# Patient Record
Sex: Male | Born: 1950
Health system: Southern US, Community
[De-identification: ages and names within clinical notes are randomized; demographics above are authoritative.]

## PROBLEM LIST (undated history)

## (undated) DIAGNOSIS — E785 Hyperlipidemia, unspecified: Secondary | ICD-10-CM

## (undated) DIAGNOSIS — N529 Male erectile dysfunction, unspecified: Secondary | ICD-10-CM

## (undated) DIAGNOSIS — M1990 Unspecified osteoarthritis, unspecified site: Secondary | ICD-10-CM

## (undated) DIAGNOSIS — K219 Gastro-esophageal reflux disease without esophagitis: Secondary | ICD-10-CM

## (undated) DIAGNOSIS — S022XXA Fracture of nasal bones, initial encounter for closed fracture: Secondary | ICD-10-CM

## (undated) DIAGNOSIS — I10 Essential (primary) hypertension: Secondary | ICD-10-CM

## (undated) DIAGNOSIS — B1920 Unspecified viral hepatitis C without hepatic coma: Secondary | ICD-10-CM

## (undated) HISTORY — DX: Fracture of nasal bones, initial encounter for closed fracture: S02.2XXA

## (undated) HISTORY — DX: Unspecified osteoarthritis, unspecified site: M19.90

## (undated) HISTORY — PX: INGUINAL HERNIA REPAIR: SHX194

## (undated) HISTORY — DX: Unspecified viral hepatitis C without hepatic coma: B19.20

## (undated) HISTORY — PX: FOOT SURGERY: SHX648

---

## 2011-08-10 DIAGNOSIS — R768 Other specified abnormal immunological findings in serum: Secondary | ICD-10-CM | POA: Insufficient documentation

## 2011-08-10 DIAGNOSIS — N411 Chronic prostatitis: Secondary | ICD-10-CM | POA: Insufficient documentation

## 2011-08-10 DIAGNOSIS — G5700 Lesion of sciatic nerve, unspecified lower limb: Secondary | ICD-10-CM | POA: Insufficient documentation

## 2011-08-10 DIAGNOSIS — M545 Low back pain: Secondary | ICD-10-CM | POA: Insufficient documentation

## 2011-11-27 DIAGNOSIS — B009 Herpesviral infection, unspecified: Secondary | ICD-10-CM | POA: Insufficient documentation

## 2012-02-28 LAB — LIPID PANEL
CHOLESTEROL: 144 mg/dL (ref 0–200)
HDL: 37 mg/dL (ref 35–70)
LDL Cholesterol: 83 mg/dL
TRIGLYCERIDES: 124 mg/dL (ref 40–160)

## 2012-02-28 LAB — HEPATIC FUNCTION PANEL
ALT: 36 U/L (ref 10–40)
AST: 37 U/L (ref 14–40)

## 2012-02-28 LAB — CBC AND DIFFERENTIAL
Hemoglobin: 18.1 g/dL — AB (ref 13.5–17.5)
Platelets: 129 10*3/uL — AB (ref 150–399)
WBC: 6.6 10^3/mL

## 2012-02-28 LAB — TSH: TSH: 1.45 u[IU]/mL (ref 0.41–5.90)

## 2012-02-28 LAB — BASIC METABOLIC PANEL
Creatinine: 1.1 mg/dL (ref 0.6–1.3)
Potassium: 4.4 mmol/L (ref 3.4–5.3)

## 2012-02-28 LAB — URIC ACID
GFR, Est African American: >60
GFR, Est Non African American: >60
Uric Acid: 7.9

## 2013-10-29 ENCOUNTER — Ambulatory Visit (INDEPENDENT_AMBULATORY_CARE_PROVIDER_SITE_OTHER): Payer: No Typology Code available for payment source | Admitting: Family Medicine

## 2013-10-29 ENCOUNTER — Encounter: Payer: Self-pay | Admitting: Family Medicine

## 2013-10-29 VITALS — BP 119/76 | HR 73 | Temp 97.9°F | Ht 67.6 in | Wt 188.0 lb

## 2013-10-29 DIAGNOSIS — E785 Hyperlipidemia, unspecified: Secondary | ICD-10-CM

## 2013-10-29 DIAGNOSIS — I10 Essential (primary) hypertension: Secondary | ICD-10-CM

## 2013-10-29 DIAGNOSIS — K649 Unspecified hemorrhoids: Secondary | ICD-10-CM

## 2013-10-29 DIAGNOSIS — K219 Gastro-esophageal reflux disease without esophagitis: Secondary | ICD-10-CM

## 2013-10-29 DIAGNOSIS — R252 Cramp and spasm: Secondary | ICD-10-CM

## 2013-10-29 DIAGNOSIS — N400 Enlarged prostate without lower urinary tract symptoms: Secondary | ICD-10-CM

## 2013-10-29 DIAGNOSIS — Z8619 Personal history of other infectious and parasitic diseases: Secondary | ICD-10-CM | POA: Insufficient documentation

## 2013-10-29 DIAGNOSIS — N529 Male erectile dysfunction, unspecified: Secondary | ICD-10-CM

## 2013-10-29 LAB — COMPLETE METABOLIC PANEL WITH GFR
ALBUMIN: 4.5 g/dL (ref 3.5–5.2)
ALT: 19 U/L (ref 0–53)
AST: 18 U/L (ref 0–37)
Alkaline Phosphatase: 106 U/L (ref 39–117)
BUN: 16 mg/dL (ref 6–23)
CALCIUM: 9.9 mg/dL (ref 8.4–10.5)
CHLORIDE: 97 meq/L (ref 96–112)
CO2: 28 mEq/L (ref 19–32)
Creat: 1.14 mg/dL (ref 0.50–1.35)
GFR, Est African American: 79 mL/min
GFR, Est Non African American: 68 mL/min
Glucose, Bld: 88 mg/dL (ref 70–99)
POTASSIUM: 3.9 meq/L (ref 3.5–5.3)
SODIUM: 134 meq/L — AB (ref 135–145)
Total Bilirubin: 0.6 mg/dL (ref 0.3–1.2)
Total Protein: 7.8 g/dL (ref 6.0–8.3)

## 2013-10-29 LAB — CBC
HEMATOCRIT: 43.4 % (ref 39.0–52.0)
HEMOGLOBIN: 14.8 g/dL (ref 13.0–17.0)
MCH: 24.7 pg — AB (ref 26.0–34.0)
MCHC: 34.1 g/dL (ref 30.0–36.0)
MCV: 72.5 fL — ABNORMAL LOW (ref 78.0–100.0)
Platelets: 163 10*3/uL (ref 150–400)
RBC: 5.99 MIL/uL — ABNORMAL HIGH (ref 4.22–5.81)
RDW: 16.4 % — ABNORMAL HIGH (ref 11.5–15.5)
WBC: 7.9 10*3/uL (ref 4.0–10.5)

## 2013-10-29 NOTE — Progress Notes (Signed)
Subjective:    Patient ID: Brandon [name], male    DOB: [birth year], 63 y.o.   MRN: [id number]  HPI Here to estab care.    Hypertension- Pt denies chest pain, SOB, dizziness, or heart palpitations.  Taking meds as directed w/o problems.  Denies medication side effects.    GERD - occ gets CP with it.  Using protonix for years.   Right sided flank pain x 1.5 years. . Says can be triggered by movement.  Can radiate in the axilla.  It can come on suddenly and can be very painful.  Rubbing it seems to help.  Does get a lot of cramping in his fee too.  History of GI ulcer.  Injured his back a few times while working for [employer]. Worked for them for almost 12 years.    Erectile dysfunction-saw his urologist last month. He then see him on Cialis 5 mg daily in addition to his Flomax for prostate and for return of function. He says the Cialis is costing him a most $200 out of pocket because his insurance doesn't cover it. So he's been taking it sporadically. He's been wondering why the medication doesn't seem to be working as well lately. He is having difficulty maintaining erection.  Review of Systems  HENT: Positive for hearing loss.   Cardiovascular: Positive for chest pain.  Gastrointestinal: Positive for blood in stool.  Genitourinary: Positive for flank pain.       Bleeding from hemorrhoid Urinates at night.  Leaking urine  Neurological: Positive for headaches.   BP 119/76  Pulse 73  Temp(Src) 97.9 F (36.6 C)  Ht 5' 7.6" (1.717 m)  Wt 188 lb (85.276 kg)  BMI 28.93 kg/m2    No Known Allergies  Past Medical History  Diagnosis Date  . Hepatitis C     [phone], treated at [facility]  . Nose fracture     Past Surgical History  Procedure Laterality Date  . Foot surgery Right     1980  . Foot surgery Left     1980  . Inguinal hernia repair Right     2009    History   Social History  . Marital Status: N/A    Spouse Name: Marzetta Merino    Number of Children: 2  . Years of  Education: N/A   Occupational History  . [employer]     Dept of [employer]   Social History Main Topics  . Smoking status: Former Smoker    Types: Cigarettes    Quit date: 10/22/1996  . Smokeless tobacco: Not on file  . Alcohol Use: No  . Drug Use: No  . Sexual Activity: Yes    Partners: Female   Other Topics Concern  . Not on file   Social History Narrative   No caffeine.  Occ exercise. Has a 2 years associate degree. Previously worked at [employer] for 12 years. Also work to [employer].    Family History  Problem Relation Age of Onset  . Breast cancer Sister   . Depression Sister   . Hyperlipidemia    . Ovarian cancer Sister   . Hypertension      Outpatient Encounter Prescriptions as of 10/29/2013  Medication Sig  . amLODipine (NORVASC) 5 MG tablet Take 5 mg by mouth daily.  [name] aspirin 81 MG tablet Take 81 mg by mouth daily.  . clindamycin (CLEOCIN T) 1 % lotion Apply topically 2 (two) times daily.  . clotrimazole-betamethasone (LOTRISONE) cream   .  cyanocobalamin 500 MCG tablet Take 500 mcg by mouth daily.  . flunisolide (NASALIDE) 25 MCG/ACT (0.025%) SOLN Place 2 sprays into the nose 2 (two) times daily.  . hydrocortisone (ANUSOL-HC) 25 MG suppository Place 25 mg rectally 2 (two) times daily.  . hydrocortisone 2.5 % cream Apply topically 2 (two) times daily.  [name] losartan-hydrochlorothiazide (HYZAAR) 100-25 MG per tablet   . Naftifine HCl 1 % GEL Apply topically.  . Omega-3 Fatty Acids (FISH OIL) 1200 MG CAPS Take by mouth.  . pantoprazole (PROTONIX) 40 MG tablet   . tadalafil (CIALIS) 5 MG tablet Take 5 mg by mouth daily as needed for erectile dysfunction.  . tamsulosin (FLOMAX) 0.4 MG CAPS capsule Take 0.4 mg by mouth.           Objective:   Physical Exam  Constitutional: He is oriented to person, place, and time. He appears well-developed and well-nourished.  HENT:  Head: Normocephalic and atraumatic.  Right Ear: External ear normal.  Left Ear:  External ear normal.  Eyes: Conjunctivae are normal.  Neck: Neck supple. No thyromegaly present.  Cardiovascular: Normal rate, regular rhythm and normal heart sounds.   No carotid bruits   Pulmonary/Chest: Effort normal and breath sounds normal.  Musculoskeletal: He exhibits no edema.  Nontender over the thoracic spine. Shoulders with normal range of motion. Normal rotation at the waist. Nontender over the right side under the axilla and over the ribs.  Lymphadenopathy:    He has no cervical adenopathy.  Neurological: He is alert and oriented to person, place, and time.  Skin: Skin is warm and dry.  Psychiatric: He has a normal mood and affect. His behavior is normal. Judgment and thought content normal.          Assessment & Plan:  Hypertension- well controlled. Continue current regimen. Followup in 6 months.  BPH-he does see urology. Last appointment was last month. He is currently on Cialis 5 mg daily as well as Flomax.  GERD - currently well controlled on pantoprazole. He admits occasionally dietary indiscretions to cause his symptoms to flare.  Right side pain - I. really think this could be coming from his thoracic spine or just part of the muscle cramping that he gets also in his hands and his feet. Encouraged him to try B6 daily to see if this makes a difference. Can also try magnesium daily. I will go ahead and check electrolytes as well as a CK as well.  msucle cramps - Will check Thyroid and CK. Recommend a trial of B6 and magnesium to see if this helps.  ED-discussed that Cialis is taken on a daily regimen works best when actually taken daily. It often takes 3-5 days of daily use to even see the actual effects of the medication. I suspect since she's been taking it sporadically to save money it is just not being as effective. We'll need to take daily. Since he does pay out of pocket for it I encouraged him to call around to different pharmacies that they do offer the  medication and different prices to see who offers the best price.

## 2013-10-29 NOTE — Patient Instructions (Signed)
You can try vitamin B6 and magnesium for the cramping and see if it makes a difference.

## 2013-10-30 LAB — FERRITIN: Ferritin: 15 ng/mL — ABNORMAL LOW (ref 22–322)

## 2013-10-30 LAB — CK: Total CK: 180 U/L (ref 7–232)

## 2013-10-30 LAB — TSH: TSH: 0.79 u[IU]/mL (ref 0.350–4.500)

## 2013-11-21 ENCOUNTER — Encounter: Payer: Self-pay | Admitting: Emergency Medicine

## 2013-11-21 ENCOUNTER — Emergency Department (INDEPENDENT_AMBULATORY_CARE_PROVIDER_SITE_OTHER)
Admission: EM | Admit: 2013-11-21 | Discharge: 2013-11-21 | Disposition: A | Discharge status: Home / Self Care | Payer: No Typology Code available for payment source | Source: Home / Self Care | Attending: Family Medicine | Admitting: Family Medicine

## 2013-11-21 DIAGNOSIS — R42 Dizziness and giddiness: Secondary | ICD-10-CM

## 2013-11-21 DIAGNOSIS — R5383 Other fatigue: Principal | ICD-10-CM

## 2013-11-21 DIAGNOSIS — R5381 Other malaise: Secondary | ICD-10-CM

## 2013-11-21 HISTORY — DX: Gastro-esophageal reflux disease without esophagitis: K21.9

## 2013-11-21 HISTORY — DX: Male erectile dysfunction, unspecified: N52.9

## 2013-11-21 HISTORY — DX: Essential (primary) hypertension: I10

## 2013-11-21 NOTE — ED Provider Notes (Signed)
CSN: [id number]     Arrival date & time 11/21/13  1307 History   First MD Initiated Contact with Patient 11/21/13 1336     Chief Complaint  Patient presents with  . Dizziness  . Weakness  . Nausea     HPI Comments: Patient complains of a 3 week history of occasional intermittent brief episodes of weakness, dizziness, nausea (without vomiting), ataxia, and and feeling incoordinated.  The episodes typically last about 5 to 10 minutes before subsiding.  He has been having 2 to 3 per week.  He sometimes has a vague sensation in his chest but he notes that he has a history of GERD.  Today he developed an episode about 3 hours ago that has been slower to resolve.  He is essentially assymptomatic at present. Family history includes hypertension in his mother and father, CHF in father; several cousins with heart problems.  The history is provided by the patient.    Past Medical History  Diagnosis Date  . Hepatitis C     [phone], treated at [facility]  . Nose fracture   . Hypertension   . GERD (gastroesophageal reflux disease)   . ED (erectile dysfunction)    Past Surgical History  Procedure Laterality Date  . Foot surgery Right     1980  . Foot surgery Left     1980  . Inguinal hernia repair Right     2009   Family History  Problem Relation Age of Onset  . Breast cancer Sister   . Depression Sister   . Hyperlipidemia    . Ovarian cancer Sister   . Hypertension     History  Substance Use Topics  . Smoking status: Former Smoker    Types: Cigarettes    Quit date: 10/22/1996  . Smokeless tobacco: Not on file  . Alcohol Use: No    Review of Systems  Constitutional: Negative.   HENT: Negative.   Eyes: Negative.   Respiratory: Negative.   Cardiovascular: Negative.   Gastrointestinal: Positive for nausea. Negative for vomiting.  Endocrine: Negative.   Genitourinary: Negative.   Musculoskeletal: Negative.   Skin: Negative.   Neurological: Positive for dizziness. Negative for  tremors, seizures, syncope, facial asymmetry, speech difficulty, weakness, light-headedness, numbness and headaches.    Allergies  Review of patient's allergies indicates no known allergies.  Home Medications   Current Outpatient Rx  Name  Route  Sig  Dispense  Refill  . amLODipine (NORVASC) 5 MG tablet   Oral   Take 5 mg by mouth daily.         [name] aspirin 81 MG tablet   Oral   Take 81 mg by mouth daily.         . clindamycin (CLEOCIN T) 1 % lotion   Topical   Apply topically 2 (two) times daily.         . clotrimazole-betamethasone (LOTRISONE) cream               . cyanocobalamin 500 MCG tablet   Oral   Take 500 mcg by mouth daily.         . flunisolide (NASALIDE) 25 MCG/ACT (0.025%) SOLN   Nasal   Place 2 sprays into the nose 2 (two) times daily.         . hydrocortisone (ANUSOL-HC) 25 MG suppository   Rectal   Place 25 mg rectally 2 (two) times daily.         . hydrocortisone 2.5 % cream  Topical   Apply topically 2 (two) times daily.         [name] losartan-hydrochlorothiazide (HYZAAR) 100-25 MG per tablet               . Naftifine HCl 1 % GEL   Apply externally   Apply topically.         . Omega-3 Fatty Acids (FISH OIL) 1200 MG CAPS   Oral   Take by mouth.         . pantoprazole (PROTONIX) 40 MG tablet               . tadalafil (CIALIS) 5 MG tablet   Oral   Take 5 mg by mouth daily as needed for erectile dysfunction.         . tamsulosin (FLOMAX) 0.4 MG CAPS capsule   Oral   Take 0.4 mg by mouth.          BP 125/74  Pulse 67  Temp(Src) 98 F (36.7 C) (Oral)  Resp 16  Ht 5' 7.5" (1.715 m)  Wt 187 lb 8 oz (85.049 kg)  BMI 28.92 kg/m2  SpO2 98% Physical Exam Nursing notes and Vital Signs reviewed. Appearance:  Patient appears healthy, stated age, and in no acute distress Eyes:  Pupils are equal, round, and reactive to light and accomodation.  Extraocular movement is intact.  Conjunctivae are not inflamed.  Fundi  are benign Ears:  Canals normal.  Tympanic membranes normal.  Nose:  Normal turbinates.  No sinus tenderness.    Mouth:  Normal Pharynx:  Normal Neck:  Supple.  No adenopathy.  No thyromegaly.  Carotids have normal upstrokes without bruits. Lungs:  Clear to auscultation.  Breath sounds are equal.  Heart:  Regular rate and rhythm without murmurs, rubs, or gallops.  Abdomen:  Nontender without masses or hepatosplenomegaly.  Bowel sounds are present.  No CVA or flank tenderness.  Extremities:  No edema.  No calf tenderness Skin:  No rash present.   Neurologic:  Cranial nerves 2 through 12 are normal.  Patellar, achilles, and elbow reflexes are normal.  Cerebellar function is intact (finger-to-nose and rapid alternating hand movement).  Gait and station are normal.  Negative cerebellar sign   ED Course  Procedures  None  EKG:   Rate 56 PR:  0.158 QT:  0.404 QR:  0.92 QRS axis:7 degrees Interpretation:  Sinus bradycardia; otherwise within normal limits          MDM   1. Other malaise and fatigue   2. Dizziness and giddiness; concerned about possible transient episodes of cerebrobasilar ischemia.    Continue low dose daily aspirin.  Continue Protonix. Recommend follow-up with PCP for further evaluation as soon as possible. If symptoms become significantly worse during the night or over the weekend, proceed to the local emergency room.   Kandra Nicolas, MD 11/23/13 [phone]

## 2013-11-21 NOTE — ED Notes (Signed)
Gives 3 week history of intermittent dizziness followed by weakness, slight tremors, and nausea. Denies chest pain or history of. Has had ECG within  past year.

## 2013-11-23 ENCOUNTER — Encounter: Payer: Self-pay | Admitting: Family Medicine

## 2013-11-23 DIAGNOSIS — K649 Unspecified hemorrhoids: Secondary | ICD-10-CM | POA: Insufficient documentation

## 2013-11-23 NOTE — Discharge Instructions (Signed)
Continue low dose daily aspirin.  Continue Protonix. If symptoms become significantly worse during the night or over the weekend, proceed to the local emergency room.

## 2013-11-26 ENCOUNTER — Encounter: Payer: Self-pay | Admitting: Family Medicine

## 2013-11-26 ENCOUNTER — Ambulatory Visit (INDEPENDENT_AMBULATORY_CARE_PROVIDER_SITE_OTHER): Payer: No Typology Code available for payment source | Admitting: Family Medicine

## 2013-11-26 VITALS — BP 112/71 | HR 76 | Temp 97.3°F | Ht 67.6 in | Wt 188.0 lb

## 2013-11-26 DIAGNOSIS — R42 Dizziness and giddiness: Secondary | ICD-10-CM

## 2013-11-26 DIAGNOSIS — R350 Frequency of micturition: Secondary | ICD-10-CM

## 2013-11-26 DIAGNOSIS — I10 Essential (primary) hypertension: Secondary | ICD-10-CM

## 2013-11-26 LAB — POCT UA - MICROALBUMIN
Albumin/Creatinine Ratio, Urine, POC: <30
CREATININE, POC: 100 mg/dL
MICROALBUMIN (UR) POC: 10 mg/L

## 2013-11-26 LAB — POCT GLYCOSYLATED HEMOGLOBIN (HGB A1C): HEMOGLOBIN A1C: 4.8

## 2013-11-26 NOTE — Progress Notes (Signed)
CC: Brandon [name] is a 63 y.o. male is here for Dizziness   Subjective: HPI:  Complains of episodes involving sensation of dizziness and entire body weakness that occur 2-3 times a week. This is been present for the last month and has not been getting better or worsens onset. They always occur when he is stationary and sitting usually at work. They come on gradually and will resolve after a few minutes however resolution is expedited with eating snacks from a vending machine. He works out most days of the week and denies having episodes during physical exertion. Describes dizziness only as dizziness and weakness or has weakness both mild in severity. He's never had this before. It can occur anytime of the day but occurs mostly at work.  Denies chest pain, shortness of breath, orthopnea, peripheral edema, decreased appetite, diarrhea, abdominal pain, fevers or chills, nor any other motor or sensory disturbances   Review Of Systems Outlined In HPI  Past Medical History  Diagnosis Date  . Hepatitis C     [phone], treated at [facility]  . Nose fracture   . Hypertension   . GERD (gastroesophageal reflux disease)   . ED (erectile dysfunction)     Past Surgical History  Procedure Laterality Date  . Foot surgery Right     1980  . Foot surgery Left     1980  . Inguinal hernia repair Right     2009   Family History  Problem Relation Age of Onset  . Breast cancer Sister   . Depression Sister   . Hyperlipidemia    . Ovarian cancer Sister   . Hypertension      History   Social History  . Marital Status: Married    Spouse Name: Jodi Kappes    Number of Children: 2  . Years of Education: N/A   Occupational History  . [employer] Rep     Dept of [employer]   Social History Main Topics  . Smoking status: Former Smoker    Types: Cigarettes    Quit date: 10/22/1996  . Smokeless tobacco: Not on file  . Alcohol Use: No  . Drug Use: No  . Sexual Activity: Yes   Partners: Female   Other Topics Concern  . Not on file   Social History Narrative   No caffeine.  Occ exercise. Has a 2 years associate degree. Previously worked at [employer] for 12 years. Also work to [employer].     Objective: BP 112/71  Pulse 76  Temp(Src) 97.3 F (36.3 C)  Ht 5' 7.6" (1.717 m)  Wt 188 lb (85.276 kg)  BMI 28.93 kg/m2  SpO2 98%  General: Alert and Oriented, No Acute Distress HEENT: Pupils equal, round, reactive to light. Conjunctivae clear.  External ears unremarkable, canals clear with intact TMs with appropriate landmarks.  Middle ear appears open without effusion. Pink inferior turbinates.  Moist mucous membranes, pharynx without inflammation nor lesions.  Neck supple without palpable lymphadenopathy nor abnormal masses. Lungs: Clear to auscultation bilaterally, no wheezing/ronchi/rales.  Comfortable work of breathing. Good air movement. Cardiac: Regular rate and rhythm. Normal S1/S2.  No murmurs, rubs, nor gallops.   Neuro: Cranial nerves II through XII grossly intact Extremities: No peripheral edema.  Strong peripheral pulses.  Mental Status: No depression, anxiety, nor agitation. Skin: Warm and dry.  Assessment & Plan: Markeith was seen today for dizziness.  Diagnoses and associated orders for this visit:  Dizzy - POCT glycosylated hemoglobin (Hb A1C)  Urinary frequency -  POCT UA - Microalbumin  Essential hypertension, benign    Dizziness: EKG reviewed from last week without any arrhythmia. A1c was obtained today which was unremarkable, spot blood sugar in January was unremarkable as well. Discussed low suspicion of hypoglycemic episodes, arrhythmia, or metabolic abnormality given recent objective labs and data. Suspect most likely cause would be relative hypotension at rest since symptoms do improve with ingestion of salty snacks. Decreasing amlodipine followup in the next 2-4 weeks.  Return in about 4 weeks (around 12/24/2013).

## 2013-12-03 ENCOUNTER — Encounter: Payer: Self-pay | Admitting: Family Medicine

## 2013-12-03 DIAGNOSIS — K7400 Hepatic fibrosis, unspecified: Secondary | ICD-10-CM | POA: Insufficient documentation

## 2013-12-03 DIAGNOSIS — B351 Tinea unguium: Secondary | ICD-10-CM | POA: Insufficient documentation

## 2013-12-03 DIAGNOSIS — E559 Vitamin D deficiency, unspecified: Secondary | ICD-10-CM | POA: Insufficient documentation

## 2013-12-03 DIAGNOSIS — E538 Deficiency of other specified B group vitamins: Secondary | ICD-10-CM | POA: Insufficient documentation

## 2013-12-03 DIAGNOSIS — M109 Gout, unspecified: Secondary | ICD-10-CM | POA: Insufficient documentation

## 2013-12-13 ENCOUNTER — Emergency Department (INDEPENDENT_AMBULATORY_CARE_PROVIDER_SITE_OTHER): Payer: No Typology Code available for payment source

## 2013-12-13 ENCOUNTER — Encounter: Payer: Self-pay | Admitting: Emergency Medicine

## 2013-12-13 ENCOUNTER — Emergency Department (INDEPENDENT_AMBULATORY_CARE_PROVIDER_SITE_OTHER)
Admission: EM | Admit: 2013-12-13 | Discharge: 2013-12-13 | Disposition: A | Discharge status: Home / Self Care | Payer: No Typology Code available for payment source | Source: Home / Self Care | Attending: Emergency Medicine | Admitting: Emergency Medicine

## 2013-12-13 DIAGNOSIS — M25569 Pain in unspecified knee: Secondary | ICD-10-CM

## 2013-12-13 DIAGNOSIS — S83419A Sprain of medial collateral ligament of unspecified knee, initial encounter: Secondary | ICD-10-CM

## 2013-12-13 MED ORDER — MELOXICAM 7.5 MG PO TABS: ORAL_TABLET | ORAL | Status: DC

## 2013-12-13 NOTE — ED Notes (Signed)
Pt twisted his left knee 2 days ago.  Used heat and ibuprofen yesterday with some relief.  Hurts to get up and to walk 7/10 pain described as sharp.  Denies previous injury.

## 2013-12-13 NOTE — ED Provider Notes (Signed)
CSN: [id number]     Arrival date & time 12/13/13  1343 History   First MD Initiated Contact with Patient 12/13/13 1400     Chief Complaint  Patient presents with  . Knee Injury    L    The history is provided by the patient.  Pt twisted his left knee 2 days ago. Used heat and ibuprofen yesterday with some relief. Hurts to get up and to walk 7/10 pain described as sharp. Denies previous injury.  The left knee pain is sharp, intensity 7/10. Exacerbated by movement. He is able to weight-bear. The knee has not given out. Ibuprofen 800 mg helps somewhat The pain does not radiate . No focal numbness or weakness Past Medical History  Diagnosis Date  . Hepatitis C     [phone], treated at [facility]  . Nose fracture   . Hypertension   . GERD (gastroesophageal reflux disease)   . ED (erectile dysfunction)    Past Surgical History  Procedure Laterality Date  . Foot surgery Right     1980  . Foot surgery Left     1980  . Inguinal hernia repair Right     2009   Family History  Problem Relation Age of Onset  . Breast cancer Sister   . Depression Sister   . Hyperlipidemia    . Ovarian cancer Sister   . Hypertension     History  Substance Use Topics  . Smoking status: Former Smoker    Types: Cigarettes    Quit date: 10/22/1996  . Smokeless tobacco: Not on file  . Alcohol Use: No    Review of Systems  All other systems reviewed and are negative.      Allergies  Review of patient's allergies indicates no known allergies.  Home Medications   Current Outpatient Rx  Name  Route  Sig  Dispense  Refill  . amLODipine (NORVASC) 5 MG tablet   Oral   Take 0.5 tablets (2.5 mg total) by mouth daily.   30 tablet   0   . aspirin 81 MG tablet   Oral   Take 81 mg by mouth daily.         . clindamycin (CLEOCIN T) 1 % lotion   Topical   Apply topically 2 (two) times daily.         . clotrimazole-betamethasone (LOTRISONE) cream               . cyanocobalamin 500 MCG  tablet   Oral   Take 500 mcg by mouth daily.         . flunisolide (NASALIDE) 25 MCG/ACT (0.025%) SOLN   Nasal   Place 2 sprays into the nose 2 (two) times daily.         . hydrocortisone (ANUSOL-HC) 25 MG suppository   Rectal   Place 25 mg rectally 2 (two) times daily.         . hydrocortisone 2.5 % cream   Topical   Apply topically 2 (two) times daily.         [name] losartan-hydrochlorothiazide (HYZAAR) 100-25 MG per tablet               . meloxicam (MOBIC) 7.5 MG tablet      Take 1 twice a day as needed for pain. Take with food. (Do not take with any other NSAID.)   20 tablet   0   . Naftifine HCl 1 % GEL   Apply externally   Apply  topically.         . Omega-3 Fatty Acids (FISH OIL) 1200 MG CAPS   Oral   Take by mouth.         . pantoprazole (PROTONIX) 40 MG tablet               . tadalafil (CIALIS) 5 MG tablet   Oral   Take 5 mg by mouth daily as needed for erectile dysfunction.         . tamsulosin (FLOMAX) 0.4 MG CAPS capsule   Oral   Take 0.4 mg by mouth.          BP 128/84  Pulse 79  Temp(Src) 98 F (36.7 C) (Oral)  Ht 5\' 7"  (1.702 m)  Wt 195 lb 4 oz (88.565 kg)  BMI 30.57 kg/m2  SpO2 99% Physical Exam  Nursing note and vitals reviewed. Constitutional: He is oriented to person, place, and time. He appears well-developed and well-nourished. No distress.  HENT:  Head: Normocephalic and atraumatic.  Eyes: Conjunctivae and EOM are normal. Pupils are equal, round, and reactive to light. No scleral icterus.  Neck: Normal range of motion.  Cardiovascular: Normal rate.   Pulmonary/Chest: Effort normal.  Abdominal: He exhibits no distension.  Musculoskeletal:       Left knee: He exhibits decreased range of motion and swelling. He exhibits no ecchymosis, no deformity, no laceration, no erythema, normal patellar mobility and no MCL laxity. Tenderness found. Medial joint line and MCL tenderness noted. No lateral joint line, no LCL and no  patellar tendon tenderness noted.  McMurray's sign negative. No instability. Lachman sign negative  Neurovascular distally intact  Neurological: He is alert and oriented to person, place, and time.  Skin: Skin is warm. No rash noted.  Psychiatric: He has a normal mood and affect.   No calf tenderness or swelling or cords or heat or edema ED Course  Procedures (including critical care time) Labs Review Labs Reviewed - No data to display Imaging Review Dg Knee Complete 4 Views Left  12/13/2013   CLINICAL DATA:  Twisting injury to the left knee 2 days ago, persistent pain.  EXAM: LEFT KNEE - COMPLETE 4+ VIEW  COMPARISON:  None.  FINDINGS: No evidence of acute fracture or dislocation. Well preserved joint spaces. Well preserved bone mineral density. Mild enthesopathic spurring at the insertion of the quadriceps tendon on the superior patella. Mild enthesopathic spurring at the insertion of the patellar tendon on the tibial tubercle and the inferior patella.  IMPRESSION: No acute or significant abnormality.   Electronically Signed   By: Evangeline Dakin M.D.   On: 12/13/2013 14:39      MDM   Final diagnoses:  Medial collateral ligament sprain of knee   left knee  X-ray left knee negative. No acute or significant bony abnormality  Treatment options discussed, as well as risks, benefits, alternatives. Patient voiced understanding and agreement with the following plans: Ice elevation rest Elastic knee sleeve Mobic for pain relief Followup with orthopedist if no better one week, sooner if worse or new symptoms Precautions discussed. Red flags discussed. Questions invited and answered. Patient voiced understanding and agreement.     Jacqulyn Cane, MD 12/13/13 [phone]

## 2014-01-06 ENCOUNTER — Encounter: Payer: Self-pay | Admitting: *Deleted

## 2014-02-04 ENCOUNTER — Ambulatory Visit (INDEPENDENT_AMBULATORY_CARE_PROVIDER_SITE_OTHER): Payer: No Typology Code available for payment source | Admitting: Family Medicine

## 2014-02-04 ENCOUNTER — Encounter: Payer: Self-pay | Admitting: Family Medicine

## 2014-02-04 VITALS — BP 119/68 | HR 63 | Wt 191.0 lb

## 2014-02-04 DIAGNOSIS — R3915 Urgency of urination: Secondary | ICD-10-CM

## 2014-02-04 DIAGNOSIS — M549 Dorsalgia, unspecified: Secondary | ICD-10-CM

## 2014-02-04 LAB — POCT URINALYSIS DIPSTICK
Bilirubin, UA: NEGATIVE
Blood, UA: NEGATIVE
Glucose, UA: NEGATIVE
Ketones, UA: NEGATIVE
Leukocytes, UA: NEGATIVE
NITRITE UA: NEGATIVE
PH UA: 6.5
Protein, UA: NEGATIVE
Spec Grav, UA: 1.015
Urobilinogen, UA: 0.2

## 2014-02-04 NOTE — Progress Notes (Signed)
   Subjective:    Patient ID: Brandon [name], male    DOB: 07/13/1951, 63 y.o.   MRN: [id number]  HPI Has had some low back pain for 4 days.  Had noticed some inc urination. Yesterday was goind every 5 min. Says does drinks a lot of water.  Says no blood in the urine. No fever, chills, or sweats. No hx of renal stones. Hx of BPH.   Says that he says the urinary frequency is actually a lot better today. The back pain is also significantly better but he still just a little sore.   Review of Systems     Objective:   Physical Exam  Constitutional: He is oriented to person, place, and time. He appears well-developed and well-nourished.  HENT:  Head: Normocephalic and atraumatic.  Abdominal: Soft. Bowel sounds are normal. He exhibits no distension and no mass. There is no tenderness. There is no rebound and no guarding.  Musculoskeletal:  Lump lumbar spine with normal flexion, extension, rotation right and left, side bending. He is a little bit tender of the very lower end of the lumbar spine and also little bit tender just lateral to the SI joint on the left side. Negative straight leg raise bilaterally. Hip, knee, ankle strength is 5 out of 5 bilaterally. Patellar reflexes 1+ bilaterally.  Neurological: He is alert and oriented to person, place, and time.  Skin: Skin is warm and dry.  Psychiatric: He has a normal mood and affect. His behavior is normal.          Assessment & Plan:  Low back pain- he actually has some tenderness on exam. Suspect this is more musculoskeletal though there could be related to the urinary frequency. He has not had any severe pain or blood in the urine to suggest a renal stone. He's not had any changes with his bowel movements. Urinalysis was negative today. We'll send for culture for confirmation. Consider this could also be some mild prostatitis, they usually it does not improve as quickly.  Urinary urgency-unclear etiology. It seems to have improved  significantly today. Consider kidney stone versus prostatitis versus UTI. His urine dipstick was negative but we will send for culture for further evaluation.

## 2014-03-27 ENCOUNTER — Encounter: Payer: Self-pay | Admitting: Emergency Medicine

## 2014-03-27 ENCOUNTER — Emergency Department (INDEPENDENT_AMBULATORY_CARE_PROVIDER_SITE_OTHER)
Admission: EM | Admit: 2014-03-27 | Discharge: 2014-03-27 | Disposition: A | Discharge status: Home / Self Care | Payer: No Typology Code available for payment source | Source: Home / Self Care | Attending: Family Medicine | Admitting: Family Medicine

## 2014-03-27 DIAGNOSIS — R300 Dysuria: Secondary | ICD-10-CM

## 2014-03-27 DIAGNOSIS — N4889 Other specified disorders of penis: Secondary | ICD-10-CM

## 2014-03-27 DIAGNOSIS — N489 Disorder of penis, unspecified: Secondary | ICD-10-CM

## 2014-03-27 LAB — POCT URINALYSIS DIP (MANUAL ENTRY)
Bilirubin, UA: NEGATIVE
Glucose, UA: NEGATIVE
Ketones, POC UA: NEGATIVE
Leukocytes, UA: NEGATIVE
Nitrite, UA: NEGATIVE
Protein Ur, POC: NEGATIVE
RBC UA: NEGATIVE
Spec Grav, UA: 1.01
Urobilinogen, UA: 0.2
pH, UA: 6

## 2014-03-27 NOTE — ED Notes (Signed)
Pt complains of rash on his penis started 1 week ago.  Described as red raised and sometimes itches. Denies new sexual partners. Says frequency of urination has increased but does not hurt.  Says he "feels his urine coming out" more than usual almost a burning.

## 2014-03-27 NOTE — ED Provider Notes (Signed)
CSN: [id number]     Arrival date & time 03/27/14  1230 History   First MD Initiated Contact with Patient 03/27/14 1248     Chief Complaint  Patient presents with  . Rash    penis      HPI Comments: One week ago patient noticed three small painless bumps on his penis (they do itch at times). They were not vesicles initially, and have not increased in size.  Clotrimazole cream has had no effect.  He has a past history of genital warts while in the [place] that were treated with cryotherapy.  He also complains of mild urinary frequency and minimal dysuria.  No urethral discharge.  He states that he has had similar urine symptoms in past but urinalysis has always been negative.  He states that about one month ago his [place] started him on prophylactic acyclovir 800mg , one-half tab daily.  He feels well otherwise.  He denies new sexual partners.  Patient is a 63 y.o. male presenting with rash. The history is provided by the patient.  Rash Pain location: penis. Pain quality comment:  Itching Pain severity:  Mild Onset quality:  Gradual Duration:  1 week Timing:  Constant Chronicity:  New Relieved by:  Nothing Worsened by:  Nothing tried Ineffective treatments: clotrimazole cream. Associated symptoms: no chills, no fatigue, no hematuria and no nausea   Associated symptoms comment:  Mild dysuria   Past Medical History  Diagnosis Date  . Hepatitis C     [phone], treated at [facility]  . Nose fracture   . Hypertension   . GERD (gastroesophageal reflux disease)   . ED (erectile dysfunction)    Past Surgical History  Procedure Laterality Date  . Foot surgery Right     1980  . Foot surgery Left     1980  . Inguinal hernia repair Right     2009   Family History  Problem Relation Age of Onset  . Breast cancer Sister   . Depression Sister   . Hyperlipidemia    . Ovarian cancer Sister   . Hypertension     History  Substance Use Topics  . Smoking status: Former Smoker   Types: Cigarettes    Quit date: 10/22/1996  . Smokeless tobacco: Not on file  . Alcohol Use: No    Review of Systems  Constitutional: Negative for chills and fatigue.  Gastrointestinal: Negative for nausea.  Genitourinary: Negative for hematuria.  Skin: Positive for rash.    Allergies  Review of patient's allergies indicates no known allergies.  Home Medications   Prior to Admission medications   Medication Sig Start Date End Date Taking? Authorizing Provider  acyclovir (ZOVIRAX) 800 MG tablet  01/14/14   Historical Provider, MD  AMBULATORY NON FORMULARY MEDICATION Medication Name: Metabolix co Q 10    Historical Provider, MD  AMBULATORY NON FORMULARY MEDICATION Medication Name: vit D with probiotic    Historical Provider, MD  AMBULATORY NON FORMULARY MEDICATION Medication Name: daily defense    Historical Provider, MD  amLODipine (NORVASC) 5 MG tablet Take 0.5 tablets (2.5 mg total) by mouth daily. 11/26/13   Marcial Pacas, DO  aspirin 81 MG tablet Take 81 mg by mouth daily.    Historical Provider, MD  clindamycin (CLEOCIN T) 1 % lotion Apply topically 2 (two) times daily.    Historical Provider, MD  clotrimazole-betamethasone (LOTRISONE) cream  09/22/13   Historical Provider, MD  cyanocobalamin 500 MCG tablet Take 500 mcg by mouth daily.  Historical Provider, MD  flunisolide (NASALIDE) 25 MCG/ACT (0.025%) SOLN Place 2 sprays into the nose 2 (two) times daily.    Historical Provider, MD  hydrocortisone (ANUSOL-HC) 25 MG suppository Place 25 mg rectally 2 (two) times daily.    Historical Provider, MD  hydrocortisone 2.5 % cream Apply topically 2 (two) times daily.    Historical Provider, MD  losartan-hydrochlorothiazide Konrad Penta) 100-25 MG per tablet  09/15/13   Historical Provider, MD  meloxicam (MOBIC) 7.5 MG tablet Take 1 twice a day as needed for pain. Take with food. (Do not take with any other NSAID.) 12/13/13   Jacqulyn Cane, MD  Naftifine HCl 1 % GEL Apply topically.    Historical  Provider, MD  Omega-3 Fatty Acids (FISH OIL) 1200 MG CAPS Take by mouth.    Historical Provider, MD  pantoprazole (PROTONIX) 40 MG tablet  09/15/13   Historical Provider, MD  tadalafil (CIALIS) 5 MG tablet Take 5 mg by mouth daily as needed for erectile dysfunction.    Historical Provider, MD  tamsulosin (FLOMAX) 0.4 MG CAPS capsule Take 0.4 mg by mouth.    Historical Provider, MD   BP 131/77  Pulse 59  Temp(Src) 97.9 F (36.6 C) (Oral)  Ht 5' 7.5" (1.715 m)  Wt 187 lb (84.823 kg)  BMI 28.84 kg/m2  SpO2 99% Physical Exam  Nursing note and vitals reviewed. Constitutional: He is oriented to person, place, and time. He appears well-developed and well-nourished. No distress.  HENT:  Head: Atraumatic.  Eyes: Conjunctivae are normal. Pupils are equal, round, and reactive to light.  Genitourinary: Testes normal.    No penile erythema or penile tenderness. No discharge found.  Distal penile shaft has three small 23mm diameter smooth flat- topped lesions without erythema or tenderness.  Neurological: He is alert and oriented to person, place, and time.  Skin: Skin is warm and dry.    ED Course  Procedures  none    Labs Reviewed  GC/CHLAMYDIA PROBE AMP, URINE  POCT URINALYSIS DIP (MANUAL ENTRY):  negative         MDM   1. Dysuria; note normal urinalysis  2. Penile lesion; ?early genital warts (lesions too small and non-specific to definitively diagnose)   Patient reassured that lesions are not HSV.   Monitor lesions for now.  If bumps increase in size or new ones appear, followup with dermatologist or PCP for cryotherapy. GC/chlamydia pending    Kandra Nicolas, MD 03/27/14 [phone]

## 2014-03-27 NOTE — Discharge Instructions (Signed)
Monitor lesions for now.  If bumps increase in size or new ones appear, followup with dermatologist or PCP for cryotherapy.

## 2014-03-29 ENCOUNTER — Telehealth: Payer: Self-pay | Admitting: *Deleted

## 2014-03-29 LAB — GC/CHLAMYDIA PROBE AMP, URINE
Chlamydia, Swab/Urine, PCR: NEGATIVE
GC Probe Amp, Urine: NEGATIVE

## 2014-04-14 ENCOUNTER — Other Ambulatory Visit: Payer: Self-pay

## 2014-04-14 MED ORDER — PANTOPRAZOLE SODIUM 40 MG PO TBEC: 40.0000 mg | DELAYED_RELEASE_TABLET | Freq: Every day | ORAL | Status: DC

## 2014-04-14 MED ORDER — LOSARTAN POTASSIUM-HCTZ 100-25 MG PO TABS: 1.0000 | ORAL_TABLET | Freq: Every day | ORAL | Status: DC

## 2014-04-14 MED ORDER — AMLODIPINE BESYLATE 5 MG PO TABS: 5.0000 mg | ORAL_TABLET | Freq: Every day | ORAL | Status: DC

## 2014-04-15 ENCOUNTER — Encounter: Payer: Self-pay | Admitting: Family Medicine

## 2014-04-15 ENCOUNTER — Ambulatory Visit (INDEPENDENT_AMBULATORY_CARE_PROVIDER_SITE_OTHER): Payer: No Typology Code available for payment source | Admitting: Family Medicine

## 2014-04-15 VITALS — BP 118/69 | HR 61 | Ht 67.6 in | Wt 192.0 lb

## 2014-04-15 DIAGNOSIS — J309 Allergic rhinitis, unspecified: Secondary | ICD-10-CM

## 2014-04-15 DIAGNOSIS — K2100 Gastro-esophageal reflux disease with esophagitis, without bleeding: Secondary | ICD-10-CM

## 2014-04-15 DIAGNOSIS — I10 Essential (primary) hypertension: Secondary | ICD-10-CM

## 2014-04-15 NOTE — Patient Instructions (Signed)
Food Choices for Gastroesophageal Reflux Disease When you have gastroesophageal reflux disease (GERD), the foods you eat and your eating habits are very important. Choosing the right foods can help ease the discomfort of GERD. WHAT GENERAL GUIDELINES DO I NEED TO FOLLOW?  Choose fruits, vegetables, whole grains, low-fat dairy products, and low-fat meat, fish, and poultry.  Limit fats such as oils, salad dressings, butter, nuts, and avocado.  Keep a food diary to identify foods that cause symptoms.  Avoid foods that cause reflux. These may be different for different people.  Eat frequent small meals instead of three large meals each day.  Eat your meals slowly, in a relaxed setting.  Limit fried foods.  Cook foods using methods other than frying.  Avoid drinking alcohol.  Avoid drinking large amounts of liquids with your meals.  Avoid bending over or lying down until 2-3 hours after eating. WHAT FOODS ARE NOT RECOMMENDED? The following are some foods and drinks that may worsen your symptoms: Vegetables Tomatoes. Tomato juice. Tomato and spaghetti sauce. Chili peppers. Onion and garlic. Horseradish. Fruits Oranges, grapefruit, and lemon (fruit and juice). Meats High-fat meats, fish, and poultry. This includes hot dogs, ribs, ham, sausage, salami, and bacon. Dairy Whole milk and chocolate milk. Sour cream. Cream. Butter. Ice cream. Cream cheese.  Beverages Coffee and tea, with or without caffeine. Carbonated beverages or energy drinks. Condiments Hot sauce. Barbecue sauce.  Sweets/Desserts Chocolate and cocoa. Donuts. Peppermint and spearmint. Fats and Oils High-fat foods, including French fries and potato chips. Other Vinegar. Strong spices, such as black pepper, white pepper, red pepper, cayenne, curry powder, cloves, ginger, and chili powder. The items listed above may not be a complete list of foods and beverages to avoid. Contact your dietitian for more  information. Document Released: 10/08/2005 Document Revised: 10/13/2013 Document Reviewed: 08/12/2013 ExitCare Patient Information 2015 [employer]. This information is not intended to replace advice given to you by your health care Brandon [name]. Make sure you discuss any questions you have with your health care Brandon [name].  

## 2014-04-15 NOTE — Progress Notes (Signed)
Subjective:    Patient ID: Brandon [name], male    DOB: June 26, 1951, 63 y.o.   MRN: [id number]  HPI Hypertension- Pt denies chest pain, SOB, dizziness, or heart palpitations.  Taking meds as directed w/o problems.  Denies medication side effects.    GERD- taking his protonix occasionally. He was taking it daily but stopped a few months ago.. sys getting more acid reflux after he eats.  He has been eating some spicy foods recently. No alleviating factors.  He has had some nasal congestion, post nasal drip and coughing at night.  Thinks it may be allergies. Had been using generic flonase occasionally but says it burns his nose so doesn't like to use it consistently. He says he was worse but actually feels a little bit better this week. No fevers chills or sweats.  Review of Systems     Objective:   Physical Exam  Constitutional: He is oriented to person, place, and time. He appears well-developed and well-nourished.  HENT:  Head: Normocephalic and atraumatic.  Right Ear: External ear normal.  Left Ear: External ear normal.  Nose: Nose normal.  Mouth/Throat: Oropharynx is clear and moist.  TMs and canals are clear.   Eyes: Conjunctivae and EOM are normal. Pupils are equal, round, and reactive to light.  Neck: Neck supple. No thyromegaly present.  Cardiovascular: Normal rate and normal heart sounds.   Pulmonary/Chest: Effort normal and breath sounds normal.  Lymphadenopathy:    He has no cervical adenopathy.  Neurological: He is alert and oriented to person, place, and time.  Skin: Skin is warm and dry.  Psychiatric: He has a normal mood and affect.    BP 118/69  Pulse 61  Ht 5' 7.6" (1.717 m)  Wt 192 lb (87.091 kg)  BMI 29.54 kg/m2    No Known Allergies  Past Medical History  Diagnosis Date  . Hepatitis C     [phone], treated at [facility]  . Nose fracture   . Hypertension   . GERD (gastroesophageal reflux disease)   . ED (erectile dysfunction)     Past Surgical  History  Procedure Laterality Date  . Foot surgery Right     1980  . Foot surgery Left     1980  . Inguinal hernia repair Right     2009    History   Social History  . Marital Status: Married    Spouse Name: Tami Barren    Number of Children: 2  . Years of Education: N/A   Occupational History  . [employer] Rep     Dept of [employer] in [place] Topics  . Smoking status: Former Smoker    Types: Cigarettes    Quit date: 10/22/1996  . Smokeless tobacco: Not on file  . Alcohol Use: No  . Drug Use: No  . Sexual Activity: Yes    Partners: Female   Other Topics Concern  . Not on file   Social History Narrative   No caffeine.  Occ exercise. Has a 2 years associate degree. Previously worked at [employer] for 12 years. Also work to [employer].    Family History  Problem Relation Age of Onset  . Breast cancer Sister   . Depression Sister   . Hyperlipidemia    . Ovarian cancer Sister   . Hypertension      Outpatient Encounter Prescriptions as of 04/15/2014  Medication Sig  . acyclovir (ZOVIRAX) 800 MG tablet   . AMBULATORY NON  FORMULARY MEDICATION Medication Name: Metabolix co Q 10  . AMBULATORY NON FORMULARY MEDICATION Medication Name: vit D with probiotic  . AMBULATORY NON FORMULARY MEDICATION Medication Name: daily defense  . amLODipine (NORVASC) 5 MG tablet Take 1 tablet (5 mg total) by mouth daily.  [name] aspirin 81 MG tablet Take 81 mg by mouth daily.  . clindamycin (CLEOCIN T) 1 % lotion Apply topically 2 (two) times daily.  . clotrimazole-betamethasone (LOTRISONE) cream   . cyanocobalamin 500 MCG tablet Take 500 mcg by mouth daily.  . diclofenac (CATAFLAM) 50 MG tablet   . doxycycline (MONODOX) 100 MG capsule   . flunisolide (NASALIDE) 25 MCG/ACT (0.025%) SOLN Place 2 sprays into the nose 2 (two) times daily.  . hydrocortisone (ANUSOL-HC) 25 MG suppository Place 25 mg rectally 2 (two) times daily.  . hydrocortisone 2.5 % cream Apply topically  2 (two) times daily.  [name] losartan-hydrochlorothiazide (HYZAAR) 100-25 MG per tablet Take 1 tablet by mouth daily.  . meloxicam (MOBIC) 7.5 MG tablet Take 1 twice a day as needed for pain. Take with food. (Do not take with any other NSAID.)  . Naftifine HCl 1 % GEL Apply topically.  . Omega-3 Fatty Acids (FISH OIL) 1200 MG CAPS Take by mouth.  . pantoprazole (PROTONIX) 40 MG tablet Take 1 tablet (40 mg total) by mouth daily.  . tadalafil (CIALIS) 5 MG tablet Take 5 mg by mouth daily as needed for erectile dysfunction.  . tamsulosin (FLOMAX) 0.4 MG CAPS capsule Take 0.4 mg by mouth.            Assessment & Plan:  Hypertension-well-controlled on current regimen. Continue current regimen. Followup in 6 months. Due for CMP and fasting lipid panel.  Allergic rhinitis - since he doesn't tolerate nasal steroid sprays for a well then recommend an oral antihistamine such as Allegra 180 mg once a day. Make sure to avoid any product with the decongestant as this can raise blood pressure.  GERD- uncontrolled right now. Recommend he restart his PPI daily for 4-6 weeks. At that point he can decrease to every other day. We also discussed dietary changes to control his reflux. Handout provided.  Dsicussed need for shingles vaccine.

## 2014-05-10 ENCOUNTER — Telehealth: Payer: Self-pay | Admitting: *Deleted

## 2014-05-10 NOTE — Telephone Encounter (Signed)
Pt called and stated that he was told to call back if he was not feeling (sinuses) any better and he is not. Will forward to Dr. Madilyn Fireman for f/u.Brandon [name]

## 2014-05-11 MED ORDER — AMOXICILLIN-POT CLAVULANATE 875-125 MG PO TABS: 1.0000 | ORAL_TABLET | Freq: Two times a day (BID) | ORAL | Status: DC

## 2014-05-11 NOTE — Telephone Encounter (Signed)
-  Never prescription for Augmentin to [facility]. It is an antibiotic.

## 2014-05-12 NOTE — Telephone Encounter (Signed)
Pt informed.Brandon [name] [name]  

## 2014-06-09 ENCOUNTER — Encounter: Payer: Self-pay | Admitting: Physician Assistant

## 2014-06-09 ENCOUNTER — Ambulatory Visit (INDEPENDENT_AMBULATORY_CARE_PROVIDER_SITE_OTHER): Payer: No Typology Code available for payment source | Admitting: Physician Assistant

## 2014-06-09 VITALS — BP 109/66 | HR 67 | Temp 97.9°F | Wt 191.0 lb

## 2014-06-09 DIAGNOSIS — R05 Cough: Secondary | ICD-10-CM

## 2014-06-09 DIAGNOSIS — R059 Cough, unspecified: Secondary | ICD-10-CM

## 2014-06-09 DIAGNOSIS — R058 Other specified cough: Secondary | ICD-10-CM

## 2014-06-09 MED ORDER — ALBUTEROL SULFATE (2.5 MG/3ML) 0.083% IN NEBU
2.5000 mg | INHALATION_SOLUTION | Freq: Once | RESPIRATORY_TRACT | Status: AC
  Administered 2014-06-09: 2.5 mg via RESPIRATORY_TRACT

## 2014-06-09 MED ORDER — ALBUTEROL SULFATE HFA 108 (90 BASE) MCG/ACT IN AERS: 2.0000 | INHALATION_SPRAY | Freq: Four times a day (QID) | RESPIRATORY_TRACT | Status: DC | PRN

## 2014-06-09 MED ORDER — PREDNISONE 50 MG PO TABS: ORAL_TABLET | ORAL | Status: DC

## 2014-06-09 MED ORDER — HYDROCODONE-HOMATROPINE 5-1.5 MG/5ML PO SYRP: 5.0000 mL | ORAL_SOLUTION | Freq: Every evening | ORAL | Status: DC | PRN

## 2014-06-09 NOTE — Progress Notes (Signed)
   Subjective:    Patient ID: Brandon [name], male    DOB: 1950/11/20, 63 y.o.   MRN: [id number]  HPI Pt presents to the clinic with cough, SOB for last 2 weeks. Finished augmentin about 1 week ago for sinus infection. He admits to feeling much better today but cough still present. He leaves for vacation this week and did not want to get sick. Continues to take ibuprofen, increased hydration and taking singulair. Cough is dry and not productive. Sinus pressure has improved significantly.     Review of Systems  All other systems reviewed and are negative.      Objective:   Physical Exam  Constitutional: He is oriented to person, place, and time. He appears well-developed and well-nourished.  HENT:  Head: Normocephalic and atraumatic.  Right Ear: External ear normal.  Left Ear: External ear normal.  Mouth/Throat: Oropharynx is clear and moist.  TM's clear bilaterally.  Bilateral nasal turbinates red and swollen.  Negative for any maxillary or frontal sinus tenderness or pressure.   Eyes: Conjunctivae are normal. Right eye exhibits no discharge. Left eye exhibits no discharge.  Neck: Normal range of motion. Neck supple.  Cardiovascular: Normal rate, regular rhythm and normal heart sounds.   Pulmonary/Chest: Effort normal and breath sounds normal. He has no wheezes.  Dry hacking cough.   Lymphadenopathy:    He has no cervical adenopathy.  Neurological: He is alert and oriented to person, place, and time.  Skin: Skin is dry.  Psychiatric: He has a normal mood and affect. His behavior is normal.          Assessment & Plan:  Cough/post-viral cough- reassured pt since improving that cough is due to inflammation, PND and resolving infection. Albuterol nebulizer given in office today he did report some improvement. Since leaving for weekend and could be some asthmatic feature in cough. Treated with 5 days of prednisone. Hycodan syrup given for cough at bedtime. Delsym for cough during  the day. flonase daily for any nasal congestion/inflammation. If not improving call office.

## 2014-06-11 ENCOUNTER — Ambulatory Visit: Payer: No Typology Code available for payment source | Admitting: Physician Assistant

## 2014-06-29 ENCOUNTER — Emergency Department (INDEPENDENT_AMBULATORY_CARE_PROVIDER_SITE_OTHER)
Admission: EM | Admit: 2014-06-29 | Discharge: 2014-06-29 | Disposition: A | Discharge status: Home / Self Care | Payer: No Typology Code available for payment source | Source: Home / Self Care

## 2014-06-29 ENCOUNTER — Encounter: Payer: Self-pay | Admitting: Emergency Medicine

## 2014-06-29 ENCOUNTER — Emergency Department (INDEPENDENT_AMBULATORY_CARE_PROVIDER_SITE_OTHER): Payer: No Typology Code available for payment source

## 2014-06-29 DIAGNOSIS — M25473 Effusion, unspecified ankle: Secondary | ICD-10-CM

## 2014-06-29 DIAGNOSIS — M79674 Pain in right toe(s): Secondary | ICD-10-CM

## 2014-06-29 DIAGNOSIS — M10071 Idiopathic gout, right ankle and foot: Secondary | ICD-10-CM

## 2014-06-29 DIAGNOSIS — M25476 Effusion, unspecified foot: Secondary | ICD-10-CM

## 2014-06-29 DIAGNOSIS — M109 Gout, unspecified: Secondary | ICD-10-CM

## 2014-06-29 MED ORDER — TRAMADOL HCL 50 MG PO TABS: ORAL_TABLET | ORAL | Status: DC

## 2014-06-29 MED ORDER — PREDNISONE 20 MG PO TABS
ORAL_TABLET | ORAL | Status: DC
Start: 2014-06-29 — End: 2014-07-14

## 2014-06-29 NOTE — ED Provider Notes (Signed)
CSN: [id number]     Arrival date & time 06/29/14  1225 History   None    Chief Complaint  Patient presents with  . Toe Pain    right great toe   (Consider location/radiation/quality/duration/timing/severity/associated sxs/prior Treatment) HPI Pt is a 63 yo male who presents to the clinic with his with c/o right great toe swelling and pain that started last night. Problem has been intermittent for over 1 year off and on. He has been treated for acute gout but uric acid levels have always been normal and never been given any preventative. Not had x-rays. Started after being treated with "chemo-pill" for his hepatitis at [facility]. Pain 10/10 today. Not done anything to make better. Hurts for anything to touch right great toe. No known injury. Does admit to eating a lot of meat and shrimp.   Past Medical History  Diagnosis Date  . Hepatitis C     [phone], [facility] at [facility]  . Nose fracture   . Hypertension   . GERD (gastroesophageal reflux disease)   . ED (erectile dysfunction)    Past Surgical History  Procedure Laterality Date  . Foot surgery Right     1980  . Foot surgery Left     1980  . Inguinal hernia repair Right     2009   Family History  Problem Relation Age of Onset  . Breast cancer Sister   . Depression Sister   . Hyperlipidemia    . Ovarian cancer Sister   . Hypertension     History  Substance Use Topics  . Smoking status: Former Smoker    Types: Cigarettes    Quit date: 10/22/1996  . Smokeless tobacco: Never Used  . Alcohol Use: No    Review of Systems  All other systems reviewed and are negative.   Allergies  Review of patient's allergies indicates no known allergies.  Home Medications   Prior to Admission medications   Medication Sig Start Date End Date Taking? Authorizing Provider  acyclovir (ZOVIRAX) 800 MG tablet  01/14/14   Historical Provider, MD  albuterol (PROVENTIL HFA;VENTOLIN HFA) 108 (90 BASE) MCG/ACT inhaler Inhale 2 puffs into the lungs  every 6 (six) hours as needed for wheezing or shortness of breath. 06/09/14   Donella Stade, PA-C  AMBULATORY NON FORMULARY MEDICATION Medication Name: Metabolix co Q 10    Historical Provider, MD  AMBULATORY NON FORMULARY MEDICATION Medication Name: vit D with probiotic    Historical Provider, MD  AMBULATORY NON FORMULARY MEDICATION Medication Name: daily defense    Historical Provider, MD  amLODipine (NORVASC) 5 MG tablet Take 1 tablet (5 mg total) by mouth daily. 04/14/14   Hali Marry, MD  aspirin 81 MG tablet Take 81 mg by mouth daily.    Historical Provider, MD  clindamycin (CLEOCIN T) 1 % lotion Apply topically 2 (two) times daily.    Historical Provider, MD  clotrimazole-betamethasone (LOTRISONE) cream  09/22/13   Historical Provider, MD  cyanocobalamin 500 MCG tablet Take 500 mcg by mouth daily.    Historical Provider, MD  diclofenac (CATAFLAM) 50 MG tablet  04/06/14   Historical Provider, MD  flunisolide (NASALIDE) 25 MCG/ACT (0.025%) SOLN Place 2 sprays into the nose 2 (two) times daily.    Historical Provider, MD  HYDROcodone-homatropine (HYCODAN) 5-1.5 MG/5ML syrup Take 5 mLs by mouth at bedtime as needed. 06/09/14   Tranika Scholler L Chevy Virgo, PA-C  hydrocortisone (ANUSOL-HC) 25 MG suppository Place 25 mg rectally 2 (two) times daily.  Historical Provider, MD  hydrocortisone 2.5 % cream Apply topically 2 (two) times daily.    Historical Provider, MD  losartan-hydrochlorothiazide (HYZAAR) 100-25 MG per tablet Take 1 tablet by mouth daily. 04/14/14   Hali Marry, MD  meloxicam (MOBIC) 7.5 MG tablet Take 1 twice a day as needed for pain. Take with food. (Do not take with any other NSAID.) 12/13/13   Jacqulyn Cane, MD  Naftifine HCl 1 % GEL Apply topically.    Historical Provider, MD  Omega-3 Fatty Acids (FISH OIL) 1200 MG CAPS Take by mouth.    Historical Provider, MD  pantoprazole (PROTONIX) 40 MG tablet Take 1 tablet (40 mg total) by mouth daily. 04/14/14   Hali Marry, MD   predniSONE (DELTASONE) 20 MG tablet Take 3 tabs for 3 days, Take 2 tablets for 3 days, take 1 tablet for 3 days, take 1/2 tablet for 4 days. 06/29/14   Mandela Bello L Ashleynicole Mcclees, PA-C  predniSONE (DELTASONE) 50 MG tablet Take one tablet for 5 days. 06/09/14   Yalexa Blust L Ekam Besson, PA-C  tadalafil (CIALIS) 5 MG tablet Take 5 mg by mouth daily as needed for erectile dysfunction.    Historical Provider, MD  tamsulosin (FLOMAX) 0.4 MG CAPS capsule Take 0.4 mg by mouth.    Historical Provider, MD  traMADol (ULTRAM) 50 MG tablet 1-2 tabs by mouth Q8 hours, maximum 6 tabs per day. 06/29/14   Dewanna Hurston L Kindrick Lankford, PA-C   BP 133/82  Pulse 70  Temp(Src) 97.7 F (36.5 C) (Oral)  Resp 14  Wt 188 lb 3.2 oz (85.367 kg)  SpO2 99% Physical Exam  Constitutional: He is oriented to person, place, and time. He appears well-developed and well-nourished.  HENT:  Head: Normocephalic and atraumatic.  Cardiovascular: Normal rate, regular rhythm and normal heart sounds.   Pulmonary/Chest: Effort normal and breath sounds normal.  Musculoskeletal:  Warm and tender to touch MCP joint of right great toe.   Neurological: He is alert and oriented to person, place, and time.  Psychiatric: He has a normal mood and affect. His behavior is normal.    ED Course  Procedures (including critical care time) Labs Review Labs Reviewed  URIC ACID  COMPLETE METABOLIC PANEL WITH GFR    Imaging Review Dg Toe Great Right  06/29/2014   CLINICAL DATA:  Right great toe pain for 2 weeks.  EXAM: RIGHT GREAT TOE  COMPARISON:  None.  FINDINGS: Mild degenerative loss of articular space at the first metatarsophalangeal joint with soft tissue swelling laterally. There is potentially some mild periostitis medially at the base of the proximal phalanx. No bony destructive findings characteristic of osteomyelitis.  IMPRESSION: 1. Soft tissue swelling and potentially slight periostitis along the first metatarsophalangeal joint. Based on location, gout arthropathy is  likely although the appearance is not pathognomonic. 2. There are also some degenerative findings at the first metatarsophalangeal joint.   Electronically Signed   By: Sherryl Barters M.D.   On: 06/29/2014 13:07     MDM   1. Gouty arthritis of toe, right   2. Toe pain, right     Xray of toe did show some signs if gout arthropathy and DDD. With acute nature today suspect pain today is coming from gout.  Will treat with prednisone taper today.  Tramadol given for pain.  HO for gout given to pt today.  Discussed how diet can cause exacerbation.  Follow up with PCP to discuss preventative therapy for gout. Will redraw uric acid and cmp.  Could be that pt is not outside of normal range but in the high normal for him and that is when he gets flare up. PcP could consider treatment.  Follow up as needed. If not improving follow up UC or PCP.     Donella Stade, PA-C 06/29/14 1322

## 2014-06-29 NOTE — Discharge Instructions (Signed)

## 2014-06-29 NOTE — ED Notes (Signed)
Brandon [name] c/o right great toe pain and swelling starting last night. Problem has been intermittent x 1 year. Checked for gout/uric acid level several times, all normal. Reports problem intially started when taking "chemo pills" for his liver.

## 2014-06-30 ENCOUNTER — Telehealth: Payer: Self-pay | Admitting: *Deleted

## 2014-06-30 ENCOUNTER — Encounter: Payer: Self-pay | Admitting: Physician Assistant

## 2014-06-30 LAB — COMPLETE METABOLIC PANEL WITH GFR
ALBUMIN: 4.3 g/dL (ref 3.5–5.2)
ALT: 16 U/L (ref 0–53)
AST: 18 U/L (ref 0–37)
Alkaline Phosphatase: 103 U/L (ref 39–117)
BUN: 15 mg/dL (ref 6–23)
CALCIUM: 9.4 mg/dL (ref 8.4–10.5)
CHLORIDE: 101 meq/L (ref 96–112)
CO2: 25 mEq/L (ref 19–32)
Creat: 1.22 mg/dL (ref 0.50–1.35)
GFR, Est African American: 72 mL/min
GFR, Est Non African American: 62 mL/min
Glucose, Bld: 82 mg/dL (ref 70–99)
POTASSIUM: 3.8 meq/L (ref 3.5–5.3)
Sodium: 137 mEq/L (ref 135–145)
Total Bilirubin: 0.7 mg/dL (ref 0.2–1.2)
Total Protein: 7.4 g/dL (ref 6.0–8.3)

## 2014-06-30 LAB — URIC ACID: Uric Acid, Serum: 8 mg/dL — ABNORMAL HIGH (ref 4.0–7.8)

## 2014-06-30 NOTE — ED Provider Notes (Signed)
Agree with exam, assessment, and plan.   Jammi Morrissette A Laurens Matheny, MD 06/30/14 1336 

## 2014-07-14 ENCOUNTER — Encounter: Payer: Self-pay | Admitting: Family Medicine

## 2014-07-14 ENCOUNTER — Ambulatory Visit (INDEPENDENT_AMBULATORY_CARE_PROVIDER_SITE_OTHER): Payer: No Typology Code available for payment source | Admitting: Family Medicine

## 2014-07-14 VITALS — BP 153/81 | HR 81 | Temp 97.8°F | Wt 187.0 lb

## 2014-07-14 DIAGNOSIS — Z23 Encounter for immunization: Secondary | ICD-10-CM

## 2014-07-14 DIAGNOSIS — K625 Hemorrhage of anus and rectum: Secondary | ICD-10-CM

## 2014-07-14 NOTE — Patient Instructions (Addendum)
Benefiber- to help soften the stool Drink plenty to water.          Constipation Constipation is when a person has fewer than three bowel movements a week, has difficulty having a bowel movement, or has stools that are dry, hard, or larger than normal. As people grow older, constipation is more common. If you try to fix constipation with medicines that make you have a bowel movement (laxatives), the problem may get worse. Long-term laxative use may cause the muscles of the colon to become weak. A low-fiber diet, not taking in enough fluids, and taking certain medicines may make constipation worse.  CAUSES   Certain medicines, such as antidepressants, pain medicine, iron supplements, antacids, and water pills.   Certain diseases, such as diabetes, irritable bowel syndrome (IBS), thyroid disease, or depression.   Not drinking enough water.   Not eating enough fiber-rich foods.   Stress or travel.   Lack of physical activity or exercise.   Ignoring the urge to have a bowel movement.   Using laxatives too much.  SIGNS AND SYMPTOMS   Having fewer than three bowel movements a week.   Straining to have a bowel movement.   Having stools that are hard, dry, or larger than normal.   Feeling full or bloated.   Pain in the lower abdomen.   Not feeling relief after having a bowel movement.  DIAGNOSIS  Your health care provider will take a medical history and perform a physical exam. Further testing may be done for severe constipation. Some tests may include:  A barium enema X-ray to examine your rectum, colon, and, sometimes, your small intestine.   A sigmoidoscopy to examine your lower colon.   A colonoscopy to examine your entire colon. TREATMENT  Treatment will depend on the severity of your constipation and what is causing it. Some dietary treatments include drinking more fluids and eating more fiber-rich foods. Lifestyle treatments may include regular  exercise. If these diet and lifestyle recommendations do not help, your health care provider may recommend taking over-the-counter laxative medicines to help you have bowel movements. Prescription medicines may be prescribed if over-the-counter medicines do not work.  HOME CARE INSTRUCTIONS   Eat foods that have a lot of fiber, such as fruits, vegetables, whole grains, and beans.  Limit foods high in fat and processed sugars, such as french fries, hamburgers, cookies, candies, and soda.   A fiber supplement may be added to your diet if you cannot get enough fiber from foods.   Drink enough fluids to keep your urine clear or pale yellow.   Exercise regularly or as directed by your health care provider.   Go to the restroom when you have the urge to go. Do not hold it.   Only take over-the-counter or prescription medicines as directed by your health care provider. Do not take other medicines for constipation without talking to your health care provider first.  [place] IF:   You have bright red blood in your stool.   Your constipation lasts for more than 4 days or gets worse.   You have abdominal or rectal pain.   You have thin, pencil-like stools.   You have unexplained weight loss. MAKE SURE YOU:   Understand these instructions.  Will watch your condition.  Will get help right away if you are not doing well or get worse. Document Released: 07/06/2004 Document Revised: 10/13/2013 Document Reviewed: 07/20/2013 [facility] Patient Information 2015 [place], [place]. This information is  not intended to replace advice given to you by your health care provider. Make sure you discuss any questions you have with your health care provider.

## 2014-07-14 NOTE — Progress Notes (Signed)
Subjective:    Patient ID: Brandon [name], male    DOB: [birth year], 63 y.o.   MRN: [id number]  Rectal Bleeding    noticed yesterday and today he reports that he did strain some with BM he noticed that the blood was bright red does not think that he has hemorrhoids.  Has had some previous episdoes in the last few month as well.  Has had some abdominal pain, central.  Some nausea. Doesn't take frequent NSAIDs.  Occ uses for gout.  Still on his protonix. Blood is Bright red.  Hx of colon polyps. Last colonoscopy was in 2012. No vomiting or dysphagia.    Review of Systems  Gastrointestinal: Positive for hematochezia.   BP 153/81  Pulse 81  Temp(Src) 97.8 F (36.6 C)  Wt 187 lb (84.823 kg)    No Known Allergies  Past Medical History  Diagnosis Date  . Hepatitis C     [phone], treated at [facility]  . Nose fracture   . Hypertension   . GERD (gastroesophageal reflux disease)   . ED (erectile dysfunction)     Past Surgical History  Procedure Laterality Date  . Foot surgery Right     1980  . Foot surgery Left     1980  . Inguinal hernia repair Right     2009    History   Social History  . Marital Status: Married    Spouse Name: Artavious Trebilcock    Number of Children: 2  . Years of Education: N/A   Occupational History  . [employer]     Dept of [employer] in [place] Topics  . Smoking status: Former Smoker    Types: Cigarettes    Quit date: 10/22/1996  . Smokeless tobacco: Never Used  . Alcohol Use: No  . Drug Use: No  . Sexual Activity: Yes    Partners: Female   Other Topics Concern  . Not on file   Social History Narrative   No caffeine.  Occ exercise. Has a 2 years associate degree. Previously worked at [employer] for 12 years. Also work to [employer].    Family History  Problem Relation Age of Onset  . Breast cancer Sister   . Depression Sister   . Hyperlipidemia    . Ovarian cancer Sister   . Hypertension      Outpatient  Encounter Prescriptions as of 07/14/2014  Medication Sig  . acyclovir (ZOVIRAX) 800 MG tablet   . albuterol (PROVENTIL HFA;VENTOLIN HFA) 108 (90 BASE) MCG/ACT inhaler Inhale 2 puffs into the lungs every 6 (six) hours as needed for wheezing or shortness of breath.  . AMBULATORY NON FORMULARY MEDICATION Medication Name: Metabolix co Q 10  . AMBULATORY NON FORMULARY MEDICATION Medication Name: vit D with probiotic  . AMBULATORY NON FORMULARY MEDICATION Medication Name: daily defense  . amLODipine (NORVASC) 5 MG tablet Take 1 tablet (5 mg total) by mouth daily.  [name] aspirin 81 MG tablet Take 81 mg by mouth daily.  . clindamycin (CLEOCIN T) 1 % lotion Apply topically 2 (two) times daily.  . clotrimazole-betamethasone (LOTRISONE) cream   . cyanocobalamin 500 MCG tablet Take 500 mcg by mouth daily.  . diclofenac (CATAFLAM) 50 MG tablet   . doxycycline (MONODOX) 100 MG capsule   . flunisolide (NASALIDE) 25 MCG/ACT (0.025%) SOLN Place 2 sprays into the nose 2 (two) times daily.  . hydrocortisone (ANUSOL-HC) 25 MG suppository Place 25 mg rectally 2 (two) times daily.  [name]  hydrocortisone 2.5 % cream Apply topically 2 (two) times daily.  [name] losartan-hydrochlorothiazide (HYZAAR) 100-25 MG per tablet Take 1 tablet by mouth daily.  . meloxicam (MOBIC) 7.5 MG tablet Take 1 twice a day as needed for pain. Take with food. (Do not take with any other NSAID.)  . Naftifine HCl 1 % GEL Apply topically.  . Omega-3 Fatty Acids (FISH OIL) 1200 MG CAPS Take by mouth.  . pantoprazole (PROTONIX) 40 MG tablet Take 1 tablet (40 mg total) by mouth daily.  . tadalafil (CIALIS) 5 MG tablet Take 5 mg by mouth daily as needed for erectile dysfunction.  . tamsulosin (FLOMAX) 0.4 MG CAPS capsule Take 0.4 mg by mouth.  . terbinafine (LAMISIL) 250 MG tablet   . traMADol (ULTRAM) 50 MG tablet 1-2 tabs by mouth Q8 hours, maximum 6 tabs per day.  . [DISCONTINUED] HYDROcodone-homatropine (HYCODAN) 5-1.5 MG/5ML syrup Take 5 mLs by mouth at  bedtime as needed.  . [DISCONTINUED] predniSONE (DELTASONE) 20 MG tablet Take 3 tabs for 3 days, Take 2 tablets for 3 days, take 1 tablet for 3 days, take 1/2 tablet for 4 days.  . [DISCONTINUED] predniSONE (DELTASONE) 50 MG tablet Take one tablet for 5 days.           Objective:   Physical Exam  Constitutional: He is oriented to person, place, and time. He appears well-developed and well-nourished.  HENT:  Head: Normocephalic and atraumatic.  Abdominal: Soft. Bowel sounds are normal. He exhibits no distension and no mass. There is tenderness. There is no rebound and no guarding.  Mild tenderness with deep palpation just above the umbilicus.  Genitourinary: Guaiac positive stool.  Rectal exam is normal with normal sphincter tone. No external hemorrhoids. I did use an anoscope. Unable to visualize any internal hemorrhoids or fissures or tears or active bleeding. Guaiac was positive for Hemoccult blood.   Neurological: He is alert and oriented to person, place, and time.  Skin: Skin is warm and dry.  Psychiatric: He has a normal mood and affect. His behavior is normal.          Assessment & Plan:  Blood in stool - unfortunately I was unable to find the source of bleeding on exam today. It does sound like it's coming from the lower tree I tracked with more bright red blood. He has had a little bit of discomfort just above the umbilicus as well as some intermittent nausea. He is Re: on PPI. I recommend referral to gastroneurology for further evaluation. His last colonoscopy was about 3 years ago and was fairly normal per his report. He would like to see if he can go back to see Dr. Darlis Loan at [facility] in [place]. If they do not take his insurance and we can try [facility] or [facility] specialists per his request. In the meantime discussed the importance of getting the stools were soft as he does report some hard stools. We discussed  increasing fiber through diet and also possibly adding a fiber supplement such as Metamucil or Benefiber. He can also add a stool softener with the Zyrtec to take on a daily basis long-term. Handout provided with some additional information. Make sure drinking plan of water during the day.

## 2014-07-21 LAB — HM COLONOSCOPY

## 2014-07-29 ENCOUNTER — Telehealth: Payer: Self-pay | Admitting: *Deleted

## 2014-07-29 NOTE — Telephone Encounter (Signed)
Pt advised.Brandon [name] Brandon [name]  

## 2014-07-29 NOTE — Telephone Encounter (Signed)
Pt called and stated that his ears, and throat have been bothering him for the past week. He has been using the cough syrup and taking IBU. He denies fever,or chills. He has been coughing up green mucous. I told him that he could try either allegra, claritin, or zyrtec.  Please advise.Maryruth Eve, L[place] Crocker

## 2014-07-29 NOTE — Telephone Encounter (Signed)
If symptoms have been over a week then he could need antibiotic. Could he schedule for tomorrow.

## 2014-09-07 ENCOUNTER — Ambulatory Visit (INDEPENDENT_AMBULATORY_CARE_PROVIDER_SITE_OTHER): Payer: No Typology Code available for payment source

## 2014-09-07 ENCOUNTER — Ambulatory Visit (INDEPENDENT_AMBULATORY_CARE_PROVIDER_SITE_OTHER): Payer: No Typology Code available for payment source | Admitting: Physician Assistant

## 2014-09-07 ENCOUNTER — Encounter: Payer: Self-pay | Admitting: Physician Assistant

## 2014-09-07 VITALS — BP 115/58 | HR 76 | Ht 67.6 in | Wt 190.0 lb

## 2014-09-07 DIAGNOSIS — R1084 Generalized abdominal pain: Secondary | ICD-10-CM

## 2014-09-07 NOTE — Progress Notes (Signed)
   Subjective:    Patient ID: Brandon [name], male    DOB: 09/21/1951, 63 y.o.   MRN: [id number]  HPI  Pt is a 63 yo male who presents to the clinic with generalized abdomen pain that seems to be more focused over LUQ and LLQ. He has hx of constipation, hemorrhoids, and GERD. He did take laxative last time and had 2-3 soft bowel movements today. Negative for any blood in stool. He does have some nausea but no vomiting. No fever or chills. Just had colonsocopy last month and polyps removed but essentially normal. He does feel gassy and bloated. Admits acid reflux feels worse than normal.       Review of Systems  All other systems reviewed and are negative.      Objective:   Physical Exam  Constitutional: He is oriented to person, place, and time. He appears well-developed and well-nourished.  HENT:  Head: Normocephalic and atraumatic.  Cardiovascular: Normal rate, regular rhythm and normal heart sounds.   Pulmonary/Chest: Effort normal and breath sounds normal.  Abdominal:  Slightly distended. Bowel sounds normal. Mild tenderness to palpation over LUQ and LLQ. No guarding or rebound.   Neurological: He is alert and oriented to person, place, and time.  Psychiatric: He has a normal mood and affect. His behavior is normal.          Assessment & Plan:  Generalized abdominal pain- will get cbc, cmp, lipase. Will also get abdominal xray. Sounds like could be constipation or worsening acid reflux. Discussed GERD diet and to increase to protonix to twice a day for next 5 days. Call with worsening symptoms or if not improving.

## 2014-09-07 NOTE — Patient Instructions (Signed)
Increase protonix 40mg  twice a day at least for next week.   Food Choices for Gastroesophageal Reflux Disease When you have gastroesophageal reflux disease (GERD), the foods you eat and your eating habits are very important. Choosing the right foods can help ease the discomfort of GERD. WHAT GENERAL GUIDELINES DO I NEED TO FOLLOW?  Choose fruits, vegetables, whole grains, low-fat dairy products, and low-fat meat, fish, and poultry.  Limit fats such as oils, salad dressings, butter, nuts, and avocado.  Keep a food diary to identify foods that cause symptoms.  Avoid foods that cause reflux. These may be different for different people.  Eat frequent small meals instead of three large meals each day.  Eat your meals slowly, in a relaxed setting.  Limit fried foods.  Cook foods using methods other than frying.  Avoid drinking alcohol.  Avoid drinking large amounts of liquids with your meals.  Avoid bending over or lying down until 2-3 hours after eating. WHAT FOODS ARE NOT RECOMMENDED? The following are some foods and drinks that may worsen your symptoms: Vegetables Tomatoes. Tomato juice. Tomato and spaghetti sauce. Chili peppers. Onion and garlic. Horseradish. Fruits Oranges, grapefruit, and lemon (fruit and juice). Meats High-fat meats, fish, and poultry. This includes hot dogs, ribs, ham, sausage, salami, and bacon. Dairy Whole milk and chocolate milk. Sour cream. Cream. Butter. Ice cream. Cream cheese.  Beverages Coffee and tea, with or without caffeine. Carbonated beverages or energy drinks. Condiments Hot sauce. Barbecue sauce.  Sweets/Desserts Chocolate and cocoa. Donuts. Peppermint and spearmint. Fats and Oils High-fat foods, including [place] fries and potato chips. Other Vinegar. Strong spices, such as black pepper, white pepper, red pepper, cayenne, curry powder, cloves, ginger, and chili powder. The items listed above may not be a complete list of foods and  beverages to avoid. Contact your dietitian for more information. Document Released: 10/08/2005 Document Revised: 10/13/2013 Document Reviewed: 08/12/2013 [facility] Patient Information 2015 [place], [place]. This information is not intended to replace advice given to you by your health care provider. Make sure you discuss any questions you have with your health care provider.   Low Back Sprain with Rehab  A sprain is an injury in which a ligament is torn. The ligaments of the lower back are vulnerable to sprains. However, they are strong and require great force to be injured. These ligaments are important for stabilizing the spinal column. Sprains are classified into three categories. Grade 1 sprains cause pain, but the tendon is not lengthened. Grade 2 sprains include a lengthened ligament, due to the ligament being stretched or partially ruptured. With grade 2 sprains there is still function, although the function may be decreased. Grade 3 sprains involve a complete tear of the tendon or muscle, and function is usually impaired.   SYMPTOMS   Severe pain in the lower back.  Sometimes, a feeling of a "pop," "snap," or tear, at the time of injury.  Tenderness and sometimes swelling at the injury site.  Uncommonly, bruising (contusion) within 48 hours of injury.  Muscle spasms in the back. CAUSES  Low back sprains occur when a force is placed on the ligaments that is greater than they can handle. Common causes of injury include:  Performing a stressful act while off-balance.  Repetitive stressful activities that involve movement of the lower back.  Direct hit (trauma) to the lower back. RISK INCREASES WITH:  Contact sports (football, wrestling).  Collisions (major skiing accidents).  Sports that require throwing or lifting (baseball, weightlifting).  Sports  involving twisting of the spine (gymnastics, diving, tennis, golf).  Poor strength and flexibility.  Inadequate  protection.  Previous back injury or surgery (especially fusion). PREVENTION  Wear properly fitted and padded protective equipment.  Warm up and stretch properly before activity.  Allow for adequate recovery between workouts.  Maintain physical fitness:  Strength, flexibility, and endurance.  Cardiovascular fitness.  Maintain a healthy body weight. PROGNOSIS  If treated properly, low back sprains usually heal with non-surgical treatment. The length of time for healing depends on the severity of the injury.  RELATED COMPLICATIONS   Recurring symptoms, resulting in a chronic problem.  Chronic inflammation and pain in the low back.  Delayed healing or resolution of symptoms, especially if activity is resumed too soon.  Prolonged impairment.  Unstable or arthritic joints of the low back. TREATMENT  Treatment first involves the use of ice and medicine, to reduce pain and inflammation. The use of strengthening and stretching exercises may help reduce pain with activity. These exercises may be performed at home or with a therapist. Severe injuries may require referral to a therapist for further evaluation and treatment, such as ultrasound. Your caregiver may advise that you wear a back brace or corset, to help reduce pain and discomfort. Often, prolonged bed rest results in greater harm then benefit. Corticosteroid injections may be recommended. However, these should be reserved for the most serious cases. It is important to avoid using your back when lifting objects. At night, sleep on your back on a firm mattress, with a pillow placed under your knees. If non-surgical treatment is unsuccessful, surgery may be needed.  MEDICATION   If pain medicine is needed, nonsteroidal anti-inflammatory medicines (aspirin and ibuprofen), or other minor pain relievers (acetaminophen), are often advised.  Do not take pain medicine for 7 days before surgery.  Prescription pain relievers may be given,  if your caregiver thinks they are needed. Use only as directed and only as much as you need.  Ointments applied to the skin may be helpful.  Corticosteroid injections may be given by your caregiver. These injections should be reserved for the most serious cases, because they may only be given a certain number of times. HEAT AND COLD  Cold treatment (icing) should be applied for 10 to 15 minutes every 2 to 3 hours for inflammation and pain, and immediately after activity that aggravates your symptoms. Use ice packs or an ice massage.  Heat treatment may be used before performing stretching and strengthening activities prescribed by your caregiver, physical therapist, or athletic trainer. Use a heat pack or a warm water soak. SEEK MEDICAL CARE IF:   Symptoms get worse or do not improve in 2 to 4 weeks, despite treatment.  You develop numbness or weakness in either leg.  You lose bowel or bladder function.  Any of the following occur after surgery: fever, increased pain, swelling, redness, drainage of fluids, or bleeding in the affected area.  New, unexplained symptoms develop. (Drugs used in treatment may produce side effects.) EXERCISES  RANGE OF MOTION (ROM) AND STRETCHING EXERCISES - Low Back Sprain Most people with lower back pain will find that their symptoms get worse with excessive bending forward (flexion) or arching at the lower back (extension). The exercises that will help resolve your symptoms will focus on the opposite motion.  Your physician, physical therapist or athletic trainer will help you determine which exercises will be most helpful to resolve your lower back pain. Do not complete any exercises without first  consulting with your caregiver. Discontinue any exercises which make your symptoms worse, until you speak to your caregiver. If you have pain, numbness or tingling which travels down into your buttocks, leg or foot, the goal of the therapy is for these symptoms to  move closer to your back and eventually resolve. Sometimes, these leg symptoms will get better, but your lower back pain may worsen. This is often an indication of progress in your rehabilitation. Be very alert to any changes in your symptoms and the activities in which you participated in the 24 hours prior to the change. Sharing this information with your caregiver will allow him or her to most efficiently treat your condition. These exercises may help you when beginning to rehabilitate your injury. Your symptoms may resolve with or without further involvement from your physician, physical therapist or athletic trainer. While completing these exercises, remember:   Restoring tissue flexibility helps normal motion to return to the joints. This allows healthier, less painful movement and activity.  An effective stretch should be held for at least 30 seconds.  A stretch should never be painful. You should only feel a gentle lengthening or release in the stretched tissue. FLEXION RANGE OF MOTION AND STRETCHING EXERCISES: STRETCH - Flexion, Single Knee to Chest   Lie on a firm bed or floor with both legs extended in front of you.  Keeping one leg in contact with the floor, bring your opposite knee to your chest. Hold your leg in place by either grabbing behind your thigh or at your knee.  Pull until you feel a gentle stretch in your low back. Hold __________ seconds.  Slowly release your grasp and repeat the exercise with the opposite side. Repeat __________ times. Complete this exercise __________ times per day.  STRETCH - Flexion, Double Knee to Chest  Lie on a firm bed or floor with both legs extended in front of you.  Keeping one leg in contact with the floor, bring your opposite knee to your chest.  Tense your stomach muscles to support your back and then lift your other knee to your chest. Hold your legs in place by either grabbing behind your thighs or at your knees.  Pull both knees  toward your chest until you feel a gentle stretch in your low back. Hold __________ seconds.  Tense your stomach muscles and slowly return one leg at a time to the floor. Repeat __________ times. Complete this exercise __________ times per day.  STRETCH - Low Trunk Rotation  Lie on a firm bed or floor. Keeping your legs in front of you, bend your knees so they are both pointed toward the ceiling and your feet are flat on the floor.  Extend your arms out to the side. This will stabilize your upper body by keeping your shoulders in contact with the floor.  Gently and slowly drop both knees together to one side until you feel a gentle stretch in your low back. Hold for __________ seconds.  Tense your stomach muscles to support your lower back as you bring your knees back to the starting position. Repeat the exercise to the other side. Repeat __________ times. Complete this exercise __________ times per day  EXTENSION RANGE OF MOTION AND FLEXIBILITY EXERCISES: STRETCH - Extension, Prone on Elbows   Lie on your stomach on the floor, a bed will be too soft. Place your palms about shoulder width apart and at the height of your head.  Place your elbows under your shoulders. If  this is too painful, stack pillows under your chest.  Allow your body to relax so that your hips drop lower and make contact more completely with the floor.  Hold this position for __________ seconds.  Slowly return to lying flat on the floor. Repeat __________ times. Complete this exercise __________ times per day.  RANGE OF MOTION - Extension, Prone Press Ups  Lie on your stomach on the floor, a bed will be too soft. Place your palms about shoulder width apart and at the height of your head.  Keeping your back as relaxed as possible, slowly straighten your elbows while keeping your hips on the floor. You may adjust the placement of your hands to maximize your comfort. As you gain motion, your hands will come more  underneath your shoulders.  Hold this position __________ seconds.  Slowly return to lying flat on the floor. Repeat __________ times. Complete this exercise __________ times per day.  RANGE OF MOTION- Quadruped, Neutral Spine   Assume a hands and knees position on a firm surface. Keep your hands under your shoulders and your knees under your hips. You may place padding under your knees for comfort.  Drop your head and point your tailbone toward the ground below you. This will round out your lower back like an angry cat. Hold this position for __________ seconds.  Slowly lift your head and release your tail bone so that your back sags into a large arch, like an old horse.  Hold this position for __________ seconds.  Repeat this until you feel limber in your low back.  Now, find your "sweet spot." This will be the most comfortable position somewhere between the two previous positions. This is your neutral spine. Once you have found this position, tense your stomach muscles to support your low back.  Hold this position for __________ seconds. Repeat __________ times. Complete this exercise __________ times per day.  STRENGTHENING EXERCISES - Low Back Sprain These exercises may help you when beginning to rehabilitate your injury. These exercises should be done near your "sweet spot." This is the neutral, low-back arch, somewhere between fully rounded and fully arched, that is your least painful position. When performed in this safe range of motion, these exercises can be used for people who have either a flexion or extension based injury. These exercises may resolve your symptoms with or without further involvement from your physician, physical therapist or athletic trainer. While completing these exercises, remember:   Muscles can gain both the endurance and the strength needed for everyday activities through controlled exercises.  Complete these exercises as instructed by your physician,  physical therapist or athletic trainer. Increase the resistance and repetitions only as guided.  You may experience muscle soreness or fatigue, but the pain or discomfort you are trying to eliminate should never worsen during these exercises. If this pain does worsen, stop and make certain you are following the directions exactly. If the pain is still present after adjustments, discontinue the exercise until you can discuss the trouble with your caregiver. STRENGTHENING - Deep Abdominals, Pelvic Tilt   Lie on a firm bed or floor. Keeping your legs in front of you, bend your knees so they are both pointed toward the ceiling and your feet are flat on the floor.  Tense your lower abdominal muscles to press your low back into the floor. This motion will rotate your pelvis so that your tail bone is scooping upwards rather than pointing at your feet or into the  floor. With a gentle tension and even breathing, hold this position for __________ seconds. Repeat __________ times. Complete this exercise __________ times per day.  STRENGTHENING - Abdominals, Crunches   Lie on a firm bed or floor. Keeping your legs in front of you, bend your knees so they are both pointed toward the ceiling and your feet are flat on the floor. Cross your arms over your chest.  Slightly tip your chin down without bending your neck.  Tense your abdominals and slowly lift your trunk high enough to just clear your shoulder blades. Lifting higher can put excessive stress on the lower back and does not further strengthen your abdominal muscles.  Control your return to the starting position. Repeat __________ times. Complete this exercise __________ times per day.  STRENGTHENING - Quadruped, Opposite UE/LE Lift   Assume a hands and knees position on a firm surface. Keep your hands under your shoulders and your knees under your hips. You may place padding under your knees for comfort.  Find your neutral spine and gently tense  your abdominal muscles so that you can maintain this position. Your shoulders and hips should form a rectangle that is parallel with the floor and is not twisted.  Keeping your trunk steady, lift your right hand no higher than your shoulder and then your left leg no higher than your hip. Make sure you are not holding your breath. Hold this position for __________ seconds.  Continuing to keep your abdominal muscles tense and your back steady, slowly return to your starting position. Repeat with the opposite arm and leg. Repeat __________ times. Complete this exercise __________ times per day.  STRENGTHENING - Abdominals and Quadriceps, Straight Leg Raise   Lie on a firm bed or floor with both legs extended in front of you.  Keeping one leg in contact with the floor, bend the other knee so that your foot can rest flat on the floor.  Find your neutral spine, and tense your abdominal muscles to maintain your spinal position throughout the exercise.  Slowly lift your straight leg off the floor about 6 inches for a count of 15, making sure to not hold your breath.  Still keeping your neutral spine, slowly lower your leg all the way to the floor. Repeat this exercise with each leg __________ times. Complete this exercise __________ times per day. POSTURE AND BODY MECHANICS CONSIDERATIONS - Low Back Sprain Keeping correct posture when sitting, standing or completing your activities will reduce the stress put on different body tissues, allowing injured tissues a chance to heal and limiting painful experiences. The following are general guidelines for improved posture. Your physician or physical therapist will provide you with any instructions specific to your needs. While reading these guidelines, remember:  The exercises prescribed by your provider will help you have the flexibility and strength to maintain correct postures.  The correct posture provides the best environment for your joints to work.  All of your joints have less wear and tear when properly supported by a spine with good posture. This means you will experience a healthier, less painful body.  Correct posture must be practiced with all of your activities, especially prolonged sitting and standing. Correct posture is as important when doing repetitive low-stress activities (typing) as it is when doing a single heavy-load activity (lifting). RESTING POSITIONS Consider which positions are most painful for you when choosing a resting position. If you have pain with flexion-based activities (sitting, bending, stooping, squatting), choose a position that  allows you to rest in a less flexed posture. You would want to avoid curling into a fetal position on your side. If your pain worsens with extension-based activities (prolonged standing, working overhead), avoid resting in an extended position such as sleeping on your stomach. Most people will find more comfort when they rest with their spine in a more neutral position, neither too rounded nor too arched. Lying on a non-sagging bed on your side with a pillow between your knees, or on your back with a pillow under your knees will often provide some relief. Keep in mind, being in any one position for a prolonged period of time, no matter how correct your posture, can still lead to stiffness. PROPER SITTING POSTURE In order to minimize stress and discomfort on your spine, you must sit with correct posture. Sitting with good posture should be effortless for a healthy body. Returning to good posture is a gradual process. Many people can work toward this most comfortably by using various supports until they have the flexibility and strength to maintain this posture on their own. When sitting with proper posture, your ears will fall over your shoulders and your shoulders will fall over your hips. You should use the back of the chair to support your upper back. Your lower back will be in a neutral  position, just slightly arched. You may place a small pillow or folded towel at the base of your lower back for  support.  When working at a desk, create an environment that supports good, upright posture. Without extra support, muscles tire, which leads to excessive strain on joints and other tissues. Keep these recommendations in mind: CHAIR:  A chair should be able to slide under your desk when your back makes contact with the back of the chair. This allows you to work closely.  The chair's height should allow your eyes to be level with the upper part of your monitor and your hands to be slightly lower than your elbows. BODY POSITION  Your feet should make contact with the floor. If this is not possible, use a foot rest.  Keep your ears over your shoulders. This will reduce stress on your neck and low back. INCORRECT SITTING POSTURES  If you are feeling tired and unable to assume a healthy sitting posture, do not slouch or slump. This puts excessive strain on your back tissues, causing more damage and pain. Healthier options include:  Using more support, like a lumbar pillow.  Switching tasks to something that requires you to be upright or walking.  Talking a brief walk.  Lying down to rest in a neutral-spine position. PROLONGED STANDING WHILE SLIGHTLY LEANING FORWARD  When completing a task that requires you to lean forward while standing in one place for a long time, place either foot up on a stationary 2-4 inch high object to help maintain the best posture. When both feet are on the ground, the lower back tends to lose its slight inward curve. If this curve flattens (or becomes too large), then the back and your other joints will experience too much stress, tire more quickly, and can cause pain. CORRECT STANDING POSTURES Proper standing posture should be assumed with all daily activities, even if they only take a few moments, like when brushing your teeth. As in sitting, your ears  should fall over your shoulders and your shoulders should fall over your hips. You should keep a slight tension in your abdominal muscles to brace your spine. Your tailbone  should point down to the ground, not behind your body, resulting in an over-extended swayback posture.  INCORRECT STANDING POSTURES  Common incorrect standing postures include a forward head, locked knees and/or an excessive swayback. WALKING Walk with an upright posture. Your ears, shoulders and hips should all line-up. PROLONGED ACTIVITY IN A FLEXED POSITION When completing a task that requires you to bend forward at your waist or lean over a low surface, try to find a way to stabilize 3 out of 4 of your limbs. You can place a hand or elbow on your thigh or rest a knee on the surface you are reaching across. This will provide you more stability, so that your muscles do not tire as quickly. By keeping your knees relaxed, or slightly bent, you will also reduce stress across your lower back. CORRECT LIFTING TECHNIQUES DO :  Assume a wide stance. This will provide you more stability and the opportunity to get as close as possible to the object which you are lifting.  Tense your abdominals to brace your spine. Bend at the knees and hips. Keeping your back locked in a neutral-spine position, lift using your leg muscles. Lift with your legs, keeping your back straight.  Test the weight of unknown objects before attempting to lift them.  Try to keep your elbows locked down at your sides in order get the best strength from your shoulders when carrying an object.  Always ask for help when lifting heavy or awkward objects. INCORRECT LIFTING TECHNIQUES DO NOT:   Lock your knees when lifting, even if it is a small object.  Bend and twist. Pivot at your feet or move your feet when needing to change directions.  Assume that you can safely pick up even a paperclip without proper posture. Document Released: 10/08/2005 Document  Revised: 12/31/2011 Document Reviewed: 01/20/2009 [facility] Patient Information 2015 [place], [place]. This information is not intended to replace advice given to you by your health care provider. Make sure you discuss any questions you have with your health care provider.

## 2014-09-08 LAB — COMPLETE METABOLIC PANEL WITH GFR
ALT: 16 U/L (ref 0–53)
AST: 14 U/L (ref 0–37)
Albumin: 4 g/dL (ref 3.5–5.2)
Alkaline Phosphatase: 93 U/L (ref 39–117)
BILIRUBIN TOTAL: 0.4 mg/dL (ref 0.2–1.2)
BUN: 17 mg/dL (ref 6–23)
CO2: 27 mEq/L (ref 19–32)
CREATININE: 1.3 mg/dL (ref 0.50–1.35)
Calcium: 9.1 mg/dL (ref 8.4–10.5)
Chloride: 99 mEq/L (ref 96–112)
GFR, EST NON AFRICAN AMERICAN: 58 mL/min — AB
GFR, Est African American: 67 mL/min
GLUCOSE: 104 mg/dL — AB (ref 70–99)
Potassium: 4.1 mEq/L (ref 3.5–5.3)
SODIUM: 138 meq/L (ref 135–145)
TOTAL PROTEIN: 6.7 g/dL (ref 6.0–8.3)

## 2014-09-08 LAB — CBC WITH DIFFERENTIAL/PLATELET
BASOS PCT: 1 % (ref 0–1)
Basophils Absolute: 0.1 10*3/uL (ref 0.0–0.1)
EOS ABS: 0.2 10*3/uL (ref 0.0–0.7)
EOS PCT: 2 % (ref 0–5)
HCT: 44.4 % (ref 39.0–52.0)
Hemoglobin: 14.8 g/dL (ref 13.0–17.0)
Lymphocytes Relative: 28 % (ref 12–46)
Lymphs Abs: 2.3 10*3/uL (ref 0.7–4.0)
MCH: 24.9 pg — ABNORMAL LOW (ref 26.0–34.0)
MCHC: 33.3 g/dL (ref 30.0–36.0)
MCV: 74.7 fL — ABNORMAL LOW (ref 78.0–100.0)
MONOS PCT: 9 % (ref 3–12)
Monocytes Absolute: 0.7 10*3/uL (ref 0.1–1.0)
NEUTROS PCT: 60 % (ref 43–77)
Neutro Abs: 5 10*3/uL (ref 1.7–7.7)
Platelets: 177 10*3/uL (ref 150–400)
RBC: 5.94 MIL/uL — ABNORMAL HIGH (ref 4.22–5.81)
RDW: 17.3 % — AB (ref 11.5–15.5)
WBC: 8.3 10*3/uL (ref 4.0–10.5)

## 2014-09-08 LAB — LIPASE: LIPASE: 51 U/L (ref 0–75)

## 2014-09-22 ENCOUNTER — Encounter: Payer: Self-pay | Admitting: Family Medicine

## 2014-09-22 ENCOUNTER — Ambulatory Visit (INDEPENDENT_AMBULATORY_CARE_PROVIDER_SITE_OTHER): Payer: No Typology Code available for payment source | Admitting: Family Medicine

## 2014-09-22 VITALS — BP 119/65 | HR 89 | Ht 67.0 in | Wt 194.0 lb

## 2014-09-22 DIAGNOSIS — M5416 Radiculopathy, lumbar region: Secondary | ICD-10-CM

## 2014-09-22 MED ORDER — HYDROCODONE-ACETAMINOPHEN 10-325 MG PO TABS: 1.0000 | ORAL_TABLET | Freq: Three times a day (TID) | ORAL | Status: DC | PRN

## 2014-09-22 MED ORDER — PREDNISONE 20 MG PO TABS: ORAL_TABLET | ORAL | Status: AC

## 2014-09-22 NOTE — Progress Notes (Signed)
CC: Halton Neas is a 63 y.o. male is here for Back Pain and Leg Pain   Subjective: HPI:  3-4 weeks of right low back pain that radiates down the back of the right leg all the way beyond the heel into the foot. It is worse after walking for long periods of time, sitting for long periods of time, and improves only with lying down or standing still for a few minutes. It never goes away completely and fluctuates between moderate and severe in severity based on actions above. He denies any recent or remote trauma and has never had this before. No benefit from tramadol, ketoprofen, or meloxicam. No other interventions as of yet. He denies any weakness numbness or any other motor or sensory disturbances in the right or left lower extremity other than that described above. Denies any focal joint pain in the right leg. Denies any swelling redness or warmth in the right leg. No midline back pain or saddle paresthesia.   No history of back surgery or injuries. Denies saddle paresthesia nor bowel or bladder incontinence   Review Of Systems Outlined In HPI  Past Medical History  Diagnosis Date  . Hepatitis C     [phone], treated at [facility]  . Nose fracture   . Hypertension   . GERD (gastroesophageal reflux disease)   . ED (erectile dysfunction)     Past Surgical History  Procedure Laterality Date  . Foot surgery Right     1980  . Foot surgery Left     1980  . Inguinal hernia repair Right     2009   Family History  Problem Relation Age of Onset  . Breast cancer Sister   . Depression Sister   . Hyperlipidemia    . Ovarian cancer Sister   . Hypertension      History   Social History  . Marital Status: Married    Spouse Name: Jarome Trull    Number of Children: 2  . Years of Education: N/A   Occupational History  . [employer]     Dept of [employer] in [place] Topics  . Smoking status: Former Smoker    Types: Cigarettes    Quit date:  10/22/1996  . Smokeless tobacco: Never Used  . Alcohol Use: No  . Drug Use: No  . Sexual Activity:    Partners: Female   Other Topics Concern  . Not on file   Social History Narrative   No caffeine.  Occ exercise. Has a 2 years associate degree. Previously worked at [employer] for 12 years. Also work to [employer].     Objective: BP 119/65 mmHg  Pulse 89  Ht 5\' 7"  (1.702 m)  Wt 194 lb (87.998 kg)  BMI 30.38 kg/m2  General: Alert and Oriented, No Acute Distress HEENT: Pupils equal, round, reactive to light. Conjunctivae clear.   Lungs: clear andcomfortable work of breathing Cardiac: Regular rate and rhythm. Back: No midline spinous process tenderness in the lumbar region nor paraspinal musculature tenderness. L4 and S1 DTR 2 over 4 bilaterally and symmetric. Full range of motion and strength of both lower extremity. No pain with palpation of the right greater trochanter. No pain with log roll. No pain with femoral grind while internally or externally rotating the femur. Straight leg raise negative. Faber negative, Fadir negative. Extremities: No peripheral edema.  Strong peripheral pulses.  Mental Status: No depression, anxiety, nor agitation. Skin: Warm and dry.  Assessment &  Plan: Brandon [name] was seen today for back pain and leg pain.  Diagnoses and associated orders for this visit:  Lumbar radiculopathy - predniSONE (DELTASONE) 20 MG tablet; Three tabs daily days 1-3, two tabs daily days 4-6, one tab daily days 7-9, half tab daily days 10-13. - HYDROcodone-acetaminophen (NORCO) 10-325 MG per tablet; Take 1 tablet by mouth every 8 (eight) hours as needed.    Lumbar radiculopathy: Start prednisone taper and as needed hydrocodone. I discussed that prednisone is here to potentially cure his pain and hydrocodone is only going to help mask the pain. If no better after the weekend I've recommended that he follow-up with his PCP or Dr. Darene Lamer in sports medicine for reevaluation and second opinion. Low  suspicion of claudication given excellent posterior tibialis pulse.  Return if symptoms worsen or fail to improve.

## 2014-10-06 ENCOUNTER — Emergency Department (INDEPENDENT_AMBULATORY_CARE_PROVIDER_SITE_OTHER)
Admission: EM | Admit: 2014-10-06 | Discharge: 2014-10-06 | Disposition: A | Discharge status: Home / Self Care | Payer: No Typology Code available for payment source | Source: Home / Self Care | Attending: Family Medicine | Admitting: Family Medicine

## 2014-10-06 ENCOUNTER — Emergency Department (INDEPENDENT_AMBULATORY_CARE_PROVIDER_SITE_OTHER): Payer: No Typology Code available for payment source

## 2014-10-06 ENCOUNTER — Encounter: Payer: Self-pay | Admitting: *Deleted

## 2014-10-06 DIAGNOSIS — R52 Pain, unspecified: Secondary | ICD-10-CM

## 2014-10-06 DIAGNOSIS — S29011A Strain of muscle and tendon of front wall of thorax, initial encounter: Secondary | ICD-10-CM

## 2014-10-06 DIAGNOSIS — Z87891 Personal history of nicotine dependence: Secondary | ICD-10-CM

## 2014-10-06 DIAGNOSIS — S2341XA Sprain of ribs, initial encounter: Secondary | ICD-10-CM

## 2014-10-06 DIAGNOSIS — I10 Essential (primary) hypertension: Secondary | ICD-10-CM

## 2014-10-06 DIAGNOSIS — R0781 Pleurodynia: Secondary | ICD-10-CM

## 2014-10-06 HISTORY — DX: Hyperlipidemia, unspecified: E78.5

## 2014-10-06 MED ORDER — CYCLOBENZAPRINE HCL 10 MG PO TABS: 10.0000 mg | ORAL_TABLET | Freq: Every day | ORAL | Status: DC

## 2014-10-06 NOTE — ED Notes (Signed)
Pt c/o RT rib area pain x 1100 today. Denies injury, cough or nausea.

## 2014-10-06 NOTE — ED Provider Notes (Signed)
CSN: [id number]     Arrival date & time 10/06/14  1254 History   First MD Initiated Contact with Patient 10/06/14 1330     Chief Complaint  Patient presents with  . Chest Pain      HPI Comments: At 11:30am today while leaning down with his hands on a counter, patient felt sudden pain in his right chest that has persisted.  The pain is worse with movement and deep inspiration.  No shortness of breath.  No recent cough.  No fevers, chills, and sweats.  No nausea/vomiting   Patient is a 63 y.o. male presenting with chest pain. The history is provided by the patient.  Chest Pain Pain location:  R lateral chest Pain quality: radiating and sharp   Pain quality: not crushing   Radiates to: right lateral back. Pain severity:  Mild Onset quality:  Sudden Duration:  2 hours Timing:  Constant Progression:  Unchanged Chronicity:  New Context: breathing, lifting and movement   Relieved by:  Nothing Worsened by:  Coughing, deep breathing and movement Ineffective treatments:  None tried Associated symptoms: no abdominal pain, no back pain, no cough, no diaphoresis, no fatigue, no nausea, no palpitations, no shortness of breath and not vomiting     Past Medical History  Diagnosis Date  . Hepatitis C     [phone], treated at [facility]  . Nose fracture   . Hypertension   . GERD (gastroesophageal reflux disease)   . ED (erectile dysfunction)   . Hyperlipidemia    Past Surgical History  Procedure Laterality Date  . Foot surgery Right     1980  . Foot surgery Left     1980  . Inguinal hernia repair Right     2009   Family History  Problem Relation Age of Onset  . Breast cancer Sister   . Depression Sister   . Hyperlipidemia    . Ovarian cancer Sister   . Hypertension     History  Substance Use Topics  . Smoking status: Former Smoker    Types: Cigarettes    Quit date: 10/22/1996  . Smokeless tobacco: Never Used  . Alcohol Use: No    Review of Systems  Constitutional:  Negative for diaphoresis and fatigue.  Respiratory: Negative for cough and shortness of breath.   Cardiovascular: Positive for chest pain. Negative for palpitations.  Gastrointestinal: Negative for nausea, vomiting and abdominal pain.  Musculoskeletal: Negative for back pain.  All other systems reviewed and are negative.   Allergies  Review of patient's allergies indicates no known allergies.  Home Medications   Prior to Admission medications   Medication Sig Start Date End Date Taking? Authorizing Provider  acyclovir (ZOVIRAX) 800 MG tablet  01/14/14   Historical Provider, MD  albuterol (PROVENTIL HFA;VENTOLIN HFA) 108 (90 BASE) MCG/ACT inhaler Inhale 2 puffs into the lungs every 6 (six) hours as needed for wheezing or shortness of breath. 06/09/14   Jade L Breeback, PA-C  amLODipine (NORVASC) 5 MG tablet Take 1 tablet (5 mg total) by mouth daily. 04/14/14   Hali Marry, MD  aspirin 81 MG tablet Take 81 mg by mouth daily.    Historical Provider, MD  clindamycin (CLEOCIN T) 1 % lotion Apply topically 2 (two) times daily.    Historical Provider, MD  clotrimazole-betamethasone (LOTRISONE) cream  09/22/13   Historical Provider, MD  cyclobenzaprine (FLEXERIL) 10 MG tablet Take 1 tablet (10 mg total) by mouth at bedtime. 10/06/14   Kandra Nicolas, MD  diclofenac (CATAFLAM) 50 MG tablet  04/06/14   Historical Provider, MD  flunisolide (NASALIDE) 25 MCG/ACT (0.025%) SOLN Place 2 sprays into the nose as needed.     Historical Provider, MD  HYDROcodone-acetaminophen (NORCO) 10-325 MG per tablet Take 1 tablet by mouth every 8 (eight) hours as needed. 09/22/14   Marcial Pacas, DO  hydrocortisone (ANUSOL-HC) 25 MG suppository Place 25 mg rectally as needed.     Historical Provider, MD  hydrocortisone 2.5 % cream Apply topically as needed.     Historical Provider, MD  losartan-hydrochlorothiazide (HYZAAR) 100-25 MG per tablet Take 1 tablet by mouth daily. 04/14/14   Hali Marry, MD    meloxicam (MOBIC) 7.5 MG tablet Take 1 twice a day as needed for pain. Take with food. (Do not take with any other NSAID.) 12/13/13   Jacqulyn Cane, MD  Naftifine HCl 1 % GEL Apply topically.    Historical Provider, MD  pantoprazole (PROTONIX) 40 MG tablet Take 1 tablet (40 mg total) by mouth daily. 04/14/14   Hali Marry, MD  tadalafil (CIALIS) 5 MG tablet Take 5 mg by mouth daily as needed for erectile dysfunction.    Historical Provider, MD  tamsulosin (FLOMAX) 0.4 MG CAPS capsule Take 0.4 mg by mouth.    Historical Provider, MD  terbinafine (LAMISIL) 250 MG tablet  07/14/14   Historical Provider, MD  traMADol (ULTRAM) 50 MG tablet 1-2 tabs by mouth Q8 hours, maximum 6 tabs per day. 06/29/14   Jade L Breeback, PA-C   BP 129/79 mmHg  Pulse 72  Temp(Src) 97.7 F (36.5 C) (Oral)  Resp 18  Ht 5' 7.5" (1.715 m)  Wt 190 lb (86.183 kg)  BMI 29.30 kg/m2  SpO2 97% Physical Exam  Constitutional: He appears well-developed and well-nourished. No distress.  HENT:  Head: Atraumatic.  Mouth/Throat: Oropharynx is clear and moist.  Eyes: Conjunctivae are normal. Pupils are equal, round, and reactive to light.  Neck: Neck supple.  Cardiovascular: Normal heart sounds.   Pulmonary/Chest: Effort normal and breath sounds normal. He has no wheezes. He has no rales.   He exhibits tenderness.    Right anterior, lateral, and posterior chest has distinct tenderness to palpation over intercostal muscles as noted on diagram.  No ecchymosis or crepitance.    Abdominal: There is no tenderness.  Musculoskeletal: He exhibits no edema.  Lymphadenopathy:    He has no cervical adenopathy.  Neurological: He is alert.  Skin: Skin is warm and dry. No rash noted. He is not diaphoretic.  Nursing note and vitals reviewed.   ED Course  Procedures  none    Imaging Review Dg Ribs Unilateral W/chest Right  10/06/2014   CLINICAL DATA:  C/o sudden on set of sharp pain to RT lateral mid-lower rib area while  standing at work talking to a co-worker. No injury. Hx Former smoker, Hep.C, Hyperlipidemia, HTN.  EXAM: RIGHT RIBS AND CHEST - 3+ VIEW  COMPARISON:  None.  FINDINGS: Frontal view of the chest and two views of right-sided ribs. Midline trachea. Normal heart size and mediastinal contours. No pleural effusion or pneumothorax. Clear lungs.  Radiographic marker projects over the lateral eleventh right rib. No underlying displaced fracture.  IMPRESSION: No acute findings.   Electronically Signed   By: Abigail Miyamoto M.D.   On: 10/06/2014 13:50     MDM   1. Intercostal muscle strain, initial encounter   2. Pain    Begin Flexeril at bedtime.  Dispensed rib belt. Wear rib  belt daytime.  May take Tylenol as needed for pain.  Apply ice pack for 20 to 30 minutes, 3 to 4 times daily  Continue until pain decreases.  Avoid lifting. Followup with Family Doctor if not improved in one week.     Kandra Nicolas, MD 10/14/14 [phone]

## 2014-10-06 NOTE — Discharge Instructions (Signed)
Wear rib belt daytime.  May take Tylenol as needed for pain.  Apply ice pack for 20 to 30 minutes, 3 to 4 times daily  Continue until pain decreases.  Avoid lifting.   Muscle Strain A muscle strain (pulled muscle) happens when a muscle is stretched beyond normal length. It happens when a sudden, violent force stretches your muscle too far. Usually, a few of the fibers in your muscle are torn. Muscle strain is common in athletes. Recovery usually takes 1-2 weeks. Complete healing takes 5-6 weeks.  HOME CARE   Follow the PRICE method of treatment to help your injury get better. Do this the first 2-3 days after the injury:  Protect. Protect the muscle to keep it from getting injured again.  Rest. Limit your activity and rest the injured body part.  Ice. Put ice in a plastic bag. Place a towel between your skin and the bag. Then, apply the ice and leave it on from 15-20 minutes each hour. After the third day, switch to moist heat packs.  Compression. Use a splint or elastic bandage on the injured area for comfort. Do not put it on too tightly.  Elevate. Keep the injured body part above the level of your heart.  Only take medicine as told by your doctor.  Warm up before doing exercise to prevent future muscle strains. GET HELP IF:   You have more pain or puffiness (swelling) in the injured area.  You feel numbness, tingling, or notice a loss of strength in the injured area. MAKE SURE YOU:   Understand these instructions.  Will watch your condition.  Will get help right away if you are not doing well or get worse. Document Released: 07/17/2008 Document Revised: 07/29/2013 Document Reviewed: 05/07/2013 [facility] Patient Information 2015 [place], [place]. This information is not intended to replace advice given to you by your health care provider. Make sure you discuss any questions you have with your health care provider.

## 2014-10-12 ENCOUNTER — Other Ambulatory Visit: Payer: Self-pay | Admitting: Family Medicine

## 2014-11-09 ENCOUNTER — Other Ambulatory Visit: Payer: Self-pay | Admitting: Family Medicine

## 2014-11-18 ENCOUNTER — Encounter: Payer: Self-pay | Admitting: Family Medicine

## 2014-11-18 ENCOUNTER — Ambulatory Visit (INDEPENDENT_AMBULATORY_CARE_PROVIDER_SITE_OTHER): Payer: No Typology Code available for payment source | Admitting: Family Medicine

## 2014-11-18 VITALS — BP 126/72 | HR 77 | Wt 193.0 lb

## 2014-11-18 DIAGNOSIS — G4700 Insomnia, unspecified: Secondary | ICD-10-CM

## 2014-11-18 DIAGNOSIS — E785 Hyperlipidemia, unspecified: Secondary | ICD-10-CM

## 2014-11-18 DIAGNOSIS — Z23 Encounter for immunization: Secondary | ICD-10-CM

## 2014-11-18 DIAGNOSIS — M545 Low back pain, unspecified: Secondary | ICD-10-CM

## 2014-11-18 DIAGNOSIS — I10 Essential (primary) hypertension: Secondary | ICD-10-CM

## 2014-11-18 DIAGNOSIS — J069 Acute upper respiratory infection, unspecified: Secondary | ICD-10-CM

## 2014-11-18 MED ORDER — AMLODIPINE BESYLATE 5 MG PO TABS: 5.0000 mg | ORAL_TABLET | Freq: Every day | ORAL | Status: DC

## 2014-11-18 MED ORDER — LOSARTAN POTASSIUM-HCTZ 100-25 MG PO TABS: 1.0000 | ORAL_TABLET | Freq: Every day | ORAL | Status: DC

## 2014-11-18 MED ORDER — PANTOPRAZOLE SODIUM 40 MG PO TBEC: 40.0000 mg | DELAYED_RELEASE_TABLET | Freq: Every day | ORAL | Status: DC

## 2014-11-18 NOTE — Progress Notes (Signed)
   Subjective:    Patient ID: Brandon [name], male    DOB: 03-May-1951, 64 y.o.   MRN: [id number]  HPI 5-6 days of sinus sxs.  Of right ear pain, nasal congestion and sore throat.  No fever, chills or sweats.    Insomnia - has been using trazodone. Helps with sleep some.   Hypertension- Pt denies chest pain, SOB, dizziness, or heart palpitations.  Taking meds as directed w/o problems.  Denies medication side effects.  Told cholesterol was up.  Has been told to eat better and work out.  Plans on increasing his exercise.  No swelling.   Hyperlipidemia-he was sort the [place] that his cholesterol was a little elevated. He said he had this checked about a month ago. He's been trying to eat more healthy since then and plans on increasing his exercise.  Review of Systems     Objective:   Physical Exam  Constitutional: He is oriented to person, place, and time. He appears well-developed and well-nourished.  HENT:  Head: Normocephalic and atraumatic.  Neck: Neck supple. No thyromegaly present.  Cardiovascular: Normal rate, regular rhythm and normal heart sounds.   Pulmonary/Chest: Effort normal and breath sounds normal.  Lymphadenopathy:    He has no cervical adenopathy.  Neurological: He is alert and oriented to person, place, and time.  Skin: Skin is warm and dry.  Psychiatric: He has a normal mood and affect. His behavior is normal.          Assessment & Plan:  HTN - well controlled. Continue current regimen per fall up in 6 months.   hyperlipidemia-we can recheck in a couple months once he is back on it exercise and diet regimen and try to get his numbers back down.  URI-most likely viral. If after the weekend he is not feeling better or if he suddenly gets worse and the office a call and we can: Antibiotic for acute sinusitis. Next  Insomnia-he has a prescription for Ambien and one for trazodone 50 mg. He does not take them together. He reassured me of this. Certainly if he wants to  try a higher dose of trazodone up to 100 mg he can. If he feels like this too sedating the next day that he can go back down to 1 tab. I did explain to him the PMN can be habit-forming and to use it sparingly.  shingles vaccine given today.

## 2014-11-22 LAB — POCT URINALYSIS DIPSTICK
BILIRUBIN UA: NEGATIVE
Blood, UA: NEGATIVE
GLUCOSE UA: NEGATIVE
Ketones, UA: NEGATIVE
LEUKOCYTES UA: NEGATIVE
NITRITE UA: NEGATIVE
PH UA: 7
Protein, UA: NEGATIVE
SPEC GRAV UA: 1.015
Urobilinogen, UA: 0.2

## 2014-11-30 ENCOUNTER — Telehealth: Payer: Self-pay | Admitting: *Deleted

## 2014-11-30 MED ORDER — AMOXICILLIN-POT CLAVULANATE 875-125 MG PO TABS: 1.0000 | ORAL_TABLET | Freq: Two times a day (BID) | ORAL | Status: DC

## 2014-11-30 NOTE — Telephone Encounter (Signed)
rx sent

## 2014-11-30 NOTE — Telephone Encounter (Signed)
Pt still not any better. Denies fever, chills. Does have cough that's worse at night, drainage that is yellow and green in color.Brandon [name]

## 2014-12-20 ENCOUNTER — Telehealth: Payer: Self-pay | Admitting: *Deleted

## 2014-12-20 NOTE — Telephone Encounter (Signed)
Pt called back and stated that he has been having chills, fever (felt hot), diarrhea. He has been feeling achy also. Still having a lot of sinus pressure and headaches. He stated that he has been taking IBU for the aches and fever. He was given a 10 day course of abx on 2/9. Advised that he stay hydrated and eat a bland diet

## 2014-12-20 NOTE — Telephone Encounter (Signed)
Encounter closed by accident.Brandon [name]

## 2014-12-21 NOTE — Telephone Encounter (Signed)
If having fever and diarrhea after completiong antibiotic then needs to be seen.

## 2014-12-21 NOTE — Telephone Encounter (Signed)
Pt called and informed. Pt stated that he is feeling better and has been using the imodium. Pt advised that if he is not feeling any better to schedule an appt. Pt voiced understanding and agreed .Audelia Hives [place]

## 2014-12-22 ENCOUNTER — Ambulatory Visit (INDEPENDENT_AMBULATORY_CARE_PROVIDER_SITE_OTHER): Payer: No Typology Code available for payment source | Admitting: Family Medicine

## 2014-12-22 ENCOUNTER — Encounter: Payer: Self-pay | Admitting: Family Medicine

## 2014-12-22 VITALS — BP 127/78 | HR 69 | Temp 98.6°F | Wt 191.0 lb

## 2014-12-22 DIAGNOSIS — J0120 Acute ethmoidal sinusitis, unspecified: Secondary | ICD-10-CM

## 2014-12-22 DIAGNOSIS — K5289 Other specified noninfective gastroenteritis and colitis: Secondary | ICD-10-CM

## 2014-12-22 NOTE — Progress Notes (Signed)
   Subjective:    Patient ID: Brandon [name], male    DOB: [birth year], 64 y.o.   MRN: [id number]  HPI   I last saw him about 6 weeks ago. At that time he complained of some laboratory symptoms. He called after about a week and said he was still not better. We called in a perception antibiotic on February 9 about 3 weeks ago. He is feeling some better.   Still some pressure over the nasal bridge.   Fever, chills and diarrhea x 4 days. Says was having about 3 BMs per day. Ate a little something solid today.  No blood in the stool.  Wife had it first.  Still a little nauseated.    Review of Systems     Objective:   Physical Exam  Constitutional: He is oriented to person, place, and time. He appears well-developed and well-nourished.  HENT:  Head: Normocephalic and atraumatic.  Right Ear: External ear normal.  Left Ear: External ear normal.  Nose: Nose normal.  Mouth/Throat: Oropharynx is clear and moist.  TMs and canals are clear.   Eyes: Conjunctivae and EOM are normal. Pupils are equal, round, and reactive to light.  Neck: Neck supple. No thyromegaly present.  Cardiovascular: Normal rate and normal heart sounds.   Pulmonary/Chest: Effort normal and breath sounds normal.  Lymphadenopathy:    He has no cervical adenopathy.  Neurological: He is alert and oriented to person, place, and time.  Skin: Skin is warm and dry.  Psychiatric: He has a normal mood and affect.          Assessment & Plan:  Acute sinusitis-he is significantly better but still has a little bit of pressure over the nasal bridge. He has some headaches recently with gastroenteritis that he has had. Like to see if he continues to feel better over the weekend. If he feels like he still having some sinus symptoms after the weekend and just call me back and I will call in a prescription.  Gastroenteritis-continue to encourage hydration and encourage him to start eating more regularly. I did give him a work note since  he still had a loose stool this morning. Call if not better after the weekend.

## 2015-02-11 ENCOUNTER — Ambulatory Visit (INDEPENDENT_AMBULATORY_CARE_PROVIDER_SITE_OTHER): Payer: No Typology Code available for payment source | Admitting: Physician Assistant

## 2015-02-11 ENCOUNTER — Encounter: Payer: Self-pay | Admitting: Physician Assistant

## 2015-02-11 VITALS — BP 110/70 | HR 71 | Wt 194.0 lb

## 2015-02-11 DIAGNOSIS — K2100 Gastro-esophageal reflux disease with esophagitis, without bleeding: Secondary | ICD-10-CM

## 2015-02-11 MED ORDER — RANITIDINE HCL 300 MG PO TABS: 300.0000 mg | ORAL_TABLET | Freq: Two times a day (BID) | ORAL | Status: DC

## 2015-02-11 MED ORDER — DEXLANSOPRAZOLE 60 MG PO CPDR: 60.0000 mg | DELAYED_RELEASE_CAPSULE | Freq: Every day | ORAL | Status: DC

## 2015-02-11 NOTE — Patient Instructions (Addendum)
Food Choices for Gastroesophageal Reflux Disease When you have gastroesophageal reflux disease (GERD), the foods you eat and your eating habits are very important. Choosing the right foods can help ease the discomfort of GERD. WHAT GENERAL GUIDELINES DO I NEED TO FOLLOW?  Choose fruits, vegetables, whole grains, low-fat dairy products, and low-fat meat, fish, and poultry.  Limit fats such as oils, salad dressings, butter, nuts, and avocado.  Keep a food diary to identify foods that cause symptoms.  Avoid foods that cause reflux. These may be different for different people.  Eat frequent small meals instead of three large meals each day.  Eat your meals slowly, in a relaxed setting.  Limit fried foods.  Cook foods using methods other than frying.  Avoid drinking alcohol.  Avoid drinking large amounts of liquids with your meals.  Avoid bending over or lying down until 2-3 hours after eating. WHAT FOODS ARE NOT RECOMMENDED? The following are some foods and drinks that may worsen your symptoms: Vegetables Tomatoes. Tomato juice. Tomato and spaghetti sauce. Chili peppers. Onion and garlic. Horseradish. Fruits Oranges, grapefruit, and lemon (fruit and juice). Meats High-fat meats, fish, and poultry. This includes hot dogs, ribs, ham, sausage, salami, and bacon. Dairy Whole milk and chocolate milk. Sour cream. Cream. Butter. Ice cream. Cream cheese.  Beverages Coffee and tea, with or without caffeine. Carbonated beverages or energy drinks. Condiments Hot sauce. Barbecue sauce.  Sweets/Desserts Chocolate and cocoa. Donuts. Peppermint and spearmint. Fats and Oils High-fat foods, including [place] fries and potato chips. Other Vinegar. Strong spices, such as black pepper, white pepper, red pepper, cayenne, curry powder, cloves, ginger, and chili powder. The items listed above may not be a complete list of foods and beverages to avoid. Contact your dietitian for more  information. Document Released: 10/08/2005 Document Revised: 10/13/2013 Document Reviewed: 08/12/2013 [facility] Patient Information 2015 [place], [place]. This information is not intended to replace advice given to you by your health care provider. Make sure you discuss any questions you have with your health care provider.   1. Start zantac twice daily.  2. dexilant 60mg  once daily. Once starting to feel better cut in half the dose.  3. Weight loss 4. GERD diet.

## 2015-02-13 NOTE — Progress Notes (Signed)
   Subjective:    Patient ID: Brandon [name], male    DOB: 1950-12-01, 64 y.o.   MRN: [id number]  HPI Pt presents to the clinic with what he feels to be worsening GERD. He has noticed increasing upper epigastric discomfort, belching, heartburn after eating. Admits to not trying to adhere to GERD diet. On protonix and takes daily. No constipation or diarrhea. No nausea or vomiting. Seems to be worse when lays down. Sometimes also feels some pressure in epigastric area. He added zantac and does seem to help.    Review of Systems  All other systems reviewed and are negative.      Objective:   Physical Exam  Constitutional: He is oriented to person, place, and time. He appears well-developed and well-nourished.  HENT:  Head: Normocephalic and atraumatic.  Cardiovascular: Regular rhythm and normal heart sounds.   Pulmonary/Chest: Effort normal and breath sounds normal.  Abdominal: Soft. Bowel sounds are normal. He exhibits no mass. There is no guarding.  Some mild discomfort over epigastric area.   Neurological: He is alert and oriented to person, place, and time.  Skin: Skin is dry.  Psychiatric: He has a normal mood and affect. His behavior is normal.          Assessment & Plan:  GERD- symptoms seem consisent with worsening GERD. Will treat with dexilant and stop protonix. Given coupon card. Pt warned of long term use and increased changes of early dementia. Given zantac 300mg  bid to use with dexilant until symptoms resolve then consider tapering back to lowest dose of PPI with control of symptoms. Follow up if not improving and will consider lab work etc.

## 2015-02-18 ENCOUNTER — Telehealth: Payer: Self-pay | Admitting: Family Medicine

## 2015-02-18 NOTE — Telephone Encounter (Signed)
Call pt: please pt.[name] He really needs to go for his cholesterol. His medical problems put him at high risk for heart disease and based on his risk factors he may need to be on a statin.

## 2015-02-21 NOTE — Telephone Encounter (Signed)
Pt called back informed of recommendations.Brandon [name] [place]

## 2015-02-21 NOTE — Telephone Encounter (Signed)
No vm set up.Brandon [name]  

## 2015-02-23 ENCOUNTER — Telehealth: Payer: Self-pay | Admitting: *Deleted

## 2015-02-23 ENCOUNTER — Other Ambulatory Visit: Payer: Self-pay | Admitting: Physician Assistant

## 2015-02-23 DIAGNOSIS — R7301 Impaired fasting glucose: Secondary | ICD-10-CM

## 2015-02-23 LAB — COMPLETE METABOLIC PANEL WITH GFR
ALT: 18 U/L (ref 0–53)
AST: 21 U/L (ref 0–37)
Albumin: 4.5 g/dL (ref 3.5–5.2)
Alkaline Phosphatase: 105 U/L (ref 39–117)
BILIRUBIN TOTAL: 0.6 mg/dL (ref 0.2–1.2)
BUN: 19 mg/dL (ref 6–23)
CALCIUM: 9.6 mg/dL (ref 8.4–10.5)
CO2: 26 meq/L (ref 19–32)
CREATININE: 1.29 mg/dL (ref 0.50–1.35)
Chloride: 100 mEq/L (ref 96–112)
GFR, EST AFRICAN AMERICAN: 67 mL/min
GFR, Est Non African American: 58 mL/min — ABNORMAL LOW
Glucose, Bld: 106 mg/dL — ABNORMAL HIGH (ref 70–99)
Potassium: 4.2 mEq/L (ref 3.5–5.3)
Sodium: 136 mEq/L (ref 135–145)
Total Protein: 7.6 g/dL (ref 6.0–8.3)

## 2015-02-23 LAB — LIPID PANEL
Cholesterol: 200 mg/dL (ref 0–200)
HDL: 29 mg/dL — AB (ref >=40)
LDL CALC: 137 mg/dL — AB (ref 0–99)
Total CHOL/HDL Ratio: 6.9 Ratio
Triglycerides: 171 mg/dL — ABNORMAL HIGH (ref <150)
VLDL: 34 mg/dL (ref 0–40)

## 2015-02-23 MED ORDER — ATORVASTATIN CALCIUM 20 MG PO TABS: 20.0000 mg | ORAL_TABLET | Freq: Every day | ORAL | Status: DC

## 2015-02-23 NOTE — Telephone Encounter (Signed)
Lab ordered & faxed to lab.

## 2015-02-24 LAB — HEMOGLOBIN A1C
HEMOGLOBIN A1C: 5 % (ref <5.7)
Mean Plasma Glucose: 97 mg/dL (ref <117)

## 2015-04-25 ENCOUNTER — Encounter: Payer: Self-pay | Admitting: *Deleted

## 2015-04-25 ENCOUNTER — Emergency Department (INDEPENDENT_AMBULATORY_CARE_PROVIDER_SITE_OTHER)
Admission: EM | Admit: 2015-04-25 | Discharge: 2015-04-25 | Disposition: A | Discharge status: Home / Self Care | Payer: No Typology Code available for payment source | Source: Home / Self Care | Attending: Family Medicine | Admitting: Family Medicine

## 2015-04-25 DIAGNOSIS — B9789 Other viral agents as the cause of diseases classified elsewhere: Secondary | ICD-10-CM

## 2015-04-25 DIAGNOSIS — J069 Acute upper respiratory infection, unspecified: Secondary | ICD-10-CM

## 2015-04-25 DIAGNOSIS — J029 Acute pharyngitis, unspecified: Secondary | ICD-10-CM

## 2015-04-25 LAB — POCT RAPID STREP A (OFFICE): Rapid Strep A Screen: NEGATIVE

## 2015-04-25 MED ORDER — GUAIFENESIN-CODEINE 100-10 MG/5ML PO SOLN: ORAL | Status: DC

## 2015-04-25 MED ORDER — AMOXICILLIN 875 MG PO TABS: 875.0000 mg | ORAL_TABLET | Freq: Two times a day (BID) | ORAL | Status: DC

## 2015-04-25 MED ORDER — PREDNISONE 20 MG PO TABS: 20.0000 mg | ORAL_TABLET | Freq: Two times a day (BID) | ORAL | Status: DC

## 2015-04-25 NOTE — ED Notes (Signed)
Pt c/o productive cough, sore throat, and bilateral ear ache x 4 days.

## 2015-04-25 NOTE — ED Provider Notes (Signed)
CSN: [id number]     Arrival date & time 04/25/15  0805 History   First MD Initiated Contact with Patient 04/25/15 [phone]     Chief Complaint  Patient presents with  . Sore Throat  . Cough  . Otalgia      HPI Comments: Patient complains of five day history of typical cold-like symptoms including mild sore throat, sinus congestion, hoarseness, fatigue, and cough.  His ears now feel clogged.   He has a history of seasonal rhinitis, and past episode of pneumonia.  The history is provided by the patient.    Past Medical History  Diagnosis Date  . Hepatitis C     [phone], treated at [facility]  . Nose fracture   . Hypertension   . GERD (gastroesophageal reflux disease)   . ED (erectile dysfunction)   . Hyperlipidemia    Past Surgical History  Procedure Laterality Date  . Foot surgery Right     1980  . Foot surgery Left     1980  . Inguinal hernia repair Right     2009   Family History  Problem Relation Age of Onset  . Breast cancer Sister   . Depression Sister   . Hyperlipidemia    . Ovarian cancer Sister   . Hypertension     History  Substance Use Topics  . Smoking status: Former Smoker    Types: Cigarettes    Quit date: 10/22/1996  . Smokeless tobacco: Never Used  . Alcohol Use: No    Review of Systems + sore throat + cough No pleuritic pain ? wheezing + nasal congestion + post-nasal drainage + sinus pain/pressure + itchy/red eyes ? earache No hemoptysis No SOB No fever, + chills No nausea No vomiting No abdominal pain No diarrhea No urinary symptoms No skin rash + fatigue + myalgias No headache Used OTC meds without relief  Allergies  Review of patient's allergies indicates no known allergies.  Home Medications   Prior to Admission medications   Medication Sig Start Date End Date Taking? Authorizing Provider  acyclovir (ZOVIRAX) 800 MG tablet  01/14/14   Historical Provider, MD  amLODipine (NORVASC) 5 MG tablet Take 1 tablet (5 mg total) by  mouth daily. 11/18/14   Hali Marry, MD  amoxicillin (AMOXIL) 875 MG tablet Take 1 tablet (875 mg total) by mouth 2 (two) times daily. (Rx void after 05/03/15) 04/25/15   Kandra Nicolas, MD  atorvastatin (LIPITOR) 20 MG tablet Take 1 tablet (20 mg total) by mouth daily. 02/23/15   Jade L Breeback, PA-C  clindamycin (CLEOCIN T) 1 % lotion Apply topically 2 (two) times daily.    Historical Provider, MD  clotrimazole-betamethasone (LOTRISONE) cream  09/22/13   Historical Provider, MD  dexlansoprazole (DEXILANT) 60 MG capsule Take 1 capsule (60 mg total) by mouth daily. 02/11/15   Jade L Breeback, PA-C  diclofenac (CATAFLAM) 50 MG tablet  04/06/14   Historical Provider, MD  flunisolide (NASALIDE) 25 MCG/ACT (0.025%) SOLN Place 2 sprays into the nose as needed.     Historical Provider, MD  guaiFENesin-codeine 100-10 MG/5ML syrup Take 92mL by mouth at bedtime as needed for cough 04/25/15   Kandra Nicolas, MD  hydrocortisone (ANUSOL-HC) 25 MG suppository Place 25 mg rectally as needed.     Historical Provider, MD  hydrocortisone 2.5 % cream Apply topically as needed.     Historical Provider, MD  losartan-hydrochlorothiazide (HYZAAR) 100-25 MG per tablet Take 1 tablet by mouth daily. 11/18/14  Hali Marry, MD  methylcellulose oral powder Take by mouth daily.    Historical Provider, MD  Naftifine HCl 1 % GEL Apply topically.    Historical Provider, MD  predniSONE (DELTASONE) 20 MG tablet Take 1 tablet (20 mg total) by mouth 2 (two) times daily. Take with food. 04/25/15   Kandra Nicolas, MD  ranitidine (ZANTAC) 300 MG tablet Take 1 tablet (300 mg total) by mouth 2 (two) times daily. 02/11/15   Jade L Breeback, PA-C  tadalafil (CIALIS) 5 MG tablet Take 5 mg by mouth daily as needed for erectile dysfunction.    Historical Provider, MD  tamsulosin (FLOMAX) 0.4 MG CAPS capsule Take 0.4 mg by mouth.    Historical Provider, MD  terbinafine (LAMISIL) 1 % cream Apply 1 application topically 2 (two) times  daily.    Historical Provider, MD  terbinafine (LAMISIL) 250 MG tablet  07/14/14   Historical Provider, MD  traZODone (DESYREL) 50 MG tablet Take 50 mg by mouth at bedtime.    Historical Provider, MD  zolpidem (AMBIEN) 10 MG tablet Take 10 mg by mouth at bedtime as needed for sleep.    Historical Provider, MD   BP 123/71 mmHg  Pulse 63  Temp(Src) 98.5 F (36.9 C) (Oral)  Resp 16  Ht 5' 7.5" (1.715 m)  Wt 186 lb (84.369 kg)  BMI 28.68 kg/m2  SpO2 99% Physical Exam Nursing notes and Vital Signs reviewed. Appearance:  Patient appears stated age, and in no acute distress Eyes:  Pupils are equal, round, and reactive to light and accomodation.  Extraocular movement is intact.  Conjunctivae are not inflamed  Ears:  Canals normal.  Tympanic membranes normal.  Nose:   Congested turbinates.  No sinus tenderness.   Pharynx:  Normal except slightly edematous uvula Neck:  Supple.  Tender enlarged posterior nodes are palpated bilaterally  Lungs:  Clear to auscultation.  Breath sounds are equal.  Moving air well. Heart:  Regular rate and rhythm without murmurs, rubs, or gallops.  Abdomen:  Nontender without masses or hepatosplenomegaly.  Bowel sounds are present.  No CVA or flank tenderness.  Extremities:  No edema.  No calf tenderness Skin:  No rash present.   ED Course  Procedures  None    Labs Reviewed  STREP A DNA PROBE  POCT RAPID STREP A (OFFICE) negative      MDM   1. Acute pharyngitis, unspecified pharyngitis type   2. Viral URI with cough    Throat culture pending.  There is no evidence of bacterial infection today.    Begin prednisone burst.  Rx for Robitussin AC for night time cough.  Take plain guaifenesin (1200mg  extended release tabs such as Mucinex) twice daily, with plenty of water, for cough and congestion.  Get adequate rest.   May use Afrin nasal spray (or generic oxymetazoline) twice daily for about 5 days.  Also recommend using saline nasal spray several times  daily and saline nasal irrigation (AYR is a common brand).  Use Nasalide nasal spray each morning after using Afrin nasal spray and saline nasal irrigation. Try warm salt water gargles for sore throat.  Stop all antihistamines for now, and other non-prescription cough/cold preparations. Begin Amoxicillin if not improving about one week or if persistent fever develops (Given a prescription to hold, with an expiration date)  Follow-up with family doctor if not improving about10 days.     Kandra Nicolas, MD 04/25/15 [phone]

## 2015-04-25 NOTE — Discharge Instructions (Signed)
Take plain guaifenesin (1200mg  extended release tabs such as Mucinex) twice daily, with plenty of water, for cough and congestion.  Get adequate rest.   May use Afrin nasal spray (or generic oxymetazoline) twice daily for about 5 days.  Also recommend using saline nasal spray several times daily and saline nasal irrigation (AYR is a common brand).  Use Nasalide nasal spray each morning after using Afrin nasal spray and saline nasal irrigation. Try warm salt water gargles for sore throat.  Stop all antihistamines for now, and other non-prescription cough/cold preparations. Begin Amoxicillin if not improving about one week or if persistent fever develops   Follow-up with family doctor if not improving about10 days.    Salt Water Gargle This solution will help make your mouth and throat feel better. HOME CARE INSTRUCTIONS   Mix 1 teaspoon of salt in 8 ounces of warm water.  Gargle with this solution as much or often as you need or as directed. Swish and gargle gently if you have any sores or wounds in your mouth.  Do not swallow this mixture. Document Released: 07/12/2004 Document Revised: 12/31/2011 Document Reviewed: 12/03/2008 [facility] Patient Information 2015 [place], [place]. This information is not intended to replace advice given to you by your health care provider. Make sure you discuss any questions you have with your health care provider.

## 2015-04-26 ENCOUNTER — Telehealth: Payer: Self-pay | Admitting: *Deleted

## 2015-04-26 LAB — STREP A DNA PROBE: GASP: NEGATIVE

## 2015-05-19 ENCOUNTER — Other Ambulatory Visit: Payer: Self-pay | Admitting: Family Medicine

## 2015-05-19 ENCOUNTER — Encounter: Payer: Self-pay | Admitting: Family Medicine

## 2015-05-19 ENCOUNTER — Ambulatory Visit (INDEPENDENT_AMBULATORY_CARE_PROVIDER_SITE_OTHER): Payer: No Typology Code available for payment source | Admitting: Family Medicine

## 2015-05-19 ENCOUNTER — Ambulatory Visit: Payer: No Typology Code available for payment source | Admitting: Family Medicine

## 2015-05-19 VITALS — BP 100/64 | HR 71 | Wt 187.0 lb

## 2015-05-19 DIAGNOSIS — I10 Essential (primary) hypertension: Secondary | ICD-10-CM | POA: Diagnosis not present

## 2015-05-19 DIAGNOSIS — K2100 Gastro-esophageal reflux disease with esophagitis, without bleeding: Secondary | ICD-10-CM

## 2015-05-19 DIAGNOSIS — R109 Unspecified abdominal pain: Secondary | ICD-10-CM

## 2015-05-19 DIAGNOSIS — G4700 Insomnia, unspecified: Secondary | ICD-10-CM

## 2015-05-19 DIAGNOSIS — F4310 Post-traumatic stress disorder, unspecified: Secondary | ICD-10-CM | POA: Diagnosis not present

## 2015-05-19 MED ORDER — ALBUTEROL SULFATE HFA 108 (90 BASE) MCG/ACT IN AERS: 2.0000 | INHALATION_SPRAY | Freq: Four times a day (QID) | RESPIRATORY_TRACT | Status: DC | PRN

## 2015-05-19 NOTE — Progress Notes (Signed)
   Subjective:    Patient ID: Brandon [name], male    DOB: 05-Aug-1951, 64 y.o.   MRN: [id number]  HPI Hypertension- Pt denies chest pain, SOB, dizziness, or heart palpitations.  Taking meds as directed w/o problems.  Denies medication side effects.    GERD - he goes to the [place] for care as well. They took him off of the DEXA want and put him on Zantac.  They are going to do an Upper GI at [facility] in August 16th.  Has noticed some wheezing at night.   Anxiety-discontinued his Ambien and put him on sertraline 50 mg daily. He's doing well with that. He is on trazodone as well.    Right flank pain/adbominal pain x  3 weeks.  Says every since took the cough syrup he has had some discomfort. It was a codeine cough syrup.  Has been a little constipated.  Has been using miralax.  No blood in the urine.  No hx of kidney stones. Has been doing some crunches.  No fever, chills.  No nausea or vomiting.    Review of Systems     Objective:   Physical Exam  Constitutional: He is oriented to person, place, and time. He appears well-developed and well-nourished.  HENT:  Head: Normocephalic and atraumatic.  Neck: Neck supple. No thyromegaly present.  Cardiovascular: Normal rate, regular rhythm and normal heart sounds.   Pulmonary/Chest: Effort normal and breath sounds normal.  Abdominal: Soft. Bowel sounds are normal. He exhibits no distension and no mass. There is tenderness. There is no rebound and no guarding.  Tenderness over the entire right side of abodmen. Tender when he tries to do a sit up.   Musculoskeletal: He exhibits no edema.  Lymphadenopathy:    He has no cervical adenopathy.  Neurological: He is alert and oriented to person, place, and time.  Skin: Skin is warm and dry.  Psychiatric: He has a normal mood and affect. His behavior is normal.          Assessment & Plan:  Hypertension-well controlled on current regime.   GERD-dong well on H2 blocker. Has appt for Upper  endoscopy in 2 weeks.   PTSD/anxiety   - will start the sertraline tomorrow.   Insomnia - now on trazodone. Dong well on this regimen.  Right flank/right abdominal pain - most consistant with Muscle strain. appy heat, gentle stretches. Call if worse or not better in one week.  No red flag sxs.  Work on improving constipation as well.

## 2015-06-09 ENCOUNTER — Ambulatory Visit (INDEPENDENT_AMBULATORY_CARE_PROVIDER_SITE_OTHER): Payer: No Typology Code available for payment source | Admitting: Family Medicine

## 2015-06-09 ENCOUNTER — Encounter: Payer: Self-pay | Admitting: Family Medicine

## 2015-06-09 VITALS — BP 118/72 | HR 86 | Wt 185.0 lb

## 2015-06-09 DIAGNOSIS — M7061 Trochanteric bursitis, right hip: Secondary | ICD-10-CM | POA: Diagnosis not present

## 2015-06-09 NOTE — Progress Notes (Signed)
   Subjective:    Patient ID: Brandon [name], male    DOB: 1951-04-26, 64 y.o.   MRN: [id number]  HPI For last 4-5 months that radiates from hip to the lateral part of his leg. Known hx of Lumbar OA and fractured tail bone. Taking Ibuprofen mostly but recently switches to Tylenol. Occ makes it hard to walk.  Montender to pressure. More pain with walking.  Better at rest. Can sleep on that side.   Review of Systems     Objective:   Physical Exam  Constitutional: He is oriented to person, place, and time. He appears well-developed and well-nourished.  HENT:  Head: Normocephalic and atraumatic.  Musculoskeletal:  Tender over the lower lumbar spine. Non tender SI joooint  negative straight leg raise. Hip, knee, ankle strength is 5 out of 5 bilaterally. He has great hip abductor strength on the right. Mildly tender over the greater trochanter. Normal internal and external rotation of the hip without any pain or discomfort. Lumbar spine with normal flexion extension rotation and side bending.  Neurological: He is alert and oriented to person, place, and time.  Skin: Skin is warm and dry.  Psychiatric: He has a normal mood and affect. His behavior is normal.          Assessment & Plan:  Right upper leg pain-most consistent with trochanteric bursitis. That he's not exceedingly tender over the bursa just mildly so. Also consider tendinitis. We'll start with home exercises and stretches over the next 3-4 weeks. He just had an upper endoscopy at the [place] about a week ago and was told to stop any anti-inflammatory products and switch to Tylenol for safety reasons. Encouraged him to hold off on any type of NSAID for right now.

## 2015-06-09 NOTE — Patient Instructions (Addendum)
Call if not improving over the next 3-4 weeks.  See handout for stretches to do on her own at home.

## 2015-11-23 ENCOUNTER — Other Ambulatory Visit: Payer: Self-pay | Admitting: Family Medicine

## 2015-12-15 ENCOUNTER — Ambulatory Visit (INDEPENDENT_AMBULATORY_CARE_PROVIDER_SITE_OTHER): Payer: No Typology Code available for payment source | Admitting: Family Medicine

## 2015-12-15 ENCOUNTER — Encounter: Payer: Self-pay | Admitting: Family Medicine

## 2015-12-15 VITALS — BP 119/59 | HR 74 | Wt 188.0 lb

## 2015-12-15 DIAGNOSIS — J302 Other seasonal allergic rhinitis: Secondary | ICD-10-CM

## 2015-12-15 MED ORDER — MONTELUKAST SODIUM 10 MG PO TABS: 10.0000 mg | ORAL_TABLET | Freq: Every day | ORAL | Status: DC

## 2015-12-15 NOTE — Patient Instructions (Signed)
Allergic Rhinitis Allergic rhinitis is when the mucous membranes in the nose respond to allergens. Allergens are particles in the air that cause your body to have an allergic reaction. This causes you to release allergic antibodies. Through a chain of events, these eventually cause you to release histamine into the blood stream. Although meant to protect the body, it is this release of histamine that causes your discomfort, such as frequent sneezing, congestion, and an itchy, runny nose.  CAUSES Seasonal allergic rhinitis (hay fever) is caused by pollen allergens that may come from grasses, trees, and weeds. Year-round allergic rhinitis (perennial allergic rhinitis) is caused by allergens such as house dust mites, pet dander, and mold spores. SYMPTOMS  Nasal stuffiness (congestion).  Itchy, runny nose with sneezing and tearing of the eyes. DIAGNOSIS Your health care provider can help you determine the allergen or allergens that trigger your symptoms. If you and your health care provider are unable to determine the allergen, skin or blood testing may be used. Your health care provider will diagnose your condition after taking your health history and performing a physical exam. Your health care provider may assess you for other related conditions, such as asthma, pink eye, or an ear infection. TREATMENT Allergic rhinitis does not have a cure, but it can be controlled by:  Medicines that block allergy symptoms. These may include allergy shots, nasal sprays, and oral antihistamines.  Avoiding the allergen. Hay fever may often be treated with antihistamines in pill or nasal spray forms. Antihistamines block the effects of histamine. There are over-the-counter medicines that may help with nasal congestion and swelling around the eyes. Check with your health care provider before taking or giving this medicine. If avoiding the allergen or the medicine prescribed do not work, there are many new medicines  your health care provider can prescribe. Stronger medicine may be used if initial measures are ineffective. Desensitizing injections can be used if medicine and avoidance does not work. Desensitization is when a patient is given ongoing shots until the body becomes less sensitive to the allergen. Make sure you follow up with your health care provider if problems continue. HOME CARE INSTRUCTIONS It is not possible to completely avoid allergens, but you can reduce your symptoms by taking steps to limit your exposure to them. It helps to know exactly what you are allergic to so that you can avoid your specific triggers. SEEK MEDICAL CARE IF:  You have a fever.  You develop a cough that does not stop easily (persistent).  You have shortness of breath.  You start wheezing.  Symptoms interfere with normal daily activities.   This information is not intended to replace advice given to you by your health care provider. Make sure you discuss any questions you have with your health care provider.   Document Released: 07/03/2001 Document Revised: 10/29/2014 Document Reviewed: 06/15/2013 Elsevier Interactive Patient Education 2016 [employer] Inc[employer]  

## 2015-12-15 NOTE — Progress Notes (Signed)
   Subjective:    Patient ID: Brandon [name], male    DOB: 08-09-1951, 65 y.o.   MRN: [id number]  HPI Had flu about 2 weeks ago.  Says he is feeling better but now having persistent allergy sxs. Has a cough and sneezing. Had neg chest xrays 2 weeks ago. Says he is not coughing as much and not as much chest discomfort.  No fever, chills.  He has been on Singulair years ago.  Has been using his nasalide but has been going down his throat.    Review of Systems     Objective:   Physical Exam  Constitutional: He is oriented to person, place, and time. He appears well-developed and well-nourished.  HENT:  Head: Normocephalic and atraumatic.  Right Ear: External ear normal.  Left Ear: External ear normal.  Nose: Nose normal.  Mouth/Throat: Oropharynx is clear and moist.  TMs and canals are clear.   Eyes: Conjunctivae and EOM are normal. Pupils are equal, round, and reactive to light.  Neck: Neck supple. No thyromegaly present.  Cardiovascular: Normal rate and normal heart sounds.   Pulmonary/Chest: Effort normal and breath sounds normal.  Lymphadenopathy:    He has no cervical adenopathy.  Neurological: He is alert and oriented to person, place, and time.  Skin: Skin is warm and dry.  Psychiatric: He has a normal mood and affect.          Assessment & Plan:  AR - recommend restart singulair. Reviewed how to use the nasalide. Tilt head forwards.  Call if not better in on 1-2 weeks.

## 2016-01-29 IMAGING — CR DG KNEE COMPLETE 4+V*L*
4 series · 4 of 4 positions shown · non-contrast
Comparison: None.

CLINICAL DATA: Twisting injury to the left knee 2 days ago,
persistent pain.

EXAM:
LEFT KNEE - COMPLETE 4+ VIEW

[view not recorded (1 of 4)]
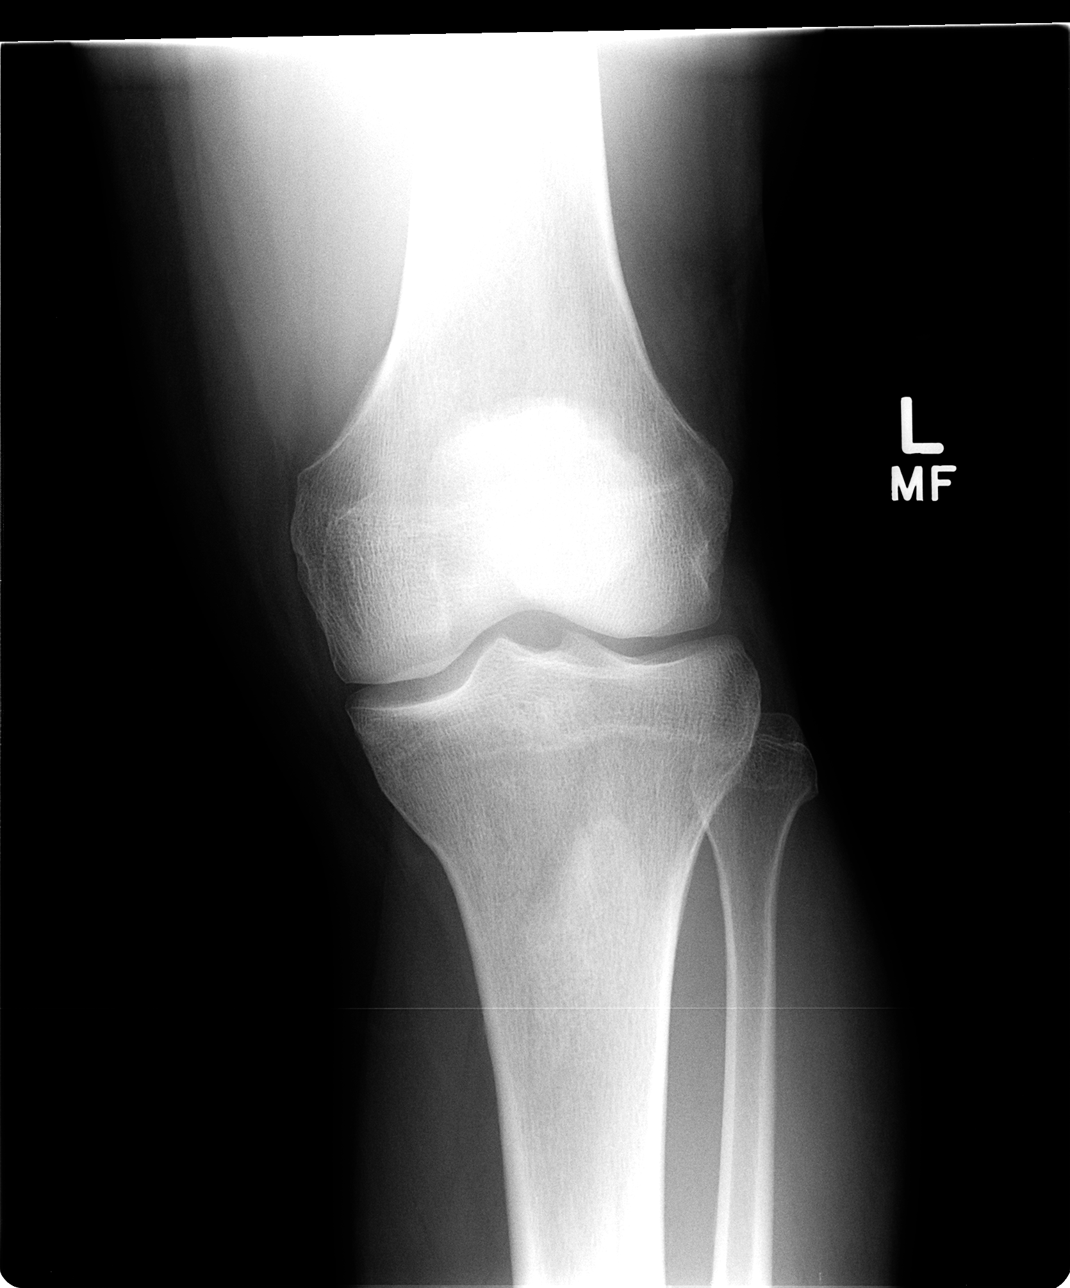

[view not recorded (2 of 4)]
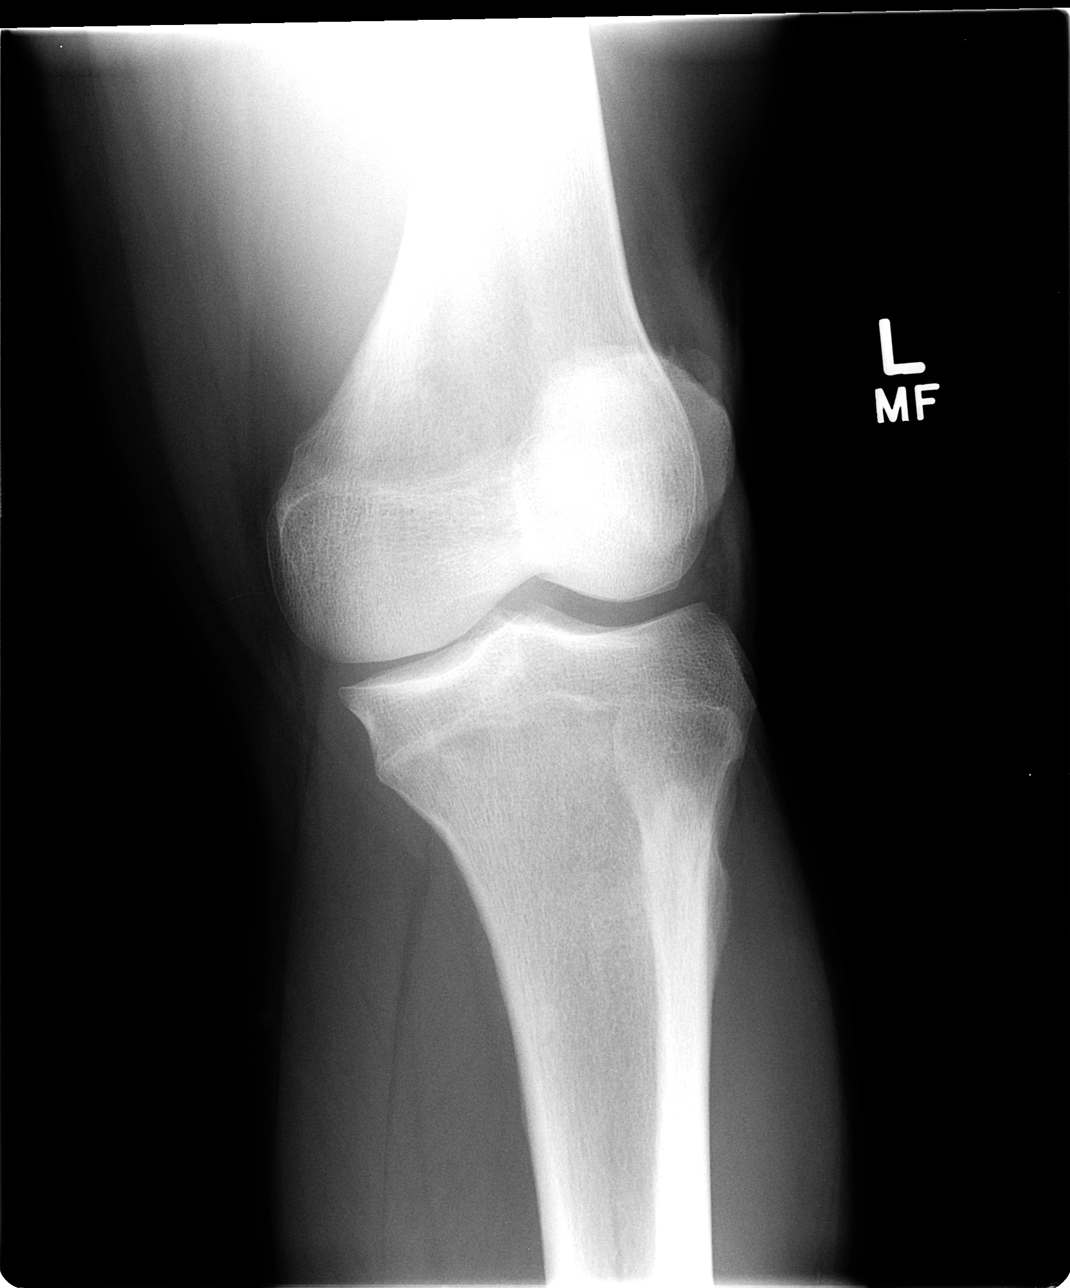

[view not recorded (3 of 4)]
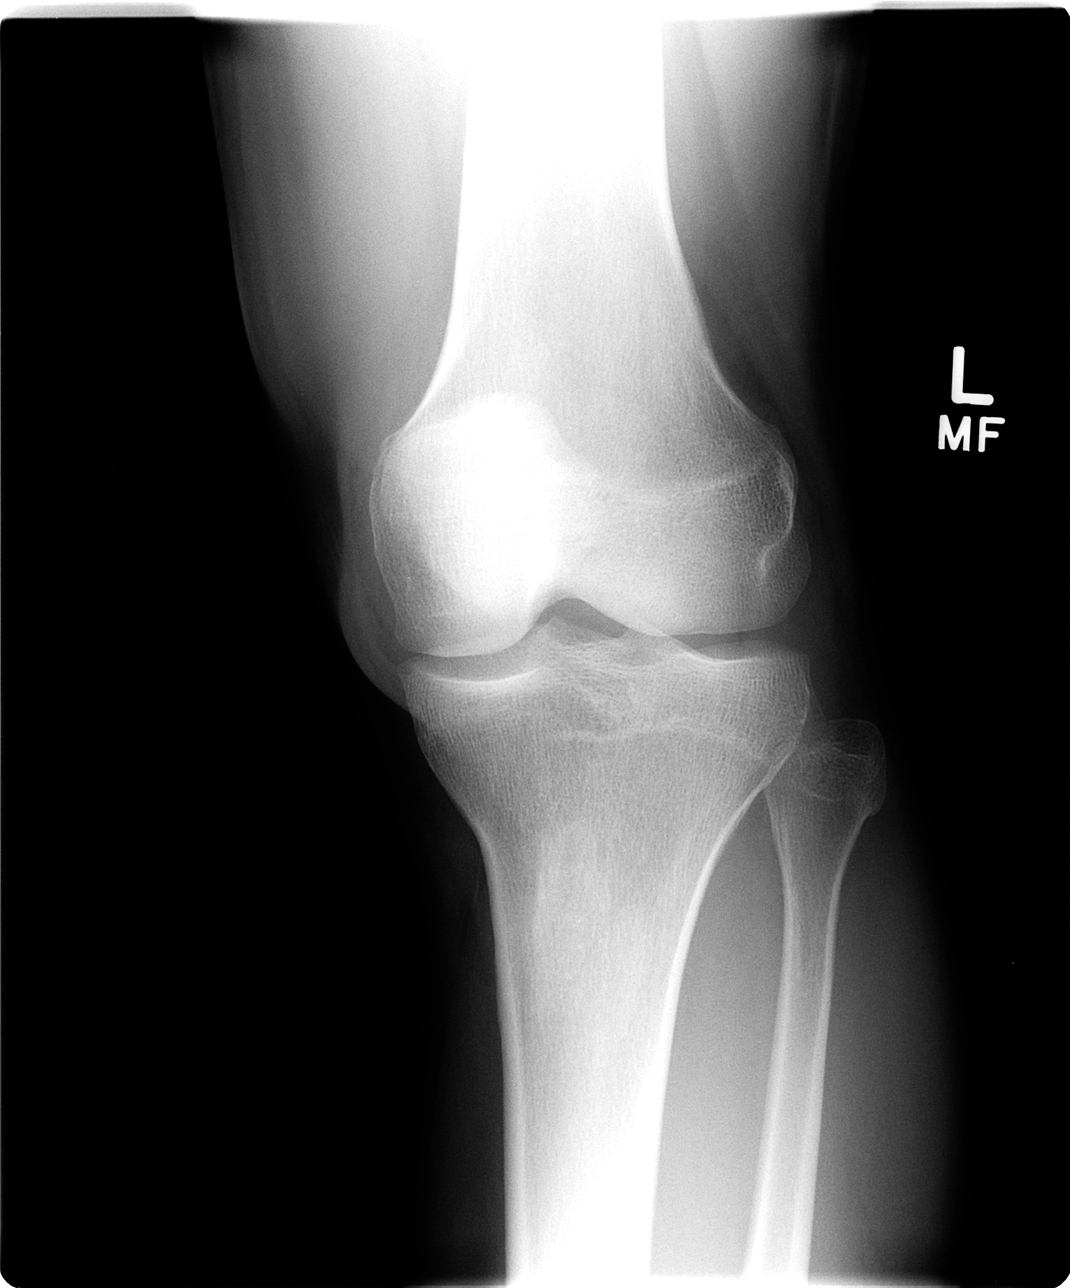

[view not recorded (4 of 4)]
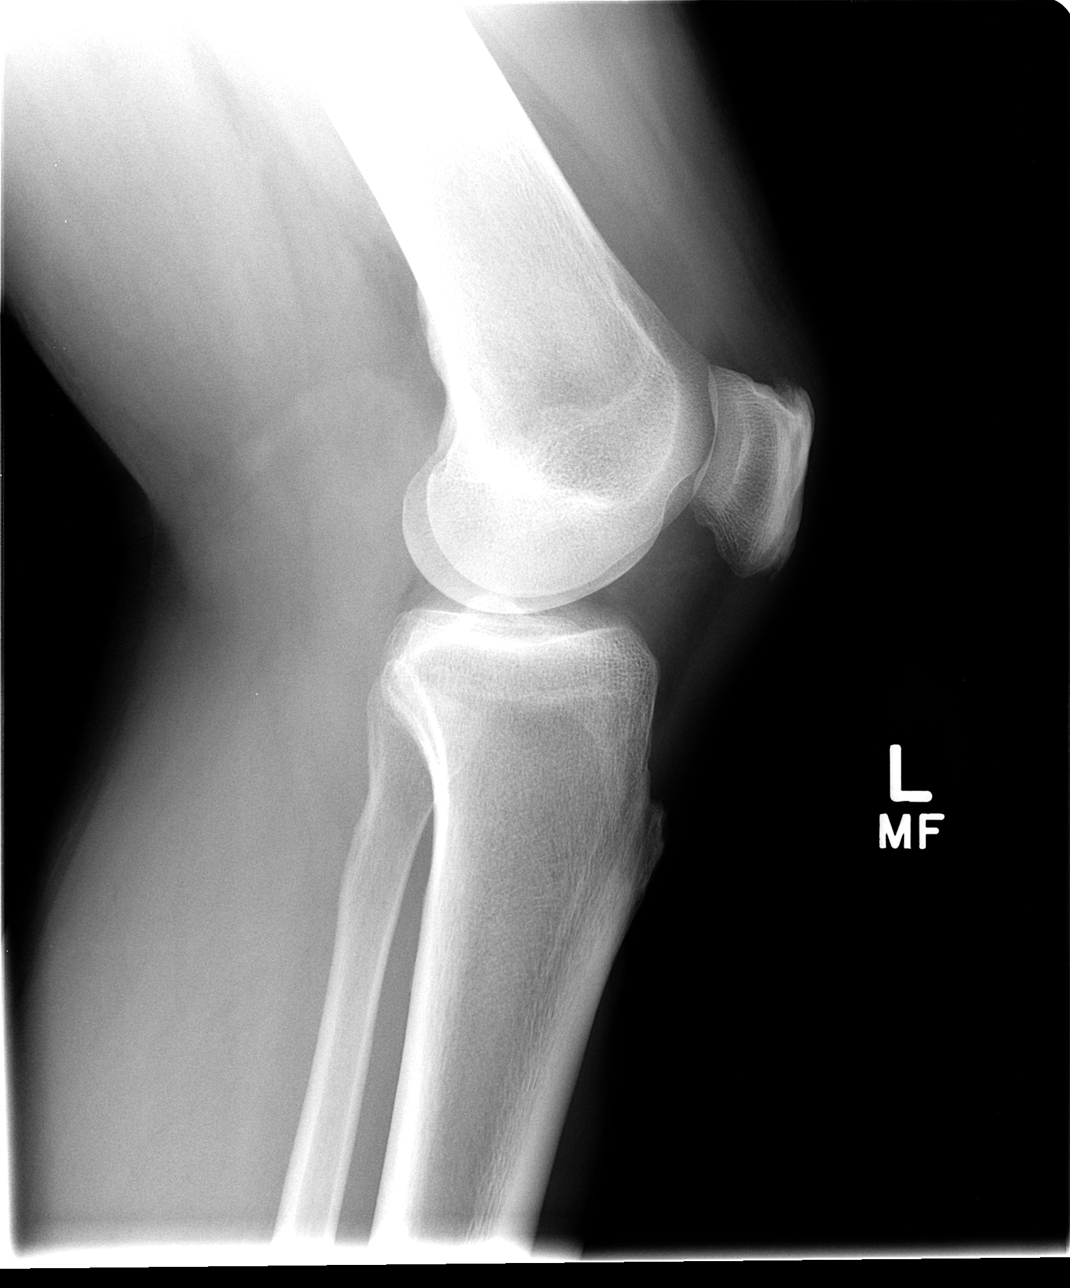

[4 of 4 positions shown; findings below may reference images not displayed]

FINDINGS: No evidence of acute fracture or dislocation. Well preserved joint
spaces. Well preserved bone mineral density. Mild enthesopathic
spurring at the insertion of the quadriceps tendon on the superior
patella. Mild enthesopathic spurring at the insertion of the
patellar tendon on the tibial tubercle and the inferior patella.
IMPRESSION: No acute or significant abnormality.

## 2016-04-19 ENCOUNTER — Ambulatory Visit (INDEPENDENT_AMBULATORY_CARE_PROVIDER_SITE_OTHER): Payer: No Typology Code available for payment source | Admitting: Family Medicine

## 2016-04-19 ENCOUNTER — Ambulatory Visit (INDEPENDENT_AMBULATORY_CARE_PROVIDER_SITE_OTHER): Payer: No Typology Code available for payment source

## 2016-04-19 ENCOUNTER — Encounter: Payer: Self-pay | Admitting: Family Medicine

## 2016-04-19 VITALS — BP 125/69 | HR 68 | Temp 98.6°F | Wt 195.0 lb

## 2016-04-19 DIAGNOSIS — R05 Cough: Secondary | ICD-10-CM | POA: Diagnosis not present

## 2016-04-19 DIAGNOSIS — R053 Chronic cough: Secondary | ICD-10-CM | POA: Insufficient documentation

## 2016-04-19 MED ORDER — BENZONATATE 200 MG PO CAPS: 200.0000 mg | ORAL_CAPSULE | Freq: Three times a day (TID) | ORAL | Status: DC | PRN

## 2016-04-19 MED ORDER — IPRATROPIUM BROMIDE 0.06 % NA SOLN: 2.0000 | NASAL | Status: AC | PRN

## 2016-04-19 MED ORDER — FLUTICASONE PROPIONATE 50 MCG/ACT NA SUSP: 2.0000 | Freq: Every day | NASAL | Status: AC

## 2016-04-19 MED ORDER — PREDNISONE 10 MG PO TABS: 30.0000 mg | ORAL_TABLET | Freq: Every day | ORAL | Status: DC

## 2016-04-19 MED ORDER — OMEPRAZOLE 40 MG PO CPDR: 40.0000 mg | DELAYED_RELEASE_CAPSULE | Freq: Every day | ORAL | Status: DC

## 2016-04-19 NOTE — Progress Notes (Signed)
Brandon [name] is a 65 y.o. male who presents to [place]: [place] today for cough. Patient notes a one-month history of chronic persistent cough. He denies any wheezing or significant shortness of breath. He notes acid reflux and postnasal drainage. He's tried some over-the-counter medicines which have not helped. He uses Symbicort regularly as well as Zyrtec. He's tried albuterol which did not help much. He used a nasal spray for a few days which also didn't help much. He takes ranitidine twice daily for acid reflux which does not help. No fevers or chills nausea vomiting or diarrhea.   Past Medical History  Diagnosis Date  . Hepatitis C     [phone], treated at [facility]  . Nose fracture   . Hypertension   . GERD (gastroesophageal reflux disease)   . ED (erectile dysfunction)   . Hyperlipidemia    Past Surgical History  Procedure Laterality Date  . Foot surgery Right     1980  . Foot surgery Left     1980  . Inguinal hernia repair Right     2009   Social History  Substance Use Topics  . Smoking status: Former Smoker    Types: Cigarettes    Quit date: 10/22/1996  . Smokeless tobacco: Never Used  . Alcohol Use: No   family history includes Breast cancer in his sister; Depression in his sister; Ovarian cancer in his sister.  ROS as above:  Medications: Current Outpatient Prescriptions  Medication Sig Dispense Refill  . acyclovir (ZOVIRAX) 800 MG tablet     . albuterol (PROAIR HFA) 108 (90 BASE) MCG/ACT inhaler Inhale 2 puffs into the lungs every 6 (six) hours as needed for wheezing or shortness of breath. 1 Inhaler 1  . amLODipine (NORVASC) 5 MG tablet TAKE 1 TABLET BY MOUTH ONCE DAILY 90 tablet 1  . budesonide-formoterol (SYMBICORT) 80-4.5 MCG/ACT inhaler Inhale 2 puffs into the lungs 2 (two) times daily.    . clindamycin (CLEOCIN T) 1 % lotion Apply  topically 2 (two) times daily.    . clindamycin (CLEOCIN T) 1 % SWAB     . clotrimazole-betamethasone (LOTRISONE) cream     . flunisolide (NASALIDE) 25 MCG/ACT (0.025%) SOLN Place 2 sprays into the nose as needed.     [name] losartan-hydrochlorothiazide (HYZAAR) 100-25 MG tablet TAKE 1 TABLET BY MOUTH DAILY. 90 tablet 1  . montelukast (SINGULAIR) 10 MG tablet Take 1 tablet (10 mg total) by mouth at bedtime. 30 tablet 3  . sertraline (ZOLOFT) 50 MG tablet Take 50 mg by mouth daily.    . sildenafil (VIAGRA) 100 MG tablet Take 100 mg by mouth daily as needed for erectile dysfunction.    . tamsulosin (FLOMAX) 0.4 MG CAPS capsule Take 0.4 mg by mouth.    . terbinafine (LAMISIL) 1 % cream Apply 1 application topically 2 (two) times daily.    [name] terbinafine (LAMISIL) 250 MG tablet     . traZODone (DESYREL) 50 MG tablet Take 50 mg by mouth at bedtime.    . benzonatate (TESSALON) 200 MG capsule Take 1 capsule (200 mg total) by mouth 3 (three) times daily as needed for cough. 45 capsule 0  . fluticasone (FLONASE) 50 MCG/ACT nasal spray Place 2 sprays into both nostrils daily. 16 g 2  . hydrocortisone (ANUSOL-HC) 25 MG suppository Place 25 mg rectally as needed. Reported on 04/19/2016    . hydrocortisone 2.5 % cream Apply topically as needed. Reported on  04/19/2016    . ipratropium (ATROVENT) 0.06 % nasal spray Place 2 sprays into both nostrils every 4 (four) hours as needed for rhinitis. 10 mL 6  . omeprazole (PRILOSEC) 40 MG capsule Take 1 capsule (40 mg total) by mouth daily. 30 capsule 3  . predniSONE (DELTASONE) 10 MG tablet Take 3 tablets (30 mg total) by mouth daily with breakfast. 15 tablet 0   No current facility-administered medications for this visit.   No Known Allergies   Exam:  BP 125/69 mmHg  Pulse 68  Temp(Src) 98.6 F (37 C) (Oral)  Wt 195 lb (88.451 kg)  SpO2 97% Gen: Well NAD HEENT: EOMI,  MMM Posterior pharynx with cobblestoning Lungs: Normal work of breathing. CTABL frequent  coughing and throat clearing Heart: RRR no MRG Abd: NABS, Soft. Nondistended, Nontender Exts: Brisk capillary refill, warm and well perfused.   No results found for this or any previous visit (from the past 24 hour(s)). No results found.    Assessment and Plan: 65 y.o. male with chronic cough. Likely the trifecta of cough and asthma/bronchitis, acid reflux, and postnasal drainage. Plan to treat with Flonase and Atrovent nasal sprays for postnasal drainage, omeprazole for acid reflux, prednisone for asthma/bronchitis and Tessalon Perles for cough suppression. Obtain chest x-ray today. Follow-up with PCP if not better.  Discussed warning signs or symptoms. Please see discharge instructions. Patient expresses understanding.

## 2016-04-19 NOTE — Patient Instructions (Addendum)
Thank you for coming in today. Get xray today.  Start taking the medicines.  Return if not better.  Call or go to the emergency room if you get worse, have trouble breathing, have chest pains, or palpitations.   Cough, Adult Coughing is a reflex that clears your throat and your airways. Coughing helps to heal and protect your lungs. It is normal to cough occasionally, but a cough that happens with other symptoms or lasts a long time may be a sign of a condition that needs treatment. A cough may last only 2-3 weeks (acute), or it may last longer than 8 weeks (chronic). CAUSES Coughing is commonly caused by:  Breathing in substances that irritate your lungs.  A viral or bacterial respiratory infection.  Allergies.  Asthma.  Postnasal drip.  Smoking.  Acid backing up from the stomach into the esophagus (gastroesophageal reflux).  Certain medicines.  Chronic lung problems, including COPD (or rarely, lung cancer).  Other medical conditions such as heart failure. HOME CARE INSTRUCTIONS  Pay attention to any changes in your symptoms. Take these actions to help with your discomfort:  Take medicines only as told by your health care provider.  If you were prescribed an antibiotic medicine, take it as told by your health care provider. Do not stop taking the antibiotic even if you start to feel better.  Talk with your health care provider before you take a cough suppressant medicine.  Drink enough fluid to keep your urine clear or pale yellow.  If the air is dry, use a cold steam vaporizer or humidifier in your bedroom or your home to help loosen secretions.  Avoid anything that causes you to cough at work or at home.  If your cough is worse at night, try sleeping in a semi-upright position.  Avoid cigarette smoke. If you smoke, quit smoking. If you need help quitting, ask your health care provider.  Avoid caffeine.  Avoid alcohol.  Rest as needed. SEEK MEDICAL CARE IF:    You have new symptoms.  You cough up pus.  Your cough does not get better after 2-3 weeks, or your cough gets worse.  You cannot control your cough with suppressant medicines and you are losing sleep.  You develop pain that is getting worse or pain that is not controlled with pain medicines.  You have a fever.  You have unexplained weight loss.  You have night sweats. SEEK IMMEDIATE MEDICAL CARE IF:  You cough up blood.  You have difficulty breathing.  Your heartbeat is very fast.   This information is not intended to replace advice given to you by your health care provider. Make sure you discuss any questions you have with your health care provider.   Document Released: 04/06/2011 Document Revised: 06/29/2015 Document Reviewed: 12/15/2014 Elsevier Interactive Patient Education [employer].

## 2016-04-20 NOTE — Progress Notes (Signed)
Quick Note:  Bronchitis is present on x-ray. No pneumonia noted. ______

## 2016-05-31 ENCOUNTER — Other Ambulatory Visit: Payer: Self-pay | Admitting: Family Medicine

## 2016-06-18 ENCOUNTER — Ambulatory Visit (INDEPENDENT_AMBULATORY_CARE_PROVIDER_SITE_OTHER): Payer: Medicare Other | Admitting: Family Medicine

## 2016-06-18 ENCOUNTER — Encounter: Payer: Self-pay | Admitting: Family Medicine

## 2016-06-18 VITALS — BP 115/59 | HR 63 | Ht 67.6 in | Wt 197.0 lb

## 2016-06-18 DIAGNOSIS — M722 Plantar fascial fibromatosis: Secondary | ICD-10-CM

## 2016-06-18 DIAGNOSIS — K2100 Gastro-esophageal reflux disease with esophagitis, without bleeding: Secondary | ICD-10-CM

## 2016-06-18 DIAGNOSIS — I10 Essential (primary) hypertension: Secondary | ICD-10-CM | POA: Diagnosis not present

## 2016-06-18 DIAGNOSIS — Z13220 Encounter for screening for lipoid disorders: Secondary | ICD-10-CM

## 2016-06-18 DIAGNOSIS — Z1159 Encounter for screening for other viral diseases: Secondary | ICD-10-CM | POA: Diagnosis not present

## 2016-06-18 LAB — COMPLETE METABOLIC PANEL WITH GFR
ALT: 20 U/L (ref 9–46)
AST: 16 U/L (ref 10–35)
Albumin: 4.2 g/dL (ref 3.6–5.1)
Alkaline Phosphatase: 88 U/L (ref 40–115)
BUN: 14 mg/dL (ref 7–25)
CALCIUM: 9.4 mg/dL (ref 8.6–10.3)
CHLORIDE: 101 mmol/L (ref 98–110)
CO2: 25 mmol/L (ref 20–31)
Creat: 1.15 mg/dL (ref 0.70–1.25)
GFR, EST AFRICAN AMERICAN: 76 mL/min (ref >=60)
GFR, Est Non African American: 66 mL/min (ref >=60)
Glucose, Bld: 114 mg/dL — ABNORMAL HIGH (ref 65–99)
POTASSIUM: 4.1 mmol/L (ref 3.5–5.3)
Sodium: 138 mmol/L (ref 135–146)
Total Bilirubin: 0.4 mg/dL (ref 0.2–1.2)
Total Protein: 6.8 g/dL (ref 6.1–8.1)

## 2016-06-18 LAB — LIPID PANEL
CHOL/HDL RATIO: 5.9 ratio — AB (ref <=5.0)
CHOLESTEROL: 165 mg/dL (ref 125–200)
HDL: 28 mg/dL — ABNORMAL LOW (ref >=40)
LDL Cholesterol: 98 mg/dL (ref <130)
TRIGLYCERIDES: 197 mg/dL — AB (ref <150)
VLDL: 39 mg/dL — AB (ref <30)

## 2016-06-18 NOTE — Progress Notes (Signed)
Subjective:    CC: HTN, GERD  HPI: Hypertension- Pt denies chest pain, SOB, dizziness, or heart palpitations.  Taking meds as directed w/o problems.  Denies medication side effects.    PTSD - He is zoloft through the [place].    GERD - currently on omeprazole.  He was switched about a month ago off of ranitidine to omeprazole because he was still having breakthrough symptoms. He says he is much improved on the omeprazole and is very happy with this regimen.   He's been having a lot of pain near his heels and into his arch bilaterally. He says it's worse when he first gets up in the morning and puts pressure on his feet and then gets a little better. It's worse again by the end of the day and feels sore. He's also having a little bit of posterior heel pain. Is been wearing custom orthotics for years but is just getting a new pair  through the [place] and in fact will hopefully pick them up next week. They've doctor he saw there said he likely has plantar fasciitis.     Past medical history, Surgical history, Family history not pertinant except as noted below, Social history, Allergies, and medications have been entered into the medical record, reviewed, and corrections made.   Review of Systems: No fevers, chills, night sweats, weight loss, chest pain, or shortness of breath.   Objective:    General: Well Developed, well nourished, and in no acute distress.  Neuro: Alert and oriented x3, extra-ocular muscles intact, sensation grossly intact.  HEENT: Normocephalic, atraumatic  Skin: Warm and dry, no rashes. Cardiac: Regular rate and rhythm, no murmurs rubs or gallops, no lower extremity edema. No carotid bruits. Respiratory: Clear to auscultation bilaterally. Not using accessory muscles, speaking in full sentences.   Impression and Recommendations:    HTN - Well controlled. Continue current regimen. Follow up in  6 months.    PTSD - On Zoloft through the [place].  GERD - Symptoms well controlled  on PPI.  Planter fasciitis-given handout on stretches to do on his own at home as well as a band for stretches. If the stretches and the new orthotics are not helping over the next 3-4 weeks and encouraged him to come in to see one of our sports medicine doctors. He may benefit from a plantar fascial injection at that point in time.

## 2016-06-19 LAB — HEPATITIS C ANTIBODY: HCV Ab: REACTIVE — AB

## 2016-06-20 LAB — HEPATITIS C RNA QUANTITATIVE: HCV QUANT: NOT DETECTED [IU]/mL (ref <15)

## 2016-06-21 LAB — HEPATITIS C VRS RNA DETECT BY PCR-QUAL: Hepatitis C Vrs RNA by PCR-Qual: NOT DETECTED

## 2016-06-28 ENCOUNTER — Other Ambulatory Visit: Payer: Self-pay | Admitting: Family Medicine

## 2016-08-01 ENCOUNTER — Other Ambulatory Visit: Payer: Self-pay | Admitting: *Deleted

## 2016-08-01 MED ORDER — OMEPRAZOLE 40 MG PO CPDR: 40.0000 mg | DELAYED_RELEASE_CAPSULE | Freq: Every day | ORAL | 0 refills | Status: DC

## 2016-08-21 ENCOUNTER — Ambulatory Visit (INDEPENDENT_AMBULATORY_CARE_PROVIDER_SITE_OTHER): Payer: Medicare Other | Admitting: Family Medicine

## 2016-08-21 ENCOUNTER — Encounter: Payer: Self-pay | Admitting: Family Medicine

## 2016-08-21 VITALS — BP 121/62 | HR 72 | Wt 198.0 lb

## 2016-08-21 DIAGNOSIS — M19041 Primary osteoarthritis, right hand: Secondary | ICD-10-CM

## 2016-08-21 DIAGNOSIS — M19042 Primary osteoarthritis, left hand: Secondary | ICD-10-CM

## 2016-08-21 DIAGNOSIS — D171 Benign lipomatous neoplasm of skin and subcutaneous tissue of trunk: Secondary | ICD-10-CM

## 2016-08-21 NOTE — Progress Notes (Signed)
Subjective:    CC: knot on his right upper back.    HPI:  65 year male comes in today complaining of a knot on his back. His wife is here with him today. She says it started out pretty small but it's been slowly getting larger. He did have it looked at at the [facility] [place] while he was there. They recommended that he consult with a surgeon for possible removal. He just wanted me to take a look at it today to see what I thought about it. It doesn't really bother him or cause pain. And it's soft to touch.  He also complains of a lot of pain in his hands particularly in his distal thumb joints. The right is worse than the left. He is right-handed. He denies any known injury or trauma but did do a lot of heavy lifting and gripping for living when he worked for [employer]. Now that he's retired he doesn't do as much of that but is still having a lot of discomfort. He denies any swelling or redness of the joints.  Had flu shot done at the [place] 2 weeks ago.  There were no vitals taken for this visit.    No Known Allergies  Past Medical History:  Diagnosis Date  . ED (erectile dysfunction)   . GERD (gastroesophageal reflux disease)   . Hepatitis C    [id number], treated at [facility]  . Hyperlipidemia   . Hypertension   . Nose fracture     Past Surgical History:  Procedure Laterality Date  . FOOT SURGERY Right    1980  . FOOT SURGERY Left    1980  . INGUINAL HERNIA REPAIR Right    2009    Social History   Social History  . Marital status: Married    Spouse name: Nilo Kalscheur  . Number of children: 2  . Years of education: N/A   Occupational History  . [employer]     Dept of [employer] in [place] Topics  . Smoking status: Former Smoker    Types: Cigarettes    Quit date: 10/22/1996  . Smokeless tobacco: Never Used  . Alcohol use No  . Drug use: No  . Sexual activity: Yes    Partners: Female   Other Topics Concern  . Not on file   Social History Narrative    No caffeine.  Occ exercise. Has a 2 years associate degree. Previously worked at [employer] for 12 years. Also work to [employer].    Family History  Problem Relation Age of Onset  . Breast cancer Sister   . Depression Sister   . Hyperlipidemia    . Ovarian cancer Sister   . Hypertension      Outpatient Encounter Prescriptions as of 08/21/2016  Medication Sig  . acyclovir (ZOVIRAX) 800 MG tablet   . albuterol (PROAIR HFA) 108 (90 BASE) MCG/ACT inhaler Inhale 2 puffs into the lungs every 6 (six) hours as needed for wheezing or shortness of breath.  [name] amLODipine (NORVASC) 5 MG tablet Take 1 tablet (5 mg total) by mouth daily.  . budesonide-formoterol (SYMBICORT) 80-4.5 MCG/ACT inhaler Inhale 2 puffs into the lungs 2 (two) times daily.  . clindamycin (CLEOCIN T) 1 % lotion Apply topically 2 (two) times daily.  . clindamycin (CLEOCIN T) 1 % SWAB   . clotrimazole-betamethasone (LOTRISONE) cream   . fluticasone (FLONASE) 50 MCG/ACT nasal spray Place 2 sprays into both nostrils daily.  . hydrocortisone (ANUSOL-HC)  25 MG suppository Place 25 mg rectally as needed. Reported on 04/19/2016  . hydrocortisone 2.5 % cream Apply topically as needed. Reported on 04/19/2016  . ipratropium (ATROVENT) 0.06 % nasal spray Place 2 sprays into both nostrils every 4 (four) hours as needed for rhinitis.  [name] losartan-hydrochlorothiazide (HYZAAR) 100-25 MG tablet Take 1 tablet by mouth daily.  . montelukast (SINGULAIR) 10 MG tablet Take 1 tablet (10 mg total) by mouth at bedtime.  [name] omeprazole (PRILOSEC) 40 MG capsule Take 1 capsule (40 mg total) by mouth daily.  . sertraline (ZOLOFT) 50 MG tablet Take 50 mg by mouth daily.  . sildenafil (VIAGRA) 100 MG tablet Take 100 mg by mouth daily as needed for erectile dysfunction.  . tamsulosin (FLOMAX) 0.4 MG CAPS capsule Take 0.4 mg by mouth.  . terbinafine (LAMISIL) 1 % cream Apply 1 application topically 2 (two) times daily.  [name] terbinafine (LAMISIL) 250 MG tablet   . traZODone  (DESYREL) 50 MG tablet Take 50 mg by mouth at bedtime.   No facility-administered encounter medications on file as of 08/21/2016.        Objective:    General: Well Developed, well nourished, and in no acute distress.  Neuro: Alert and oriented x3, extra-ocular muscles intact, sensation grossly intact.  HEENT: Normocephalic, atraumatic  Skin: Warm and dry, no rashes. She has an approx 4.5- 5 cm round smooth soft lesion on mid right upper back between spine and shoulder blade.  Cardiac: Regular rate and rhythm, no murmurs rubs or gallops, no lower extremity edema.  Respiratory: Clear to auscultation bilaterally. Not using accessory muscles, speaking in full sentences. MSK: No swelling or redness of the joints.  Hands and fingers with NROM.  No significant deformities. Normal flexion and extension. No catching or locking. Mildly tender over the distal thumb joints bilaterally.   Impression and Recommendations:    Knot on upper back.  consistent with lipoma. Discussed diagnosis and benign condition. He is Artie had a consultation with the surgeon for removal but just want a second opinion to make sure that it sounded appropriate. Certainly is not urgent to have it removed but they can get larger so he may want to consider it. Next  Bilateral hand Osteoarthritis, particularly of the thumb joints. Discussed diagnosis. We could certainly consider getting x-rays and further evaluation if it gets worse but for now I do think it's most consistent with osteoarthritis and not some type of autoimmune disorder. Recommend Tylenol for maintain treatment and can use an NSAID occasionally but not on a regular basis.

## 2016-08-21 NOTE — Patient Instructions (Addendum)

## 2016-09-20 ENCOUNTER — Telehealth: Payer: Self-pay | Admitting: Family Medicine

## 2016-09-20 DIAGNOSIS — R3911 Hesitancy of micturition: Secondary | ICD-10-CM | POA: Diagnosis not present

## 2016-09-21 NOTE — Telephone Encounter (Signed)
error 

## 2016-11-15 ENCOUNTER — Other Ambulatory Visit: Payer: Self-pay | Admitting: Family Medicine

## 2016-12-13 ENCOUNTER — Telehealth: Payer: Self-pay | Admitting: Family Medicine

## 2016-12-13 NOTE — Telephone Encounter (Signed)
Call pt: I did his risk calculation for haert diseasse. Based on his age and numbers his 10 year risk of heart disease is 17%.  ANything > 7% and the guideline recommend starting a cholesterol lowering drug at bedtime. I would like him to consider starting this medication.   Beatrice Lecher, MD

## 2016-12-14 MED ORDER — ATORVASTATIN CALCIUM 20 MG PO TABS: 20.0000 mg | ORAL_TABLET | Freq: Every day | ORAL | 3 refills | Status: DC

## 2016-12-14 NOTE — Telephone Encounter (Signed)
Spoke with Pt, he is willing to try a cholesterol lowering medication. Routing to PCP for order. Would like Rx sent to [id number] - [place], [place] - [address] AT [place].

## 2016-12-14 NOTE — Telephone Encounter (Signed)
rx sent

## 2017-01-28 ENCOUNTER — Other Ambulatory Visit: Payer: Self-pay | Admitting: Family Medicine

## 2017-01-31 ENCOUNTER — Ambulatory Visit (INDEPENDENT_AMBULATORY_CARE_PROVIDER_SITE_OTHER): Payer: Medicare Other | Admitting: Family Medicine

## 2017-01-31 ENCOUNTER — Encounter: Payer: Self-pay | Admitting: Family Medicine

## 2017-01-31 VITALS — BP 117/73 | HR 73 | Ht 67.6 in | Wt 203.0 lb

## 2017-01-31 DIAGNOSIS — F339 Major depressive disorder, recurrent, unspecified: Secondary | ICD-10-CM | POA: Diagnosis not present

## 2017-01-31 DIAGNOSIS — N289 Disorder of kidney and ureter, unspecified: Secondary | ICD-10-CM

## 2017-01-31 DIAGNOSIS — E785 Hyperlipidemia, unspecified: Secondary | ICD-10-CM | POA: Diagnosis not present

## 2017-01-31 DIAGNOSIS — I10 Essential (primary) hypertension: Secondary | ICD-10-CM | POA: Diagnosis not present

## 2017-01-31 DIAGNOSIS — H833X3 Noise effects on inner ear, bilateral: Secondary | ICD-10-CM

## 2017-01-31 DIAGNOSIS — R911 Solitary pulmonary nodule: Secondary | ICD-10-CM | POA: Insufficient documentation

## 2017-01-31 DIAGNOSIS — H9190 Unspecified hearing loss, unspecified ear: Secondary | ICD-10-CM | POA: Insufficient documentation

## 2017-01-31 NOTE — Progress Notes (Signed)
Subjective:    CC: HTN  HPI:  Hypertension- Pt denies chest pain, SOB, dizziness, or heart palpitations.  Taking meds as directed w/o problems.  Denies medication side effects.     He now has bilateral hearing aids for noise-induced hearing loss.  He also wanted to update me on his low back pain. He recently started physical therapy and actually had an MRI done. He actually following up with the physician at the [facility] on April 18.  Pulmonary nodules-he had a chest CT recently and brought in the letter that he was provided. Evidently the nodules have been stable for 6 months and they recommended repeat CT in one year.  Renal lesion on the left kidney-they have done a renal ultrasound and he will be following up on these results next week as well.  Hyperlipidemia-ultimately his goal is to get off of Lipitor. He says his really started exercising and eating more healthy and is trying to get off the 18 pounds that he has gained since retirement.  He is currently being treated for some mild depression and is on Zoloft and trazodone. He's doing well on his current regimen.  Past medical history, Surgical history, Family history not pertinant except as noted below, Social history, Allergies, and medications have been entered into the medical record, reviewed, and corrections made.   Review of Systems: No fevers, chills, night sweats, weight loss, chest pain, or shortness of breath.   Objective:    General: Well Developed, well nourished, and in no acute distress.  Neuro: Alert and oriented x3, extra-ocular muscles intact, sensation grossly intact.  HEENT: Normocephalic, atraumatic  Skin: Warm and dry, no rashes. Cardiac: Regular rate and rhythm, no murmurs rubs or gallops, no lower extremity edema.  Respiratory: Clear to auscultation bilaterally. Not using accessory muscles, speaking in full sentences.   Impression and Recommendations:    HTN - Well controlled. Continue current regimen.  Follow up in  6 months.    Pulmonary nodules-being followed at the [place]. Due for repeat CT of chest in one year.  Left renal lesion-followed at the [place]. It was exophytic.    Due for pneumonia vacine. Says that he had this done at the [place] in 2016 [place] and get a copy of recent vaccinations.  Hyperlipidemia-plan to recheck lipids in 6 months. If at goal considered stopping Lipitor.

## 2017-02-01 LAB — BASIC METABOLIC PANEL WITH GFR
BUN: 10 mg/dL (ref 7–25)
CALCIUM: 9.7 mg/dL (ref 8.6–10.3)
CHLORIDE: 101 mmol/L (ref 98–110)
CO2: 26 mmol/L (ref 20–31)
CREATININE: 1.27 mg/dL — AB (ref 0.70–1.25)
GFR, Est African American: 68 mL/min (ref >=60)
GFR, Est Non African American: 58 mL/min — ABNORMAL LOW (ref >=60)
Glucose, Bld: 122 mg/dL — ABNORMAL HIGH (ref 65–99)
Potassium: 3.7 mmol/L (ref 3.5–5.3)
Sodium: 138 mmol/L (ref 135–146)

## 2017-02-12 ENCOUNTER — Other Ambulatory Visit: Payer: Self-pay | Admitting: Family Medicine

## 2017-02-22 ENCOUNTER — Other Ambulatory Visit: Payer: Self-pay | Admitting: Family Medicine

## 2017-03-04 ENCOUNTER — Encounter (INDEPENDENT_AMBULATORY_CARE_PROVIDER_SITE_OTHER): Payer: Self-pay

## 2017-03-05 ENCOUNTER — Ambulatory Visit (INDEPENDENT_AMBULATORY_CARE_PROVIDER_SITE_OTHER): Payer: Medicare Other | Admitting: Family Medicine

## 2017-03-05 DIAGNOSIS — M5136 Other intervertebral disc degeneration, lumbar region: Secondary | ICD-10-CM

## 2017-03-05 NOTE — Progress Notes (Signed)
Subjective:     Patient ID: Brandon [name], male   DOB: September 26, 1951, 66 y.o.   MRN: [id number]  HPI  66 year old male comes in today to discuss his low back pain. He has been followed at the [place]. They eventually referred him out to a neurosurgeon for possible surgical treatment of his degenerative disc of the lumbar spine with radiculopathy. The surgeon did feel like he would be a candidate for surgical repair. He would prefer to actually be seen by Dr. Elisabeth Cara. He said he actually saw him several years ago and because of her amputation would prefer to have surgery done by him he is requesting a new referral.  Review of Systems     Objective:   Physical Exam  Constitutional: He is oriented to person, place, and time. He appears well-developed and well-nourished.  HENT:  Head: Normocephalic and atraumatic.  Eyes: Conjunctivae and EOM are normal.  Cardiovascular: Normal rate.   Pulmonary/Chest: Effort normal.  Neurological: He is alert and oriented to person, place, and time.  Skin: Skin is dry. No pallor.  Psychiatric: He has a normal mood and affect. His behavior is normal.  Vitals reviewed.      Assessment:     Degenerative disc disease of lumbar spine, particularly L2 4 and L4-5.    Plan:     Place new Referral to Dr. Elisabeth Cara at [facility].  He has copy of his MRI with him. Encouraged him to take that the office visit with him.

## 2017-03-21 DIAGNOSIS — N138 Other obstructive and reflux uropathy: Secondary | ICD-10-CM | POA: Diagnosis not present

## 2017-03-21 DIAGNOSIS — N401 Enlarged prostate with lower urinary tract symptoms: Secondary | ICD-10-CM | POA: Diagnosis not present

## 2017-03-21 DIAGNOSIS — R3911 Hesitancy of micturition: Secondary | ICD-10-CM | POA: Diagnosis not present

## 2017-04-14 ENCOUNTER — Encounter: Payer: Self-pay | Admitting: Family Medicine

## 2017-05-16 ENCOUNTER — Ambulatory Visit (INDEPENDENT_AMBULATORY_CARE_PROVIDER_SITE_OTHER): Payer: Medicare Other | Admitting: Family Medicine

## 2017-05-16 ENCOUNTER — Ambulatory Visit: Payer: No Typology Code available for payment source | Admitting: Physician Assistant

## 2017-05-16 ENCOUNTER — Encounter: Payer: Self-pay | Admitting: Family Medicine

## 2017-05-16 VITALS — BP 120/59 | HR 72 | Ht 67.0 in | Wt 205.0 lb

## 2017-05-16 DIAGNOSIS — G4733 Obstructive sleep apnea (adult) (pediatric): Secondary | ICD-10-CM

## 2017-05-16 DIAGNOSIS — B356 Tinea cruris: Secondary | ICD-10-CM

## 2017-05-16 DIAGNOSIS — S022XXA Fracture of nasal bones, initial encounter for closed fracture: Secondary | ICD-10-CM | POA: Diagnosis not present

## 2017-05-16 DIAGNOSIS — Z9989 Dependence on other enabling machines and devices: Secondary | ICD-10-CM

## 2017-05-16 MED ORDER — CLOTRIMAZOLE-BETAMETHASONE 1-0.05 % EX CREA: TOPICAL_CREAM | Freq: Two times a day (BID) | CUTANEOUS | 3 refills | Status: AC

## 2017-05-16 NOTE — Progress Notes (Signed)
   Subjective:    Patient ID: Brandon [name], male    DOB: 02/13/1951, 66 y.o.   MRN: [id number]  HPI 66 year old male comes in today to discuss a possible referral to ear nose and throat. He was playing football while in [employer], around 1977, and was actually kicked in the face. It fractured his nose at the time. He has an old deformity and some nasal obstruction, and deviated septum.[name] He also has recurrent sinusitis. He was recently diagnosed with obstructive sleep apnea. He has been wearing his CPAP consistently. He would like a consultation with ENT to discuss whether or not the sleep apnea could be related to the nasal fracture.  Tinea cruris-he says he is getting a fungal rash in the groin area with some pigmentation. He said previously he was given a cream called clotrimazole/betamethasone that was very helpful by his previous PCP. He would like a refill on that prescription.   Review of Systems     Objective:   Physical Exam  Constitutional: He is oriented to person, place, and time. He appears well-developed and well-nourished.  HENT:  Head: Normocephalic and atraumatic.  Eyes: Conjunctivae and EOM are normal.  Cardiovascular: Normal rate.   Pulmonary/Chest: Effort normal.  Neurological: He is alert and oriented to person, place, and time.  Skin: Skin is dry. No pallor.  Psychiatric: He has a normal mood and affect. His behavior is normal.  Vitals reviewed.         Assessment & Plan:  Nasal fractures/nasal obstruction/deviated septum-we'll refer to ENT.  Obstructive sleep apnea-unclear if related to the nasal injury or not. Patient would like an opinion on that so will refer to ENT.  Tinea cruris-cream filled. If not improving over the next 2-3 weeks and please let me know.

## 2017-05-18 ENCOUNTER — Other Ambulatory Visit: Payer: Self-pay | Admitting: Family Medicine

## 2017-06-04 DIAGNOSIS — J342 Deviated nasal septum: Secondary | ICD-10-CM | POA: Diagnosis not present

## 2017-06-04 DIAGNOSIS — J320 Chronic maxillary sinusitis: Secondary | ICD-10-CM | POA: Diagnosis not present

## 2017-06-04 DIAGNOSIS — G4730 Sleep apnea, unspecified: Secondary | ICD-10-CM | POA: Diagnosis not present

## 2017-06-14 DIAGNOSIS — M4716 Other spondylosis with myelopathy, lumbar region: Secondary | ICD-10-CM | POA: Insufficient documentation

## 2017-07-25 LAB — HM COLONOSCOPY

## 2017-08-02 ENCOUNTER — Encounter: Payer: Self-pay | Admitting: Family Medicine

## 2017-08-02 ENCOUNTER — Ambulatory Visit (INDEPENDENT_AMBULATORY_CARE_PROVIDER_SITE_OTHER): Payer: Medicare Other | Admitting: Family Medicine

## 2017-08-02 VITALS — BP 110/55 | HR 66 | Ht 67.0 in | Wt 203.0 lb

## 2017-08-02 DIAGNOSIS — I10 Essential (primary) hypertension: Secondary | ICD-10-CM | POA: Diagnosis not present

## 2017-08-02 DIAGNOSIS — E785 Hyperlipidemia, unspecified: Secondary | ICD-10-CM | POA: Diagnosis not present

## 2017-08-02 MED ORDER — AMLODIPINE BESYLATE 5 MG PO TABS: 5.0000 mg | ORAL_TABLET | Freq: Every day | ORAL | 3 refills | Status: DC

## 2017-08-02 MED ORDER — LOSARTAN POTASSIUM-HCTZ 100-25 MG PO TABS: 1.0000 | ORAL_TABLET | Freq: Every day | ORAL | 3 refills | Status: DC

## 2017-08-02 NOTE — Progress Notes (Signed)
Subjective:    CC: BP  HPI:  Hypertension- Pt denies chest pain, SOB, dizziness, or heart palpitations.  Taking meds as directed w/o problems.  Denies medication side effects.    He is currently being treated for some mild depression and is on Zoloft and trazodone. He's doing well on his current regimen.  Hyperlipidemia-6 month follow-up.  Discussed at last office visit that he really wanted to work on getting off the medication and send been working hard on diet and exercise.  He did have his colonoscopy done at the [place] last week and says that he will call me with the report once he gets it. He says they also did an EGD because of his reflux.    Objective:    General: Well Developed, well nourished, and in no acute distress.  Neuro: Alert and oriented x3, extra-ocular muscles intact, sensation grossly intact.  HEENT: Normocephalic, atraumatic  Skin: Warm and dry, no rashes. Cardiac: Regular rate and rhythm, no murmurs rubs or gallops, no lower extremity edema.  Respiratory: Clear to auscultation bilaterally. Not using accessory muscles, speaking in full sentences.   Impression and Recommendations:    HTN - Well controlled. Continue current regimen. Follow up in  6 months. Refills sent to the pharmacy.  Hyperlipidemia-due to recheck lipids.  We'll have him complete a form to get records for his colonoscopy that was done.  OSA  - wears his CPAP nightly.   He will get the flu shot at the [place].

## 2017-08-04 LAB — HM COLONOSCOPY

## 2017-08-05 DIAGNOSIS — E785 Hyperlipidemia, unspecified: Secondary | ICD-10-CM | POA: Diagnosis not present

## 2017-08-05 DIAGNOSIS — I10 Essential (primary) hypertension: Secondary | ICD-10-CM | POA: Diagnosis not present

## 2017-08-05 LAB — COMPLETE METABOLIC PANEL WITH GFR
AG RATIO: 1.8 (calc) (ref 1.0–2.5)
ALBUMIN MSPROF: 4.4 g/dL (ref 3.6–5.1)
ALKALINE PHOSPHATASE (APISO): 102 U/L (ref 40–115)
ALT: 21 U/L (ref 9–46)
AST: 18 U/L (ref 10–35)
BILIRUBIN TOTAL: 0.4 mg/dL (ref 0.2–1.2)
BUN / CREAT RATIO: 10 (calc) (ref 6–22)
BUN: 13 mg/dL (ref 7–25)
CHLORIDE: 102 mmol/L (ref 98–110)
CO2: 29 mmol/L (ref 20–32)
Calcium: 9.3 mg/dL (ref 8.6–10.3)
Creat: 1.28 mg/dL — ABNORMAL HIGH (ref 0.70–1.25)
GFR, EST AFRICAN AMERICAN: 67 mL/min/{1.73_m2} (ref >=60)
GFR, Est Non African American: 58 mL/min/{1.73_m2} — ABNORMAL LOW (ref >=60)
Globulin: 2.4 g/dL (calc) (ref 1.9–3.7)
Glucose, Bld: 126 mg/dL — ABNORMAL HIGH (ref 65–99)
POTASSIUM: 3.9 mmol/L (ref 3.5–5.3)
SODIUM: 139 mmol/L (ref 135–146)
TOTAL PROTEIN: 6.8 g/dL (ref 6.1–8.1)

## 2017-08-05 LAB — LIPID PANEL W/REFLEX DIRECT LDL
CHOL/HDL RATIO: 4.2 (calc) (ref <5.0)
Cholesterol: 123 mg/dL (ref <200)
HDL: 29 mg/dL — ABNORMAL LOW (ref >40)
LDL Cholesterol (Calc): 73 mg/dL (calc)
NON-HDL CHOLESTEROL (CALC): 94 mg/dL (ref <130)
Triglycerides: 129 mg/dL (ref <150)

## 2017-08-24 ENCOUNTER — Other Ambulatory Visit: Payer: Self-pay | Admitting: Family Medicine

## 2017-09-03 ENCOUNTER — Encounter: Payer: Self-pay | Admitting: Family Medicine

## 2017-09-03 DIAGNOSIS — K200 Eosinophilic esophagitis: Secondary | ICD-10-CM | POA: Insufficient documentation

## 2017-10-07 ENCOUNTER — Encounter: Payer: Self-pay | Admitting: *Deleted

## 2017-10-09 ENCOUNTER — Ambulatory Visit: Payer: No Typology Code available for payment source | Admitting: Osteopathic Medicine

## 2017-10-09 DIAGNOSIS — Z0189 Encounter for other specified special examinations: Secondary | ICD-10-CM

## 2017-10-31 ENCOUNTER — Encounter: Payer: Self-pay | Admitting: Family Medicine

## 2017-11-04 ENCOUNTER — Encounter: Payer: Self-pay | Admitting: Family Medicine

## 2017-11-21 ENCOUNTER — Encounter: Payer: Self-pay | Admitting: Physician Assistant

## 2017-11-21 ENCOUNTER — Ambulatory Visit (INDEPENDENT_AMBULATORY_CARE_PROVIDER_SITE_OTHER): Payer: Medicare Other | Admitting: Physician Assistant

## 2017-11-21 VITALS — BP 125/86 | HR 70 | Temp 98.0°F | Wt 202.0 lb

## 2017-11-21 DIAGNOSIS — J0191 Acute recurrent sinusitis, unspecified: Secondary | ICD-10-CM | POA: Diagnosis not present

## 2017-11-21 DIAGNOSIS — A6000 Herpesviral infection of urogenital system, unspecified: Secondary | ICD-10-CM

## 2017-11-21 DIAGNOSIS — G43101 Migraine with aura, not intractable, with status migrainosus: Secondary | ICD-10-CM | POA: Diagnosis not present

## 2017-11-21 MED ORDER — DEXAMETHASONE SODIUM PHOSPHATE 4 MG/ML IJ SOLN
4.0000 mg | Freq: Once | INTRAMUSCULAR | Status: AC
  Administered 2017-11-21: 4 mg via INTRAMUSCULAR

## 2017-11-21 MED ORDER — VERAPAMIL HCL ER 180 MG PO TBCR: 180.0000 mg | EXTENDED_RELEASE_TABLET | Freq: Every day | ORAL | 3 refills | Status: DC

## 2017-11-21 MED ORDER — CEFDINIR 300 MG PO CAPS: 300.0000 mg | ORAL_CAPSULE | Freq: Two times a day (BID) | ORAL | 0 refills | Status: AC

## 2017-11-21 MED ORDER — BACLOFEN 10 MG PO TABS: 10.0000 mg | ORAL_TABLET | Freq: Three times a day (TID) | ORAL | 0 refills | Status: DC | PRN

## 2017-11-21 MED ORDER — VALACYCLOVIR HCL 500 MG PO TABS: 500.0000 mg | ORAL_TABLET | Freq: Every day | ORAL | 5 refills | Status: DC

## 2017-11-21 NOTE — Patient Instructions (Addendum)
For your migraines: - Stop Tylenol and all over-the-counter pain relievers. This will prevent something called "medication overuse headache" - Stop one of your blood pressure medications called Amlodipine - Start a new blood pressure medicine that will also help prevent migraines, called Verapamil  - At the first sign of headache, take Baclofen (may cause drowsiness) - Keep a headache diary - Follow-up with Dr. Madilyn Fireman in 1 month  For your sinus infection: - take antibiotic as prescribed for full 10 days - continue your NEtty Pot - get a cool mist humidifier for your bedroom - Ayr nasal saline gel for nosebleeds  For your HSV outbreaks: - valacyclovir 1 tablet daily    Recurrent Migraine Headache A migraine headache is very bad, throbbing pain that is usually on one side of your head. Recurrent migraines keep coming back (recurring). Talk with your doctor about what things may bring on (trigger) your migraine headaches. Follow these instructions at home: Medicines  Take over-the-counter and prescription medicines only as told by your doctor.  Do not drive or use heavy machinery while taking prescription pain medicine. Lifestyle  Do not use any products that contain nicotine or tobacco, such as cigarettes and e-cigarettes. If you need help quitting, ask your doctor.  Limit alcohol intake to no more than 1 drink a day for nonpregnant women and 2 drinks a day for men. One drink equals 12 oz of beer, 5 oz of wine, or 1 oz of hard liquor.  Get 7-9 hours of sleep each night.  Lessen any stress in your life. Ask your doctor about ways to lower your stress.  Stay at a healthy weight. Talk with your doctor if you need help losing weight.  Get regular exercise. General instructions  Keep a journal to find out if certain things bring on migraine headaches. For example, write down: ? What you eat and drink. ? How much sleep you get. ? Any change to your diet or medicines.  Lie  down in a dark, quiet room when you have a migraine.  Try placing a cool towel over your head when you have a migraine.  Keep lights dim if bright lights bother you or make your migraines worse.  Keep all follow-up visits as told by your doctor. This is important. Contact a doctor if:  Medicine does not help your migraines.  Your pain keeps coming back.  You have a fever.  You have weight loss without trying. Get help right away if:  Your migraine becomes really bad and medicine does not help.  You have a stiff neck.  You have trouble seeing.  Your muscles are weak or you lose control of your muscles.  You lose your balance or have trouble walking.  You feel like you will pass out (faint) or you pass out.  You have really bad symptoms that are different than your first symptoms.  You start having sudden, very bad headaches that last for one second or less, like a thunderclap. Summary  A migraine headache is very bad, throbbing pain that is usually on one side of your head.  Talk with your doctor about what things may bring on (trigger) your migraine headaches.  Take over-the-counter and prescription medicines only as told by your doctor.  Lie down in a dark, quiet room when you have a migraine.  Keep a journal about what you eat and drink, how much sleep you get, and any changes to your medicines. This can help you find out if  certain things make you have migraine headaches. This information is not intended to replace advice given to you by your health care provider. Make sure you discuss any questions you have with your health care provider. Document Released: 07/17/2008 Document Revised: 08/31/2016 Document Reviewed: 08/31/2016 Elsevier Interactive Patient Education  2017 [employer].

## 2017-11-21 NOTE — Progress Notes (Signed)
HPI:                                                                Brandon [name] is a 67 y.o. male who presents to [place]: [place] today for sinus problem and headache  Sinus Problem  This is a recurrent problem. The current episode started more than 1 month ago. The problem is unchanged. Associated symptoms include congestion, headaches and sinus pressure. Pertinent negatives include no chills. Past treatments include spray decongestants and acetaminophen (netty pot).   Migraines Patient also reports recurrent headaches since 1976. States he was diagnosed with migraines by his PCP in the late 1980's. He is not sure if he has ever had advanced head imaging. HE has never been on preventive medication or a triptan for his migraines. He states he "got a shot once." He denies family history of migraines. He states that for the last 3 months he has had a headache almost daily. Reports left-sided, debilitating headaches associated with photophobia and nausea. He also reports an aura of "little black dots" that usually precedes his migraine and lasts less than an hour. Denies diplopia, focal weakness, gait disturbance, seizure or LOC. He has been taking Tylenol daily for the last 2-3 months.   Recurrent HSV Also reports 7 herpes genitalis outbreaks per year despite daily Acyclovir. He has been taking 1/2 of a tablet.  Depression screen [facility] 2/9 08/02/2017 01/31/2017  Decreased Interest 1 2  Down, Depressed, Hopeless 1 1  PHQ - 2 Score 2 3  Altered sleeping 1 1  Tired, decreased energy 1 1  Change in appetite 2 1  Feeling bad or failure about yourself  1 1  Trouble concentrating 1 1  Moving slowly or fidgety/restless 1 1  Suicidal thoughts 1 1  PHQ-9 Score 10 10    No flowsheet data found.    Past Medical History:  Diagnosis Date  . ED (erectile dysfunction)   . GERD (gastroesophageal reflux disease)   . Hepatitis C    [phone],  treated at [facility]  . Hyperlipidemia   . Hypertension   . Nose fracture    Past Surgical History:  Procedure Laterality Date  . FOOT SURGERY Right    1980  . FOOT SURGERY Left    1980  . INGUINAL HERNIA REPAIR Right    2009   Social History   Tobacco Use  . Smoking status: Former Smoker    Types: Cigarettes    Last attempt to quit: 10/22/1996    Years since quitting: 21.0  . Smokeless tobacco: Never Used  Substance Use Topics  . Alcohol use: No   family history includes Breast cancer in his sister; Depression in his sister; Hyperlipidemia in his unknown relative; Hypertension in his unknown relative; Ovarian cancer in his sister.    ROS: negative except as noted in the HPI  Medications: Current Outpatient Medications  Medication Sig Dispense Refill  . albuterol (PROAIR HFA) 108 (90 BASE) MCG/ACT inhaler Inhale 2 puffs into the lungs every 6 (six) hours as needed for wheezing or shortness of breath. 1 Inhaler 1  . atorvastatin (LIPITOR) 20 MG tablet Take 1 tablet (20 mg total) by mouth daily. 90 tablet 3  . baclofen (LIORESAL) 10 MG  tablet Take 1 tablet (10 mg total) by mouth every 8 (eight) hours as needed (migraine (may cause drowsiness)). 30 each 0  . budesonide-formoterol (SYMBICORT) 80-4.5 MCG/ACT inhaler Inhale 2 puffs into the lungs 2 (two) times daily.    . cefdinir (OMNICEF) 300 MG capsule Take 1 capsule (300 mg total) by mouth 2 (two) times daily for 10 days. 20 capsule 0  . clindamycin (CLEOCIN T) 1 % lotion Apply topically 2 (two) times daily.    . clindamycin (CLEOCIN T) 1 % SWAB     . clotrimazole-betamethasone (LOTRISONE) cream Apply topically 2 (two) times daily. 45 g 3  . fluticasone (FLONASE) 50 MCG/ACT nasal spray Place 2 sprays into both nostrils daily. 16 g 2  . hydrocortisone (ANUSOL-HC) 25 MG suppository Place 25 mg rectally as needed. Reported on 04/19/2016    . hydrocortisone 2.5 % cream Apply topically as needed. Reported on 04/19/2016    . ipratropium  (ATROVENT) 0.06 % nasal spray Place 2 sprays into both nostrils every 4 (four) hours as needed for rhinitis. 10 mL 6  . losartan-hydrochlorothiazide (HYZAAR) 100-25 MG tablet Take 1 tablet by mouth daily. 90 tablet 3  . montelukast (SINGULAIR) 10 MG tablet TAKE 1 TABLET BY MOUTH AT BEDTIME. 30 tablet 3  . omeprazole (PRILOSEC) 40 MG capsule TAKE 1 CAPSULE BY MOUTH EVERY DAY 90 capsule 0  . sertraline (ZOLOFT) 100 MG tablet Take 100 mg by mouth daily.     . sildenafil (VIAGRA) 100 MG tablet Take 100 mg by mouth daily as needed for erectile dysfunction.    . tamsulosin (FLOMAX) 0.4 MG CAPS capsule Take 0.4 mg by mouth.    . terbinafine (LAMISIL) 1 % cream Apply 1 application topically 2 (two) times daily.    . traZODone (DESYREL) 50 MG tablet Take 50 mg by mouth at bedtime.    . valACYclovir (VALTREX) 500 MG tablet Take 1 tablet (500 mg total) by mouth daily. 30 tablet 5  . verapamil (CALAN-SR) 180 MG CR tablet Take 1 tablet (180 mg total) by mouth daily. 30 tablet 3   No current facility-administered medications for this visit.    No Known Allergies     Objective:  BP 125/86   Pulse 70   Temp 98 F (36.7 C) (Oral)   Wt 202 lb (91.6 kg)   BMI 31.64 kg/m  Gen:  alert, not ill-appearing, no distress, appropriate for age, obese male HEENT: head normocephalic without obvious abnormality, conjunctiva and cornea clear, TM's clear bilaterally, wearing hearing aids, nasal mucosa edematous with rhinorrhea, there is left maxillary sinus tenderness, oropharynx clear, moist mucous membranes, neck supple, no adenopathy, trachea midline Pulm: Normal work of breathing, normal phonation CV: Normal rate, regular rhythm, s1 and s2 distinct, no murmurs, clicks or rubs  Neuro: alert and oriented x 3, no tremor MSK: extremities atraumatic, normal gait and station Skin: intact, no rashes on exposed skin, no jaundice, no cyanosis Psych: well-groomed, cooperative, good eye contact, euthymic mood, affect  mood-congruent, speech is articulate, and thought processes clear and goal-directed  No results found for this or any previous visit (from the past 72 hour(s)). No results found.    Assessment and Plan: 67 y.o. male with   1. Acute recurrent sinusitis, unspecified location - netty pot, nasal saline gel, cool mist humidifier - cefdinir (OMNICEF) 300 MG capsule; Take 1 capsule (300 mg total) by mouth 2 (two) times daily for 10 days.  Dispense: 20 capsule; Refill: 0  2. Migraine with aura  and with status migrainosus, not intractable - long-standing history of unilateral headaches with photophobia, nausea and aura that have unfortunately been going untreated. This is complicated by likely medication overuse headache from Acetaminophen. No red flag symptoms. No thunderclap headache - decadron given in office today - starting prophylaxis with Verapamil. Patient was already taking Amlodipine. He was instructed to stop his Amlodipine and continue his other BP meds. - given age and CVD risk factors, I am going to avoid triptans for now and try baclofen for abortive therapy. I don't think triptans are a true contraindication but I would defer to PCP or neuro - instructed to stop Tylenol and all OTC pain relievers - headache diary - follow-up with PCP in 4 weeks - verapamil (CALAN-SR) 180 MG CR tablet; Take 1 tablet (180 mg total) by mouth daily.  Dispense: 30 tablet; Refill: 3 - baclofen (LIORESAL) 10 MG tablet; Take 1 tablet (10 mg total) by mouth every 8 (eight) hours as needed (migraine (may cause drowsiness)).  Dispense: 30 each; Refill: 0 - dexamethasone (DECADRON) injection 4 mg  3. Recurrent genital herpes simplex - valACYclovir (VALTREX) 500 MG tablet; Take 1 tablet (500 mg total) by mouth daily.  Dispense: 30 tablet; Refill: 5   Patient education and anticipatory guidance given Patient agrees with treatment plan Follow-up in 4 weeks with PCP or sooner as needed if symptoms worsen or  fail to improve  I spent 40 minutes with this patient, greater than 50% was face-to-face time counseling regarding the above diagnoses   Darlyne Russian PA-C

## 2017-11-26 ENCOUNTER — Other Ambulatory Visit: Payer: Self-pay

## 2017-11-26 MED ORDER — OMEPRAZOLE 40 MG PO CPDR: DELAYED_RELEASE_CAPSULE | ORAL | 3 refills | Status: DC

## 2017-11-26 MED ORDER — ATORVASTATIN CALCIUM 20 MG PO TABS: 20.0000 mg | ORAL_TABLET | Freq: Every day | ORAL | 3 refills | Status: DC

## 2017-11-28 ENCOUNTER — Encounter: Payer: Self-pay | Admitting: Family Medicine

## 2017-11-28 ENCOUNTER — Ambulatory Visit (INDEPENDENT_AMBULATORY_CARE_PROVIDER_SITE_OTHER): Payer: Medicare Other | Admitting: Family Medicine

## 2017-11-28 VITALS — BP 133/65 | HR 69 | Ht 67.0 in | Wt 200.0 lb

## 2017-11-28 DIAGNOSIS — I10 Essential (primary) hypertension: Secondary | ICD-10-CM | POA: Diagnosis not present

## 2017-11-28 DIAGNOSIS — J0121 Acute recurrent ethmoidal sinusitis: Secondary | ICD-10-CM | POA: Diagnosis not present

## 2017-11-28 DIAGNOSIS — Z9989 Dependence on other enabling machines and devices: Secondary | ICD-10-CM

## 2017-11-28 DIAGNOSIS — G43701 Chronic migraine without aura, not intractable, with status migrainosus: Secondary | ICD-10-CM | POA: Diagnosis not present

## 2017-11-28 DIAGNOSIS — G4733 Obstructive sleep apnea (adult) (pediatric): Secondary | ICD-10-CM

## 2017-11-28 MED ORDER — TOPIRAMATE 25 MG PO TABS: ORAL_TABLET | ORAL | 0 refills | Status: DC

## 2017-11-28 NOTE — Progress Notes (Signed)
Subjective:    Patient ID: Brandon [name], male    DOB: [birth year], 67 y.o.   MRN: [id number]  HPI  HTN - he was seen on 1/31 for migraines headaches and [place], [place] changed him from amlodipine and losartan to verapamil.  Tried the verapamil for 2 days and felt really fatigued and was urinating more He decided to stop the verapamil and has restarted the losartan but not the amlodipine 5mg .    Migraine headaches-he did bring in his headache log.  He says he is just not sure if his sinus pressure triggers the headaches or at the headaches are triggering the sinus pressure and pain.  He has been taking his anabolic for sinusitis and is feeling a little bit better but he has unfortunately had daily headaches ever since then.  He does get relief with Tylenol and with baclofen.   OSA -he says his wife reports that he is been breath-holding and even stacked breathing sometimes at night.  Even with using his CPAP which he has now been on since last February.  He currently gets his supplies etc. through the [place].  Review of Systems  BP 133/65   Pulse 69   Ht 5\' 7"  (1.702 m)   Wt 200 lb (90.7 kg)   SpO2 100%   BMI 31.32 kg/m     No Known Allergies  Past Medical History:  Diagnosis Date  . ED (erectile dysfunction)   . GERD (gastroesophageal reflux disease)   . Hepatitis C    [phone], treated at [facility]  . Hyperlipidemia   . Hypertension   . Nose fracture     Past Surgical History:  Procedure Laterality Date  . FOOT SURGERY Right    1980  . FOOT SURGERY Left    1980  . INGUINAL HERNIA REPAIR Right    2009    Social History   Socioeconomic History  . Marital status: Married    Spouse name: Itzael Liptak  . Number of children: 2  . Years of education: Not on file  . Highest education level: Not on file  Social Needs  . Financial resource strain: Not on file  . Food insecurity - worry: Not on file  . Food insecurity - inability: Not on file  . Transportation needs -  medical: Not on file  . Transportation needs - non-medical: Not on file  Occupational History  . Occupation: Investment banker, corporate Rep    Comment: Dept of [employer] in [place]  . Smoking status: Former Smoker    Types: Cigarettes    Last attempt to quit: 10/22/1996    Years since quitting: 21.1  . Smokeless tobacco: Never Used  Substance and Sexual Activity  . Alcohol use: No  . Drug use: No  . Sexual activity: Yes    Partners: Female  Other Topics Concern  . Not on file  Social History Narrative   No caffeine.  Occ exercise. Has a 2 years associate degree. Previously worked at [employer] for 12 years. Also work to [employer].    Family History  Problem Relation Age of Onset  . Breast cancer Sister   . Depression Sister   . Hyperlipidemia Unknown   . Ovarian cancer Sister   . Hypertension Unknown     Outpatient Encounter Medications as of 11/28/2017  Medication Sig  . albuterol (PROAIR HFA) 108 (90 BASE) MCG/ACT inhaler Inhale 2 puffs into the lungs every 6 (six) hours as needed for wheezing or shortness  of breath.  [name] atorvastatin (LIPITOR) 20 MG tablet Take 1 tablet (20 mg total) by mouth daily.  . baclofen (LIORESAL) 10 MG tablet Take 1 tablet (10 mg total) by mouth every 8 (eight) hours as needed (migraine (may cause drowsiness)).  . budesonide-formoterol (SYMBICORT) 80-4.5 MCG/ACT inhaler Inhale 2 puffs into the lungs 2 (two) times daily.  . cefdinir (OMNICEF) 300 MG capsule Take 1 capsule (300 mg total) by mouth 2 (two) times daily for 10 days.  . clindamycin (CLEOCIN T) 1 % lotion Apply topically 2 (two) times daily.  . clindamycin (CLEOCIN T) 1 % SWAB   . clotrimazole-betamethasone (LOTRISONE) cream Apply topically 2 (two) times daily.  . fluticasone (FLONASE) 50 MCG/ACT nasal spray Place 2 sprays into both nostrils daily.  . hydrocortisone (ANUSOL-HC) 25 MG suppository Place 25 mg rectally as needed. Reported on 04/19/2016  . hydrocortisone 2.5 % cream Apply topically as  needed. Reported on 04/19/2016  . ipratropium (ATROVENT) 0.06 % nasal spray Place 2 sprays into both nostrils every 4 (four) hours as needed for rhinitis.  [name] losartan-hydrochlorothiazide (HYZAAR) 100-25 MG tablet Take 1 tablet by mouth daily.  . montelukast (SINGULAIR) 10 MG tablet TAKE 1 TABLET BY MOUTH AT BEDTIME.  [name] omeprazole (PRILOSEC) 40 MG capsule TAKE 1 CAPSULE BY MOUTH EVERY DAY  . sildenafil (VIAGRA) 100 MG tablet Take 100 mg by mouth daily as needed for erectile dysfunction.  . tadalafil (CIALIS) 5 MG tablet Take 5 mg by mouth daily.  . tamsulosin (FLOMAX) 0.4 MG CAPS capsule Take 0.4 mg by mouth.  . terbinafine (LAMISIL) 1 % cream Apply 1 application topically 2 (two) times daily.  . traZODone (DESYREL) 50 MG tablet Take 50 mg by mouth at bedtime.  . valACYclovir (VALTREX) 500 MG tablet Take 1 tablet (500 mg total) by mouth daily.  [name] topiramate (TOPAMAX) 25 MG tablet 25 mg p.o. nightly times 1 week.  Then increase to 25 mg twice a day.  . [DISCONTINUED] sertraline (ZOLOFT) 100 MG tablet Take 100 mg by mouth daily.   . [DISCONTINUED] verapamil (CALAN-SR) 180 MG CR tablet Take 1 tablet (180 mg total) by mouth daily.   No facility-administered encounter medications on file as of 11/28/2017.          Objective:   Physical Exam  Constitutional: He is oriented to person, place, and time. He appears well-developed and well-nourished.  HENT:  Head: Normocephalic and atraumatic.  Right Ear: External ear normal.  Left Ear: External ear normal.  Nose: Nose normal.  Mouth/Throat: Oropharynx is clear and moist.  TMs and canals are clear.   Eyes: Conjunctivae and EOM are normal. Pupils are equal, round, and reactive to light.  Neck: Neck supple. No thyromegaly present.  Cardiovascular: Normal rate and normal heart sounds.  Pulmonary/Chest: Effort normal and breath sounds normal.  Lymphadenopathy:    He has no cervical adenopathy.  Neurological: He is alert and oriented to person,  place, and time.  Skin: Skin is warm and dry.  Psychiatric: He has a normal mood and affect.          Assessment & Plan:  HTN -overall blood pressure looks good just on the losartan.  Normally he runs in the 314 systolic versus the 970Y so we may end up adding the amlodipine back but will hold off for now just keep an eye on it.  He does have a blood pressure cuff at home and says he will start checking it at home.  Migraine headaches-he  did bring in his log and unfortunately he has had a headache every day since he was seen.  Headache pain ranging anywhere from a 5 to up to a 10.  Tylenol and baclofen do give him some moderate relief.  He still feels like it starts always in his face with his sinus pressure.  Particularly on the left sinus.  We discussed options.  We will try topiramate we will see him back in 1 month.  Sinusitis-just make sure to complete antibiotic course.  OSA - I want to try to get the download to see if his pressure is therapeutic.  I am concerned that he is still holding his breath during sleep even with the CPAP.

## 2017-11-28 NOTE — Patient Instructions (Signed)
Keep up the diary.   Check BP 2-3 times a week for your blood pressure.

## 2017-12-20 ENCOUNTER — Ambulatory Visit: Payer: No Typology Code available for payment source | Admitting: Family Medicine

## 2017-12-23 ENCOUNTER — Other Ambulatory Visit: Payer: Self-pay | Admitting: *Deleted

## 2017-12-23 DIAGNOSIS — G43701 Chronic migraine without aura, not intractable, with status migrainosus: Secondary | ICD-10-CM

## 2017-12-23 MED ORDER — TOPIRAMATE 25 MG PO TABS: ORAL_TABLET | ORAL | 1 refills | Status: DC

## 2017-12-24 ENCOUNTER — Encounter: Payer: Self-pay | Admitting: Family Medicine

## 2017-12-26 ENCOUNTER — Ambulatory Visit: Payer: No Typology Code available for payment source | Admitting: Family Medicine

## 2018-01-02 ENCOUNTER — Ambulatory Visit (INDEPENDENT_AMBULATORY_CARE_PROVIDER_SITE_OTHER): Payer: Medicare Other | Admitting: Family Medicine

## 2018-01-02 ENCOUNTER — Encounter: Payer: Self-pay | Admitting: Family Medicine

## 2018-01-02 VITALS — BP 113/57 | HR 55 | Ht 67.0 in | Wt 194.0 lb

## 2018-01-02 DIAGNOSIS — J0191 Acute recurrent sinusitis, unspecified: Secondary | ICD-10-CM

## 2018-01-02 DIAGNOSIS — G43701 Chronic migraine without aura, not intractable, with status migrainosus: Secondary | ICD-10-CM

## 2018-01-02 NOTE — Patient Instructions (Signed)
Ok to stop the topiramate.

## 2018-01-02 NOTE — Progress Notes (Signed)
   Subjective:    Patient ID: Brandon [name], male    DOB: 07-12-1951, 67 y.o.   MRN: [id number]  HPI 67 year old male is here today to follow-up for migraine headaches.  When he was last here about 5 weeks ago we had discussed that he was having more frequent headaches.  He just was not sure if some of them were related to his sinuses or if his sinuses were triggering the headaches.  Has been taking an antibiotic for his sinuses and was actually feeling some better.,  But was still having daily headaches.  He was not getting significant relief with Tylenol or baclofen both of which normally help him.  We discussed starting him on topiramate and then seeing him back to see if this was helping to control the daily chronic headaches.  Encouraged him to complete his antibiotics as well.  He did bring in his log for his migraine headaches.  Unfortunately he continued to have daily headaches as well as fatigue congestion and even intermittent fevers up until about March 2.  Around that time he started feeling significantly better and the headache seemed to resolve.  Energy levels are almost back to normal and he is not had any more fevers.  He did continue to take the topiramate regularly.   Review of Systems     Objective:   Physical Exam  Constitutional: He is oriented to person, place, and time. He appears well-developed and well-nourished.  HENT:  Head: Normocephalic and atraumatic.  Mild swelling under the left eye and tenderness.   Eyes: Conjunctivae and EOM are normal.  Cardiovascular: Normal rate.  Pulmonary/Chest: Effort normal.  Neurological: He is alert and oriented to person, place, and time.  Skin: Skin is dry. No pallor.  Psychiatric: He has a normal mood and affect. His behavior is normal.  Vitals reviewed.        Assessment & Plan:  Migraine headaches- he is much better.  Really think this is probably just triggered by very severe sinus infection that lasted over a month.   He did complete all of his antibiotics but still continued to feel poorly for about 2 more weeks.  He has felt better in the last 11 days at this point I think we can safely discontinue the topiramate that we can always restart it if he starts to get more frequent headaches.    Left maxillary sinusitis-he still has a little bit of swelling under the left eye near the corner of the nose with some tenderness so that persist after another week or 2 and I would like to continue to consider getting a limited CT sinus film.

## 2018-01-03 ENCOUNTER — Telehealth: Payer: Self-pay | Admitting: Family Medicine

## 2018-01-03 NOTE — Telephone Encounter (Signed)
Pt called to see if PCP was able to get the download from his CPAP machine. He uses a machine through the [place], they use [place] has been the one to order and provide all CPAP supplies.   Pt reports he already signed medical release paperwork to have this sent to PCP. Will route.   [facility]'s [facility] Provider in [place] is Dr Sherral Hammers.

## 2018-01-03 NOTE — Telephone Encounter (Signed)
I haven't seen it.  Can you call them and ask them to fax ASAP?

## 2018-01-06 ENCOUNTER — Telehealth: Payer: Self-pay | Admitting: Family Medicine

## 2018-01-06 NOTE — Telephone Encounter (Signed)
Call pt: CPAP download shows he is actually doing really well  On it.  His AHI was 2.7 so at goal ( which is under 4).  He is currently set on Autopap. He may do better to change to a set pressure which is what most patients use.  ONce we know the average pressure on Autopap we usually switch people to a set pressure. Then the machine will ramp up to that pressure as he falls asleep.  Recommend pressure set to 15 cm water pressure.  He could try this and see which he prefers.  If we need to fas to [facility] a note we can.

## 2018-01-06 NOTE — Telephone Encounter (Signed)
Records requested

## 2018-01-07 NOTE — Telephone Encounter (Signed)
Pt advised of recommendations and he is fine with making the necessary changes as Dr. Madilyn Fireman sees needed.Brandon [name], [place]

## 2018-01-08 NOTE — Telephone Encounter (Signed)
Letter printed to be faxed to the [place]

## 2018-01-13 NOTE — Telephone Encounter (Signed)
Sent to [place] [facility] Attn CPAP.Brandon [name], Brandon [name]

## 2018-01-23 ENCOUNTER — Telehealth: Payer: Self-pay | Admitting: Family Medicine

## 2018-01-23 NOTE — Telephone Encounter (Signed)
Call pt: I reviiewed report on Xray of the hand. It does show some mild arthritis at the base of the thumb. The arthritis is not sever.  For pain we can try volteran gel if is is painful.  Or even consider injection with one of our sport med docs if getting worse or if VERY painful.

## 2018-01-24 MED ORDER — DICLOFENAC SODIUM 1 % TD GEL: 2.0000 g | Freq: Four times a day (QID) | TRANSDERMAL | 4 refills | Status: DC

## 2018-01-24 NOTE — Telephone Encounter (Signed)
Pt advised of results. He would like to try the volteran gel. He request this be sent to [id number] - [place],  - [address] AT [place].   Will route to Provider for order.

## 2018-01-24 NOTE — Telephone Encounter (Signed)
Medication ordered and sent to pharmacy.Brandon [name], [place]

## 2018-01-30 ENCOUNTER — Encounter: Payer: Self-pay | Admitting: Family Medicine

## 2018-01-30 ENCOUNTER — Ambulatory Visit (INDEPENDENT_AMBULATORY_CARE_PROVIDER_SITE_OTHER): Payer: Medicare Other | Admitting: Family Medicine

## 2018-01-30 VITALS — BP 133/59 | HR 62 | Ht 67.0 in | Wt 197.0 lb

## 2018-01-30 DIAGNOSIS — J301 Allergic rhinitis due to pollen: Secondary | ICD-10-CM

## 2018-01-30 DIAGNOSIS — E785 Hyperlipidemia, unspecified: Secondary | ICD-10-CM

## 2018-01-30 DIAGNOSIS — I10 Essential (primary) hypertension: Secondary | ICD-10-CM | POA: Diagnosis not present

## 2018-01-30 DIAGNOSIS — Z9989 Dependence on other enabling machines and devices: Secondary | ICD-10-CM | POA: Diagnosis not present

## 2018-01-30 DIAGNOSIS — M19041 Primary osteoarthritis, right hand: Secondary | ICD-10-CM | POA: Diagnosis not present

## 2018-01-30 DIAGNOSIS — G4733 Obstructive sleep apnea (adult) (pediatric): Secondary | ICD-10-CM | POA: Diagnosis not present

## 2018-01-30 LAB — LIPID PANEL
CHOL/HDL RATIO: 5.5 (calc) — AB (ref <5.0)
Cholesterol: 171 mg/dL (ref <200)
HDL: 31 mg/dL — AB (ref >40)
LDL Cholesterol (Calc): 113 mg/dL (calc) — ABNORMAL HIGH
NON-HDL CHOLESTEROL (CALC): 140 mg/dL — AB (ref <130)
Triglycerides: 155 mg/dL — ABNORMAL HIGH (ref <150)

## 2018-01-30 LAB — BASIC METABOLIC PANEL WITH GFR
BUN: 15 mg/dL (ref 7–25)
CALCIUM: 9.9 mg/dL (ref 8.6–10.3)
CHLORIDE: 102 mmol/L (ref 98–110)
CO2: 30 mmol/L (ref 20–32)
Creat: 1.11 mg/dL (ref 0.70–1.25)
GFR, EST AFRICAN AMERICAN: 79 mL/min/{1.73_m2} (ref >=60)
GFR, Est Non African American: 68 mL/min/{1.73_m2} (ref >=60)
Glucose, Bld: 107 mg/dL — ABNORMAL HIGH (ref 65–99)
POTASSIUM: 4.2 mmol/L (ref 3.5–5.3)
Sodium: 139 mmol/L (ref 135–146)

## 2018-01-30 MED ORDER — DICLOFENAC SODIUM 1 % TD GEL: 2.0000 g | Freq: Four times a day (QID) | TRANSDERMAL | 99 refills | Status: AC

## 2018-01-30 MED ORDER — ALBUTEROL SULFATE HFA 108 (90 BASE) MCG/ACT IN AERS: 2.0000 | INHALATION_SPRAY | Freq: Four times a day (QID) | RESPIRATORY_TRACT | 4 refills | Status: AC | PRN

## 2018-01-30 NOTE — Progress Notes (Signed)
Subjective:    CC: BP, sleep apnea  HPI:  Hypertension- Pt denies SOB, dizziness, or heart palpitations.  Taking meds as directed w/o problems.  Denies medication side effects.  He does get some occasional chest pain but really feels like it is from reflux that he does take his Prilosec daily.  Reviewed recent x-ray of his hand which showed some mild arthritis at the base of the thumb.  We recommended a trial of Voltaren gel.  Prescription was sent last week.  Sleep apnea-he was having some stacked breathing that his wife is noticing.  He was recently changed to a full mask and there can have him come back at the [place] for a secondary sleep study with his sleep mask to make sure that it is therapeutic.  He still having some sinus headaches but they are not as frequent and says he is no longer on the daily medication for headaches.  He still gets a lot of postnasal drip and has been taking his allergy medicines and sprays and using his Nettie pot.  He also complains of some nasal dryness.  Past medical history, Surgical history, Family history not pertinant except as noted below, Social history, Allergies, and medications have been entered into the medical record, reviewed, and corrections made.   Review of Systems: No fevers, chills, night sweats, weight loss, chest pain, or shortness of breath.   Objective:    General: We ll Developed, well nourished, and in no acute distress.  Neuro: Alert and oriented x3, extra-ocular muscles intact, sensation grossly intact.  HEENT: Normocephalic, atraumatic  Skin: Warm and dry, no rashes. Cardiac: Regular rate and rhythm, no murmurs rubs or gallops, no lower extremity edema.  Respiratory: Clear to auscultation bilaterally. Not using accessory muscles, speaking in full sentences.   Impression and Recommendations:    HTN - Well controlled. Continue current regimen. Follow up in  6 motnhs. Make sure drinking plenty of water each day.   OSA -a second  sleep study on his CPAP to make sure it is therapeutic with his new full mask.  Right hand arthritis at the base of the thumb-we will print prescription for Voltaren gel to see if he is able to get it through the [place].  Allergic rhinitis with significant postnasal drip.  Continue current regimen and therapies.  He also asked for a refill on his inhalers but says he is not very clear on whether or not he actually has a diagnosis of asthma or reactive airways disease.  He has not had a breathing test in quite some time.  He is willing to come back to have that done.

## 2018-01-30 NOTE — Patient Instructions (Signed)
Can use [place] nasal moisturizer at night.

## 2018-01-31 ENCOUNTER — Ambulatory Visit: Payer: No Typology Code available for payment source | Admitting: Family Medicine

## 2018-01-31 ENCOUNTER — Other Ambulatory Visit: Payer: Self-pay | Admitting: Family Medicine

## 2018-02-14 ENCOUNTER — Ambulatory Visit (INDEPENDENT_AMBULATORY_CARE_PROVIDER_SITE_OTHER): Payer: Medicare Other | Admitting: Family Medicine

## 2018-02-14 VITALS — BP 119/58 | HR 68 | Ht 67.0 in | Wt 199.0 lb

## 2018-02-14 DIAGNOSIS — J984 Other disorders of lung: Secondary | ICD-10-CM

## 2018-02-14 DIAGNOSIS — R053 Chronic cough: Secondary | ICD-10-CM

## 2018-02-14 DIAGNOSIS — R05 Cough: Secondary | ICD-10-CM | POA: Diagnosis not present

## 2018-02-14 DIAGNOSIS — R0602 Shortness of breath: Secondary | ICD-10-CM | POA: Diagnosis not present

## 2018-02-14 MED ORDER — ALBUTEROL SULFATE (2.5 MG/3ML) 0.083% IN NEBU
2.5000 mg | INHALATION_SOLUTION | Freq: Once | RESPIRATORY_TRACT | Status: AC
  Administered 2018-02-14: 2.5 mg via RESPIRATORY_TRACT

## 2018-02-14 NOTE — Progress Notes (Signed)
Subjective:    Patient ID: Brandon [name], male    DOB: 12/06/1950, 67 y.o.   MRN: [id number]  HPI 67 year old male comes in today to follow-up on shortness of breath.  He was last here he asked for refill on his inhaler.  We will try to figure out what his underlying diagnosis was whether it was COPD, asthma etc.  He had been given an inhaler in the past and says it does tend to help if he gets a lot of congestion in his throat and chest and starts to cough.  He is a former smoker but says he quit in the 80s.  Over the years he has had multiple jobs.  He did work at [employer] for short period of time.  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  He does get intermittent chest congestion and cough but it is not always persistent.  In the [employer] he was also overseas for a year in [place].   Review of Systems  BP (!) 119/58   Pulse 68   Ht 5\' 7"  (1.702 m)   Wt 199 lb (90.3 kg)   BMI 31.17 kg/m     No Known Allergies  Past Medical History:  Diagnosis Date  . ED (erectile dysfunction)   . GERD (gastroesophageal reflux disease)   . Hepatitis C    [phone], [facility] at [facility]  . Hyperlipidemia   . Hypertension   . Nose fracture     Past Surgical History:  Procedure Laterality Date  . FOOT SURGERY Right    1980  . FOOT SURGERY Left    1980  . INGUINAL HERNIA REPAIR Right    2009    Social History   Socioeconomic History  . Marital status: Married    Spouse name: Mahad Newstrom  . Number of children: 2  . Years of education: Not on file  . Highest education level: Not on file  Occupational History  . Occupation: Investment banker, corporate Rep    Comment: Dept of [employer] in [place]  . Financial resource strain: Not on file  . Food insecurity:    Worry: Not on file    Inability: Not on file  . Transportation needs:    Medical: Not on file    Non-medical: Not on file   Tobacco Use  . Smoking status: Former Smoker    Types: Cigarettes    Last attempt to quit: 10/22/1996    Years since quitting: 21.3  . Smokeless tobacco: Never Used  Substance and Sexual Activity  . Alcohol use: No  . Drug use: No  . Sexual activity: Yes    Partners: Female  Lifestyle  . Physical activity:    Days per week: Not on file    Minutes per session: Not on file  . Stress: Not on file  Relationships  . Social connections:    Talks on phone: Not on file    Gets together: Not on file    Attends religious service: Not on file    Active member of club or organization: Not on file    Attends meetings of clubs or organizations: Not on file    Relationship status: Not on file  . Intimate partner violence:    Fear of current or ex partner: Not on file    Emotionally abused: Not on file    Physically  abused: Not on file    Forced sexual activity: Not on file  Other Topics Concern  . Not on file  Social History Narrative   No caffeine.  Occ exercise. Has a 2 years associate degree. Previously worked at [employer] for 12 years. Also work to [employer].    Family History  Problem Relation Age of Onset  . Breast cancer Sister   . Depression Sister   . Hyperlipidemia Unknown   . Ovarian cancer Sister   . Hypertension Unknown     Outpatient Encounter Medications as of 02/14/2018  Medication Sig  . albuterol (PROAIR HFA) 108 (90 Base) MCG/ACT inhaler Inhale 2 puffs into the lungs every 6 (six) hours as needed for wheezing or shortness of breath.  [name] atorvastatin (LIPITOR) 20 MG tablet Take 1 tablet (20 mg total) by mouth daily.  . baclofen (LIORESAL) 10 MG tablet Take 1 tablet (10 mg total) by mouth every 8 (eight) hours as needed (migraine (may cause drowsiness)).  . budesonide-formoterol (SYMBICORT) 80-4.5 MCG/ACT inhaler Inhale 2 puffs into the lungs 2 (two) times daily.  . clindamycin (CLEOCIN T) 1 % lotion Apply topically 2 (two) times daily.  . clindamycin (CLEOCIN T) 1 % SWAB   .  clotrimazole-betamethasone (LOTRISONE) cream Apply topically 2 (two) times daily.  . diclofenac sodium (VOLTAREN) 1 % GEL Apply 2 g topically 4 (four) times daily. To affected areas  . fluticasone (FLONASE) 50 MCG/ACT nasal spray Place 2 sprays into both nostrils daily.  . hydrocortisone (ANUSOL-HC) 25 MG suppository Place 25 mg rectally as needed. Reported on 04/19/2016  . hydrocortisone 2.5 % cream Apply topically as needed. Reported on 04/19/2016  . ipratropium (ATROVENT) 0.06 % nasal spray Place 2 sprays into both nostrils every 4 (four) hours as needed for rhinitis.  [name] losartan-hydrochlorothiazide (HYZAAR) 100-25 MG tablet Take 1 tablet by mouth daily.  . montelukast (SINGULAIR) 10 MG tablet TAKE 1 TABLET BY MOUTH AT BEDTIME.  [name] omeprazole (PRILOSEC) 40 MG capsule TAKE 1 CAPSULE BY MOUTH EVERY DAY  . tadalafil (CIALIS) 5 MG tablet Take 5 mg by mouth daily.  . tamsulosin (FLOMAX) 0.4 MG CAPS capsule Take 0.4 mg by mouth.  . terbinafine (LAMISIL) 1 % cream Apply 1 application topically 2 (two) times daily.  . traZODone (DESYREL) 50 MG tablet Take 50 mg by mouth at bedtime.  . valACYclovir (VALTREX) 500 MG tablet Take 1 tablet (500 mg total) by mouth daily.  . [EXPIRED] albuterol (PROVENTIL) (2.5 MG/3ML) 0.083% nebulizer solution 2.5 mg    No facility-administered encounter medications on file as of 02/14/2018.          Objective:   Physical Exam  Constitutional: He is oriented to person, place, and time. He appears well-developed and well-nourished.  HENT:  Head: Normocephalic and atraumatic.  Eyes: Conjunctivae and EOM are normal.  Cardiovascular: Normal rate.  Pulmonary/Chest: Effort normal.  Neurological: He is alert and oriented to person, place, and time.  Skin: Skin is dry. No pallor.  Psychiatric: He has a normal mood and affect. His behavior is normal.  Vitals reviewed.       Assessment & Plan:  Restrictive lung disease-spirometry today showed FVC of 70%, FEV1 of 73%  with a ratio of 77%.  Medically this was his first try at spirometry so he did not have the best curve pattern.  But being that there is some evidence of restrictive disease I would like to refer him to pulmonology.  He is overweight but is not obese.  Certainly he could have some scar tissue from environmental exposures from some of his jobs over the years.  He did have a CT of the chest done at the [place] on January 18, 2017.  There was suggestion of very fine centrilobular nodules in the basilar right lower lobe which they felt could reflect some chronic lung bronchiolar scarring.  Also been following a stable 6 mm subpleural right middle lobe nodule.  Similar nodule that is about 5 mm in the posterior left lower lobe as well.  Please see scanned document for further detail.

## 2018-02-20 ENCOUNTER — Ambulatory Visit (INDEPENDENT_AMBULATORY_CARE_PROVIDER_SITE_OTHER): Payer: Medicare Other | Admitting: Pulmonary Disease

## 2018-02-20 ENCOUNTER — Encounter: Payer: Self-pay | Admitting: Pulmonary Disease

## 2018-02-20 VITALS — BP 136/82 | HR 58 | Ht 67.0 in | Wt 198.0 lb

## 2018-02-20 DIAGNOSIS — G4733 Obstructive sleep apnea (adult) (pediatric): Secondary | ICD-10-CM

## 2018-02-20 DIAGNOSIS — R059 Cough, unspecified: Secondary | ICD-10-CM

## 2018-02-20 DIAGNOSIS — R05 Cough: Secondary | ICD-10-CM | POA: Diagnosis not present

## 2018-02-20 NOTE — Patient Instructions (Addendum)
Will schedule following lab tests: CMET, CBC with differential, RAST with IgE Will schedule CT chest with contrast, pulmonary function test, and overnight oxygen test on CPAP Will try to get copy of your CPAP download from the [facility]  Follow up in 2 to 3 weeks with Dr. Halford Chessman or Nurse Practitioner

## 2018-02-20 NOTE — Progress Notes (Signed)
   Subjective:    Patient ID: Brandon [name], male    DOB: 1950/11/26, 67 y.o.   MRN: [id number]  HPI    Review of Systems  Constitutional: Negative for fever and unexpected weight change.  HENT: Positive for ear pain, postnasal drip, rhinorrhea, sinus pressure, sneezing and sore throat. Negative for congestion, dental problem, nosebleeds and trouble swallowing.   Eyes: Negative for redness and itching.  Respiratory: Positive for cough, chest tightness, shortness of breath and wheezing.   Cardiovascular: Negative for palpitations and leg swelling.  Gastrointestinal: Negative for nausea and vomiting.  Genitourinary: Negative for dysuria.  Musculoskeletal: Positive for joint swelling.  Skin: Negative for rash.  Allergic/Immunologic: Positive for environmental allergies. Negative for food allergies and immunocompromised state.  Neurological: Positive for headaches.  Hematological: Does not bruise/bleed easily.  Psychiatric/Behavioral: Negative for dysphoric mood. The patient is nervous/anxious.        Objective:   Physical Exam        Assessment & Plan:

## 2018-02-20 NOTE — Progress Notes (Signed)
[facility]  Chief Complaint  Patient presents with  . pulm consult    Pt referred by Dr. Madilyn Fireman MD. Pt has SOB with exertion, productive cough- brown/yellow, wheezing, chest tightness. Pt has cpap machine through [facility].     Vital signs: BP 136/82 (BP Location: Left Arm, Cuff Size: Normal)   Pulse (!) 58   Ht 5\' 7"  (1.702 m)   Wt 198 lb (89.8 kg)   SpO2 96%   BMI 31.01 kg/m   History of Present Illness: Brandon [name] is a 67 y.o. male former smoker with a cough.  He has noticed more trouble with his cough for the past several weeks.  He is bringing up brown to yellow sputum.  He sometimes gets blood streaking in his phlegm.  He will also get wheezing and tight in his chest.  He has allergies and has been on singulair for years.  He was recently started on symbicort and proair.  These help some.  He had spirometry recently that was suggestive of a restrictive defect.  CT chest from [place] in March 2018 showed some nodularity.  He does get winded more easily with activity.  He worked several jobs.  He was in the [employer] in the 1970's.  He thinks he had pneumonia years ago.  No history of TB.  No animal/bird exposures.  Quit smoking years ago.  Never told he had asthma or COPD.  No history of thromboembolic disease or connective tissue disease.  No problem with his swallowing.  He was diagnosed with sleep apnea about 1 year ago at the [place] and has been using CPAP.  His wife has told him his breathing gets very shallow at night, and this has persistent even with CPAP use.   Physical Exam:  General - pleasant Eyes - pupils reactive, wears glasses ENT - no sinus tenderness, no oral exudate, no LAN, wears hearing aids, TM clear Cardiac - regular, no murmur Chest - no wheeze, rales Abd - soft, non tender Ext - no edema Skin - no rashes Neuro - normal strength Psych - normal mood  Discussion: He has persistent productive cough and  allergies.  He has history of smoking.  Previous CT chest showed nodularity.  Recent spirometry was suggestive of restrictive defect.  He has history of sleep apnea, and reports shallow breathing at night.  Assessment/Plan:  Cough. - continue symbicort, singulair, prn albuterol for now - will get full PFTs, CT chest with contrast - check labs: CBC with diff, CMET, RAST with IgE  Obstructive sleep apnea. - will try to get copy of his CPAP download from the [place] - will arrange for overnight oxygen test with CPAP  Allergic rhinitis. - continue flonase, singulair - RAST with IgE   Patient Instructions  Will schedule following lab tests: CMET, CBC with differential, RAST with IgE Will schedule CT chest with contrast, pulmonary function test, and overnight oxygen test on CPAP Will try to get copy of your CPAP download from the [facility]  Follow up in 2 to 3 weeks with Dr. Halford Chessman or Nurse Practitioner    Chesley Mires, MD [place] 02/20/2018, 10:22 AM  Flow Sheet  Pulmonary tests: CT chest 01/18/17 >> calcified granulomas, 5 mm nodule LUL, 5 mm nodule LLL, centrilobular nodularity RML Spirometry 02/14/18 >> FVC 70%, FEV1 73%, FEV1% 77  Sleep tests:  Review of Systems: Constitutional: Negative for fever and unexpected weight change.  HENT: Positive for ear pain,  postnasal drip, rhinorrhea, sinus pressure, sneezing and sore throat. Negative for congestion, dental problem, nosebleeds and trouble swallowing.   Eyes: Negative for redness and itching.  Respiratory: Positive for cough, chest tightness, shortness of breath and wheezing.   Cardiovascular: Negative for palpitations and leg swelling.  Gastrointestinal: Negative for nausea and vomiting.  Genitourinary: Negative for dysuria.  Musculoskeletal: Positive for joint swelling.  Skin: Negative for rash.  Allergic/Immunologic: Positive for environmental allergies. Negative for food allergies and immunocompromised state.    Neurological: Positive for headaches.  Hematological: Does not bruise/bleed easily.  Psychiatric/Behavioral: Negative for dysphoric mood. The patient is nervous/anxious.    Past Medical History: He  has a past medical history of ED (erectile dysfunction), GERD (gastroesophageal reflux disease), Hepatitis C, Hyperlipidemia, Hypertension, and Nose fracture.  Past Surgical History: He  has a past surgical history that includes Foot surgery (Right); Foot surgery (Left); and Inguinal hernia repair (Right).  Family History: His family history includes Breast cancer in his sister; Depression in his sister; Hyperlipidemia in his unknown relative; Hypertension in his unknown relative; Ovarian cancer in his sister.  Social History: He  reports that he quit smoking about 21 years ago. His smoking use included cigarettes. He has a 22.50 pack-year smoking history. He has never used smokeless tobacco. He reports that he does not drink alcohol or use drugs.  Medications: Allergies as of 02/20/2018   No Known Allergies     Medication List        Accurate as of 02/20/18 10:22 AM. Always use your most recent med list.          albuterol 108 (90 Base) MCG/ACT inhaler Commonly known as:  PROAIR HFA Inhale 2 puffs into the lungs every 6 (six) hours as needed for wheezing or shortness of breath.   atorvastatin 20 MG tablet Commonly known as:  LIPITOR Take 1 tablet (20 mg total) by mouth daily.   baclofen 10 MG tablet Commonly known as:  LIORESAL Take 1 tablet (10 mg total) by mouth every 8 (eight) hours as needed (migraine (may cause drowsiness)).   budesonide-formoterol 80-4.5 MCG/ACT inhaler Commonly known as:  SYMBICORT Inhale 2 puffs into the lungs 2 (two) times daily.   clindamycin 1 % lotion Commonly known as:  CLEOCIN T Apply topically 2 (two) times daily.   clindamycin 1 % Swab Commonly known as:  CLEOCIN T   clotrimazole-betamethasone cream Commonly known as:  LOTRISONE Apply  topically 2 (two) times daily.   diclofenac sodium 1 % Gel Commonly known as:  VOLTAREN Apply 2 g topically 4 (four) times daily. To affected areas   fluticasone 50 MCG/ACT nasal spray Commonly known as:  FLONASE Place 2 sprays into both nostrils daily.   hydrocortisone 2.5 % cream Apply topically as needed. Reported on 04/19/2016   hydrocortisone 25 MG suppository Commonly known as:  ANUSOL-HC Place 25 mg rectally as needed. Reported on 04/19/2016   ipratropium 0.06 % nasal spray Commonly known as:  ATROVENT Place 2 sprays into both nostrils every 4 (four) hours as needed for rhinitis.   losartan-hydrochlorothiazide 100-25 MG tablet Commonly known as:  HYZAAR Take 1 tablet by mouth daily.   montelukast 10 MG tablet Commonly known as:  SINGULAIR TAKE 1 TABLET BY MOUTH AT BEDTIME.   omeprazole 40 MG capsule Commonly known as:  PRILOSEC TAKE 1 CAPSULE BY MOUTH EVERY DAY   tadalafil 5 MG tablet Commonly known as:  CIALIS Take 5 mg by mouth daily.   tamsulosin 0.4 MG  Caps capsule Commonly known as:  FLOMAX Take 0.4 mg by mouth.   terbinafine 1 % cream Commonly known as:  LAMISIL Apply 1 application topically 2 (two) times daily.   traZODone 50 MG tablet Commonly known as:  DESYREL Take 50 mg by mouth at bedtime.   valACYclovir 500 MG tablet Commonly known as:  VALTREX Take 1 tablet (500 mg total) by mouth daily.

## 2018-02-23 ENCOUNTER — Telehealth: Payer: Self-pay | Admitting: Pulmonary Disease

## 2018-02-23 NOTE — Telephone Encounter (Signed)
CPAP 12/18/17 to 01/16/18 >> used on 25 of 30 nights with average 7 hrs 6 min.  Average AHI 6.4 with median CPAP 12 and 95 th percentile CPAP 15 cm H2O.   Please let him know that CPAP report from [place] reviewed and showed good control of sleep apnea on current settings.

## 2018-02-24 NOTE — Telephone Encounter (Signed)
Called and spoke with patient regarding results.  Informed the patient of results and recommendations today. Pt verbalized understanding and denied any questions or concerns at this time.  Nothing further needed.  

## 2018-03-01 LAB — COMPREHENSIVE METABOLIC PANEL
ALBUMIN: 4.5 g/dL (ref 3.6–4.8)
ALT: 24 IU/L (ref 0–44)
AST: 22 IU/L (ref 0–40)
Albumin/Globulin Ratio: 1.6 (ref 1.2–2.2)
Alkaline Phosphatase: 106 IU/L (ref 39–117)
BUN / CREAT RATIO: 11 (ref 10–24)
BUN: 13 mg/dL (ref 8–27)
Bilirubin Total: 0.3 mg/dL (ref 0.0–1.2)
CHLORIDE: 100 mmol/L (ref 96–106)
CO2: 23 mmol/L (ref 20–29)
Calcium: 9.9 mg/dL (ref 8.6–10.2)
Creatinine, Ser: 1.17 mg/dL (ref 0.76–1.27)
GFR calc non Af Amer: 64 mL/min/{1.73_m2} (ref >59)
GFR, EST AFRICAN AMERICAN: 74 mL/min/{1.73_m2} (ref >59)
Globulin, Total: 2.8 g/dL (ref 1.5–4.5)
Glucose: 119 mg/dL — ABNORMAL HIGH (ref 65–99)
Potassium: 4 mmol/L (ref 3.5–5.2)
SODIUM: 139 mmol/L (ref 134–144)
TOTAL PROTEIN: 7.3 g/dL (ref 6.0–8.5)

## 2018-03-01 LAB — ALLERGENS W/TOTAL IGE AREA 2
Alternaria Alternata IgE: <0.1 kU/L
Aspergillus Fumigatus IgE: <0.1 kU/L
Cat Dander IgE: <0.1 kU/L
Cladosporium Herbarum IgE: <0.1 kU/L
Common Silver Birch IgE: <0.1 kU/L
IGE (IMMUNOGLOBULIN E), SERUM: 15 [IU]/mL (ref 6–495)
Johnson Grass IgE: <0.1 kU/L
Maple/Box Elder IgE: <0.1 kU/L
Mouse Urine IgE: <0.1 kU/L
Pecan, Hickory IgE: <0.1 kU/L
Penicillium Chrysogen IgE: <0.1 kU/L
Pigweed, Rough IgE: <0.1 kU/L
Ragweed, Short IgE: <0.1 kU/L
Sheep Sorrel IgE Qn: <0.1 kU/L
Timothy Grass IgE: <0.1 kU/L
White Mulberry IgE: <0.1 kU/L

## 2018-03-01 LAB — CBC WITH DIFFERENTIAL
Basophils Absolute: 0.1 10*3/uL (ref 0.0–0.2)
Basos: 1 %
EOS (ABSOLUTE): 0.1 10*3/uL (ref 0.0–0.4)
Eos: 2 %
Hematocrit: 40.8 % (ref 37.5–51.0)
Hemoglobin: 13.3 g/dL (ref 13.0–17.7)
IMMATURE GRANS (ABS): 0 10*3/uL (ref 0.0–0.1)
Immature Granulocytes: 0 %
LYMPHS: 34 %
Lymphocytes Absolute: 2 10*3/uL (ref 0.7–3.1)
MCH: 23.1 pg — ABNORMAL LOW (ref 26.6–33.0)
MCHC: 32.6 g/dL (ref 31.5–35.7)
MCV: 71 fL — ABNORMAL LOW (ref 79–97)
Monocytes Absolute: 0.3 10*3/uL (ref 0.1–0.9)
Monocytes: 5 %
NEUTROS ABS: 3.4 10*3/uL (ref 1.4–7.0)
NEUTROS PCT: 58 %
RBC: 5.75 x10E6/uL (ref 4.14–5.80)
RDW: 17.8 % — ABNORMAL HIGH (ref 12.3–15.4)
WBC: 5.9 10*3/uL (ref 3.4–10.8)

## 2018-03-03 ENCOUNTER — Telehealth: Payer: Self-pay | Admitting: Pulmonary Disease

## 2018-03-03 NOTE — Telephone Encounter (Signed)
RAST 02/20/18 >> negative  CBC Latest Ref Rng & Units 02/20/2018 09/07/2014 10/29/2013  WBC 3.4 - 10.8 x10E3/uL 5.9 8.3 7.9  Hemoglobin 13.0 - 17.7 g/dL 13.3 14.8 14.8  Hematocrit 37.5 - 51.0 % 40.8 44.4 43.4  Platelets 150 - 400 K/uL - 177 163    CMP Latest Ref Rng & Units 02/20/2018 01/30/2018 08/05/2017  Glucose 65 - 99 mg/dL 119(H) 107(H) 126(H)  BUN 8 - 27 mg/dL 13 15 13   Creatinine 0.76 - 1.27 mg/dL 1.17 1.11 1.28(H)  Sodium 134 - 144 mmol/L 139 139 139  Potassium 3.5 - 5.2 mmol/L 4.0 4.2 3.9  Chloride 96 - 106 mmol/L 100 102 102  CO2 20 - 29 mmol/L 23 30 29   Calcium 8.6 - 10.2 mg/dL 9.9 9.9 9.3  Total Protein 6.0 - 8.5 g/dL 7.3 - 6.8  Total Bilirubin 0.0 - 1.2 mg/dL 0.3 - 0.4  Alkaline Phos 39 - 117 IU/L 106 - -  AST 0 - 40 IU/L 22 - 18  ALT 0 - 44 IU/L 24 - 21     Please let him know his lab tests were normal.

## 2018-03-04 ENCOUNTER — Telehealth: Payer: Self-pay | Admitting: Pulmonary Disease

## 2018-03-04 NOTE — Telephone Encounter (Signed)
Called and spoke with patient regarding results.  Informed the patient of results and recommendations today. Pt verbalized understanding and denied any questions or concerns at this time.  Nothing further needed.  

## 2018-03-04 NOTE — Telephone Encounter (Signed)
Spoke with Corene Cornea at [facility], states that pt is picking up ONO tomorrow morning to have testing done tomorrow night.  Corene Cornea states he will have the results to [place] by Monday afternoon for pt's appt.  I thanked Corene Cornea for solving this.  Nothing further needed at this time.

## 2018-03-04 NOTE — Telephone Encounter (Signed)
Corene Cornea is calling back [phone] ONO will be pick up by 9:30am

## 2018-03-04 NOTE — Telephone Encounter (Signed)
Pt called saying that he has not been contacted for ONO yet.  Pt called [facility] and was advised that they have not received orders.  Pt wants to know if he needs to keep 5/20 OV.  I advised pt to keep OV because we would also be reviewing 5/17 CT chest and 5/20 PFT.  Pt expressed understanding.  Per chart ONO was ordered on 5/2 and confirmed receipt dated on 5/6 by [facility].  Called [facility] and spoke with Corene Cornea, who advised that ONO was ordered under incorrect MRN on their end.  Corene Cornea is correcting this asap and looking to expedite this ONO.  Corene Cornea will call back to let us know if this can be done this week.  Will await call back from [place].

## 2018-03-06 DIAGNOSIS — R0902 Hypoxemia: Secondary | ICD-10-CM | POA: Diagnosis not present

## 2018-03-06 DIAGNOSIS — J449 Chronic obstructive pulmonary disease, unspecified: Secondary | ICD-10-CM | POA: Diagnosis not present

## 2018-03-07 ENCOUNTER — Ambulatory Visit (INDEPENDENT_AMBULATORY_CARE_PROVIDER_SITE_OTHER)
Admission: RE | Admit: 2018-03-07 | Discharge: 2018-03-07 | Disposition: A | Discharge status: Home / Self Care | Payer: Medicare Other | Source: Ambulatory Visit | Attending: Pulmonary Disease | Admitting: Pulmonary Disease

## 2018-03-07 ENCOUNTER — Telehealth: Payer: Self-pay | Admitting: Pulmonary Disease

## 2018-03-07 DIAGNOSIS — R05 Cough: Secondary | ICD-10-CM | POA: Diagnosis not present

## 2018-03-07 DIAGNOSIS — R918 Other nonspecific abnormal finding of lung field: Secondary | ICD-10-CM | POA: Diagnosis not present

## 2018-03-07 DIAGNOSIS — R059 Cough, unspecified: Secondary | ICD-10-CM

## 2018-03-07 MED ORDER — IOPAMIDOL (ISOVUE-300) INJECTION 61%
80.0000 mL | Freq: Once | INTRAVENOUS | Status: AC | PRN
  Administered 2018-03-07: 80 mL via INTRAVENOUS

## 2018-03-07 NOTE — Telephone Encounter (Signed)
CT chest 03/07/18 >> 3 small nodules, largest 6 mm in RML, small hiatal hernia   CT chest reviewed.  He has ROV with Tammy Parrett on 03/10/18.  Can d/w pt at time of ROV.  He will need CT chest w/o contrast in 6 months (November 2019).

## 2018-03-07 NOTE — Telephone Encounter (Signed)
Received a call from radiology. Erline Levine with radiology wanted to notify Dr. Halford Chessman that patient's chest CT is available and it shows pulmonary nodules, largest being 44mm in right middle lobe and a lesion left kidney.   Routing to VS and paging.

## 2018-03-10 ENCOUNTER — Ambulatory Visit (INDEPENDENT_AMBULATORY_CARE_PROVIDER_SITE_OTHER): Payer: Medicare Other | Admitting: Pulmonary Disease

## 2018-03-10 ENCOUNTER — Telehealth: Payer: Self-pay | Admitting: Pulmonary Disease

## 2018-03-10 ENCOUNTER — Encounter: Payer: Self-pay | Admitting: Pulmonary Disease

## 2018-03-10 DIAGNOSIS — R059 Cough, unspecified: Secondary | ICD-10-CM

## 2018-03-10 DIAGNOSIS — R911 Solitary pulmonary nodule: Secondary | ICD-10-CM | POA: Diagnosis not present

## 2018-03-10 DIAGNOSIS — G4733 Obstructive sleep apnea (adult) (pediatric): Secondary | ICD-10-CM | POA: Diagnosis not present

## 2018-03-10 DIAGNOSIS — K2100 Gastro-esophageal reflux disease with esophagitis, without bleeding: Secondary | ICD-10-CM

## 2018-03-10 DIAGNOSIS — R05 Cough: Secondary | ICD-10-CM

## 2018-03-10 DIAGNOSIS — Z9989 Dependence on other enabling machines and devices: Secondary | ICD-10-CM | POA: Diagnosis not present

## 2018-03-10 DIAGNOSIS — J45909 Unspecified asthma, uncomplicated: Secondary | ICD-10-CM | POA: Diagnosis not present

## 2018-03-10 LAB — PULMONARY FUNCTION TEST
DL/VA % PRED: 99 %
DL/VA: 4.42 ml/min/mmHg/L
DLCO COR: 21.18 ml/min/mmHg
DLCO cor % pred: 73 %
DLCO unc % pred: 71 %
DLCO unc: 20.69 ml/min/mmHg
FEF 25-75 Post: 1.99 L/sec
FEF 25-75 Pre: 1.78 L/sec
FEF2575-%CHANGE-POST: 11 %
FEF2575-%PRED-PRE: 75 %
FEF2575-%Pred-Post: 84 %
FEV1-%CHANGE-POST: 1 %
FEV1-%PRED-POST: 93 %
FEV1-%Pred-Pre: 91 %
FEV1-POST: 2.47 L
FEV1-Pre: 2.44 L
FEV1FVC-%CHANGE-POST: 7 %
FEV1FVC-%Pred-Pre: 100 %
FEV6-%Change-Post: -4 %
FEV6-%PRED-PRE: 91 %
FEV6-%Pred-Post: 87 %
FEV6-PRE: 3.06 L
FEV6-Post: 2.93 L
FEV6FVC-%Change-Post: 2 %
FEV6FVC-%PRED-PRE: 102 %
FEV6FVC-%Pred-Post: 105 %
FVC-%Change-Post: -5 %
FVC-%PRED-POST: 84 %
FVC-%PRED-PRE: 89 %
FVC-POST: 2.97 L
FVC-PRE: 3.14 L
PRE FEV1/FVC RATIO: 78 %
PRE FEV6/FVC RATIO: 98 %
Post FEV1/FVC ratio: 83 %
Post FEV6/FVC ratio: 100 %
RV % pred: 78 %
RV: 1.79 L
TLC % pred: 79 %
TLC: 5.15 L

## 2018-03-10 MED ORDER — BUDESONIDE-FORMOTEROL FUMARATE 80-4.5 MCG/ACT IN AERO: 2.0000 | INHALATION_SPRAY | Freq: Two times a day (BID) | RESPIRATORY_TRACT | 0 refills | Status: DC

## 2018-03-10 NOTE — Patient Instructions (Addendum)
Keep up the hard work using your CPAP Follow-up with Dr. Halford Chessman in 3 months >>>Keep sleep diary documenting sleep events observed by spouse Continue close follow-up with office  >>>especially if you have any changes in your breathing, sleep, or issues using CPAP  Thank you for completing your pulmonary function test today.  Great work using your CPAP.  Repeat CT in 6 months as follow up.   Restart and use your Symbicort 2 puffs twice a day no matter what >>>Sample provided today  Use your albuterol rescue inhaler as needed for shortness of breath  [facility]  Contact the office whenever the [facility]AP to BiPAP. >>>We will contact the [facility] to [facility] to get results of your sleep study   Please contact the office if your symptoms worsen or you have concerns that you are not improving.    Wyn Quaker FNP-C

## 2018-03-10 NOTE — Assessment & Plan Note (Signed)
Repeat CT in 6 months Follow-up with Dr. Halford Chessman in 3 months

## 2018-03-10 NOTE — Assessment & Plan Note (Addendum)
Mild small airway obstruction on spirometry, no significant bronchodilator response, mild restriction  Follow-up with Dr. Halford Chessman in 3 month Thank you for completing your pulmonary function test today.    Restart and use your Symbicort 2 puffs twice a day no matter what >>>Sample provided today  >>> Reviewed with patient today how to take Symbicort appropriately Use your albuterol rescue inhaler as needed for shortness of breath

## 2018-03-10 NOTE — Assessment & Plan Note (Signed)
Continue Nexium Continue to make dietary changes avoiding spicy food, coffee, sodas, late-night meals Follow-up with primary care regarding GERD symptoms that they do not improve.

## 2018-03-10 NOTE — Assessment & Plan Note (Signed)
Keep up the hard work using your CPAP Follow-up with Dr. Halford Chessman in 3 months >>>Keep sleep diary documenting sleep events observed by spouse Continue close follow-up with office  >>>especially if you have any changes in your breathing, sleep, or issues using CPAP  Thank you for completing your pulmonary function test today.  Great work using your CPAP.  [facility]  Contact the office whenever the [facility] switch her CPAP to BiPAP. >>>We will contact the [facility] to try to get results of your sleep study

## 2018-03-10 NOTE — Progress Notes (Signed)
@Patient  ID: Brandon [name], male    DOB: Jul 28, 1951, 67 y.o.   MRN: [id number]  Chief Complaint  Patient presents with  . Follow-up    cough    Referring provider: Hali Marry, *  HPI: 67 year old male former smoker with a cough.  Last seen in office on 02/20/2018.  Quit smoking 21 years ago.  22.5-pack-year smoking history.  Continue Symbicort Singulair and as needed albuterol.  Will get full PFTs, CT chest with contrast, CBC with differential, c-Met, respiratory allergy panel with IgE.  Also try to get CPAP download from the [place].  Will arrange overnight oxygen test with CPAP  CPAP 12/18/17 to 01/16/18 >> used on 25 of 30 nights with average 7 hrs 6 min.  Average AHI 6.4 with median CPAP 12 and 95 th percentile CPAP 15 cm H2O.   Please let him know that CPAP report from [place] reviewed and showed good control of sleep apnea on current settings.  5/17 CT chest, and 5/20 pulmonary function test     Pulmonary tests: 03/10/18 - PFT -  03/07/18 - CT -CT chest without contrast- few solid subpleural pulmonary nodules largest 6 mm, will need repeat CT in 6 months. CT chest 01/18/17 >> calcified granulomas, 5 mm nodule LUL, 5 mm nodule LLL, centrilobular nodularity RML Spirometry 02/14/18 >> FVC 70%, FEV1 73%, FEV1% 77  Sleep tests: Recently completed home sleep study last year with the [place].  As well as a inpatient sleep study last week.  We do not currently have those results on file.  03/10/18  Office Visit: Patient reporting office today follow-up with cough.  Patient also following up to discuss 03/10/2018 pulmonary function test results, as well as 03/07/2018 CT chest results.  CT chest showed 3 small nodules largest 6 mm and right medial lobe.  We will repeat CT chest in 6 months.   CPAP compliance is excellent.  Patient showing 30 out of 30 days use, with an average usage each day for 6 hours and 51 minutes.  With an AHI of 0.3.  Patient reporting no concerns or problems with  his CPAP.  Reporting no increase in fatigue or daytime drowsiness.  Patient is still reporting that he is having events when he is sleeping at night where he takes shallow breaths and it scares his wife.  He said that this has improved steadily with his CPAP use but is still happening often per his wife.   Tests pulmonary function tests completed today.  Showing mild small airway obstruction, no significant bronchodilator response, mild restriction.  I explained these results to the patient.  Patient also reporting that he has not been taking his Symbicort as prescribed.  Patient says he has been using it on an as-needed basis maybe once or twice a week.  Patient reporting no issues obtaining medications from the pharmacy or affording them.  Patient returned to ONO study equipment today, we do not have a download or those results yet.  Patient also reporting increases in GERD symptoms but reports his diet has been poor and he has not been adhering to avoiding spicy foods, not eating late at night.   No Known Allergies  Immunization History  Administered Date(s) Administered  . Influenza,inj,Quad PF,6+ Mos 07/14/2014, 08/23/2015  . Influenza-Unspecified 10/01/2011, 08/19/2012, 08/03/2013, 08/15/2016, 08/08/2017  . Pneumococcal Conjugate-13 10/01/2016  . Pneumococcal Polysaccharide-23 01/15/2012  . Tdap 11/18/2014  . Zoster 11/18/2014    Past Medical History:  Diagnosis Date  . ED (erectile  dysfunction)   . GERD (gastroesophageal reflux disease)   . Hepatitis C    [phone], treated at [facility]  . Hyperlipidemia   . Hypertension   . Nose fracture     Tobacco History: Social History   Tobacco Use  Smoking Status Former Smoker  . Packs/day: 1.50  . Years: 15.00  . Pack years: 22.50  . Types: Cigarettes  . Last attempt to quit: 10/22/1996  . Years since quitting: 21.3  Smokeless Tobacco Never Used   Counseling given: Not Answered   Outpatient Encounter Medications as of  03/10/2018  Medication Sig  . albuterol (PROAIR HFA) 108 (90 Base) MCG/ACT inhaler Inhale 2 puffs into the lungs every 6 (six) hours as needed for wheezing or shortness of breath.  [name] atorvastatin (LIPITOR) 20 MG tablet Take 1 tablet (20 mg total) by mouth daily.  . baclofen (LIORESAL) 10 MG tablet Take 1 tablet (10 mg total) by mouth every 8 (eight) hours as needed (migraine (may cause drowsiness)).  . budesonide-formoterol (SYMBICORT) 80-4.5 MCG/ACT inhaler Inhale 2 puffs into the lungs 2 (two) times daily.  . clindamycin (CLEOCIN T) 1 % lotion Apply topically 2 (two) times daily.  . clindamycin (CLEOCIN T) 1 % SWAB   . clotrimazole-betamethasone (LOTRISONE) cream Apply topically 2 (two) times daily.  . diclofenac sodium (VOLTAREN) 1 % GEL Apply 2 g topically 4 (four) times daily. To affected areas  . fluticasone (FLONASE) 50 MCG/ACT nasal spray Place 2 sprays into both nostrils daily.  . hydrocortisone (ANUSOL-HC) 25 MG suppository Place 25 mg rectally as needed. Reported on 04/19/2016  . hydrocortisone 2.5 % cream Apply topically as needed. Reported on 04/19/2016  . ipratropium (ATROVENT) 0.06 % nasal spray Place 2 sprays into both nostrils every 4 (four) hours as needed for rhinitis.  [name] losartan-hydrochlorothiazide (HYZAAR) 100-25 MG tablet Take 1 tablet by mouth daily.  . montelukast (SINGULAIR) 10 MG tablet TAKE 1 TABLET BY MOUTH AT BEDTIME.  [name] omeprazole (PRILOSEC) 40 MG capsule TAKE 1 CAPSULE BY MOUTH EVERY DAY  . tadalafil (CIALIS) 5 MG tablet Take 5 mg by mouth daily.  . tamsulosin (FLOMAX) 0.4 MG CAPS capsule Take 0.4 mg by mouth.  . terbinafine (LAMISIL) 1 % cream Apply 1 application topically 2 (two) times daily.  . traZODone (DESYREL) 50 MG tablet Take 50 mg by mouth at bedtime.  . valACYclovir (VALTREX) 500 MG tablet Take 1 tablet (500 mg total) by mouth daily.  . budesonide-formoterol (SYMBICORT) 80-4.5 MCG/ACT inhaler Inhale 2 puffs into the lungs 2 (two) times daily.   No  facility-administered encounter medications on file as of 03/10/2018.      Review of Systems  Constitutional:   No  weight loss, night sweats,  Fevers, chills, fatigue, or  lassitude.  HEENT:   No headaches,  Difficulty swallowing,  Tooth/dental problems, or  Sore throat, No sneezing, itching, ear ache, nasal congestion, post nasal drip   CV:  No chest pain,  Orthopnea, PND, swelling in lower extremities, anasarca, dizziness, palpitations, syncope.   GI  No heartburn, indigestion, abdominal pain, nausea, vomiting, diarrhea, change in bowel habits, loss of appetite, bloody stools.   Resp: +occasional shortness of breath with exercise or after exertion, typically relieved by rest and rescue inhaler,  No excess mucus, no productive cough,  No non-productive cough,  No coughing up of blood.  No change in color of mucus.  No wheezing.  No chest wall deformity  Skin: no rash or lesions.  GU: no dysuria, change  in color of urine, no urgency or frequency.  No flank pain, no hematuria   MS:  No joint pain or swelling.  No decreased range of motion.  No back pain.    Physical Exam  BP 132/78 (BP Location: Left Arm, Cuff Size: Normal)   Pulse 85   Ht 5' 7.5" (1.715 m)   Wt 201 lb 6.4 oz (91.4 kg)   SpO2 98%   BMI 31.08 kg/m   GEN: A/Ox3; pleasant , NAD, well nourished    HEENT:  [place]/AT,  EACs-clear, TMs-wnl, NOSE-clear, THROAT-clear, no lesions, no postnasal drip or exudate noted.   NECK:  Supple w/ fair ROM; no JVD; no thyromegaly or nodules palpated; no lymphadenopathy.    RESP  Clear  P & A; w/o, wheezes/ rales/ or rhonchi. no accessory muscle use, no dullness to percussion  CARD:  RRR, no m/r/g, no peripheral edema, pulses intact, no cyanosis or clubbing.  GI:   Soft & nt; nml bowel sounds; no organomegaly or masses detected.   Musco: Warm bil, no deformities or joint swelling noted.   Neuro: alert, no focal deficits noted.    Skin: Warm, no lesions or rashes    Lab  Results:  CBC    Component Value Date/Time   WBC 5.9 02/20/2018 1045   WBC 8.3 09/07/2014 1607   RBC 5.75 02/20/2018 1045   RBC 5.94 (H) 09/07/2014 1607   HGB 13.3 02/20/2018 1045   HCT 40.8 02/20/2018 1045   PLT 177 09/07/2014 1607   MCV 71 (L) 02/20/2018 1045   MCH 23.1 (L) 02/20/2018 1045   MCH 24.9 (L) 09/07/2014 1607   MCHC 32.6 02/20/2018 1045   MCHC 33.3 09/07/2014 1607   RDW 17.8 (H) 02/20/2018 1045   LYMPHSABS 2.0 02/20/2018 1045   MONOABS 0.7 09/07/2014 1607   EOSABS 0.1 02/20/2018 1045   BASOSABS 0.1 02/20/2018 1045    BMET    Component Value Date/Time   NA 139 02/20/2018 1045   K 4.0 02/20/2018 1045   CL 100 02/20/2018 1045   CO2 23 02/20/2018 1045   GLUCOSE 119 (H) 02/20/2018 1045   GLUCOSE 107 (H) 01/30/2018 1120   BUN 13 02/20/2018 1045   CREATININE 1.17 02/20/2018 1045   CREATININE 1.11 01/30/2018 1120   CALCIUM 9.9 02/20/2018 1045   GFRNONAA 64 02/20/2018 1045   GFRNONAA 68 01/30/2018 1120   GFRAA 74 02/20/2018 1045   GFRAA 79 01/30/2018 1120    BNP No results found for: BNP  ProBNP No results found for: PROBNP  Imaging: Ct Chest W Contrast  Result Date: 03/07/2018 CLINICAL DATA:  Persistent cough.  Dyspnea.  Former smoker. EXAM: CT CHEST WITH CONTRAST TECHNIQUE: Multidetector CT imaging of the chest was performed during intravenous contrast administration. CONTRAST:  49m ISOVUE-300 IOPAMIDOL (ISOVUE-300) INJECTION 61% COMPARISON:  04/19/2016 chest radiograph. FINDINGS: Cardiovascular: Normal heart size. No significant pericardial effusion/thickening. Atherosclerotic nonaneurysmal thoracic aorta. Normal caliber pulmonary arteries. No central pulmonary emboli. Mediastinum/Nodes: No discrete thyroid nodules. Unremarkable esophagus. No pathologically enlarged axillary, mediastinal or hilar lymph nodes. Lungs/Pleura: No pneumothorax. No pleural effusion. No acute consolidative airspace disease or lung masses. There are 3 solid subpleural pulmonary  nodules scattered in the lungs bilaterally, largest 6 mm in the right middle lobe (series 3/image 98). No significant regions of bronchiectasis. Upper abdomen: Small hiatal hernia. Hypodense 1.7 cm exophytic renal cortical lesion in the posterior upper left kidney with density 37 HU (series 2/image 159). Partially visualized simple 1.2 cm anterior upper left  renal cyst. Musculoskeletal: No aggressive appearing focal osseous lesions. Mild thoracic spondylosis. IMPRESSION: 1. A few solid subpleural pulmonary nodules scattered in the lungs bilaterally, largest 6 mm in the right middle lobe. Non-contrast chest CT at 3-6 months is recommended. If the nodules are stable at time of repeat CT, then future CT at 18-24 months (from today's scan) is considered optional for low-risk patients, but is recommended for high-risk patients. This recommendation follows the consensus statement: Guidelines for Management of Incidental Pulmonary Nodules Detected on CT Images: From the Fleischner Society 2017; Radiology 2017; 284:228-243. 2. Indeterminate exophytic 1.7 cm hypodense renal cortical lesion in the posterior upper left kidney, differential includes hemorrhagic/proteinaceous renal cyst versus renal cell carcinoma. Dedicated MRI (preferred) or CT abdomen without and with IV contrast is recommended for further characterization. 3. Small hiatal hernia. Aortic Atherosclerosis (ICD10-I70.0). These results will be called to the ordering clinician or representative by the Radiologist Assistant, and communication documented in the PACS or zVision Dashboard. Electronically Signed   By: Ilona Sorrel M.D.   On: 03/07/2018 12:54     Assessment & Plan:   OSA on CPAP Keep up the hard work using your CPAP Follow-up with Dr. Halford Chessman in 3 months >>>Keep sleep diary documenting sleep events observed by spouse Continue close follow-up with office  >>>especially if you have any changes in your breathing, sleep, or issues using  CPAP  Thank you for completing your pulmonary function test today.  Great work using your CPAP.  [facility]  Contact the office whenever the [facility] switch her [facility]AP to BiPAP. >>>We will contact the [facility] to try to get results of your sleep study   GERD (gastroesophageal reflux disease) Continue Nexium Continue to make dietary changes avoiding spicy food, coffee, sodas, late-night meals Follow-up with primary care regarding GERD symptoms that they do not improve.  Asthma Mild small airway obstruction on spirometry, no significant bronchodilator response, mild restriction  Follow-up with Dr. Halford Chessman in 3 month Thank you for completing your pulmonary function test today.    Restart and use your Symbicort 2 puffs twice a day no matter what >>>Sample provided today  Use your albuterol rescue inhaler as needed for shortness of breath   Pulmonary nodule Repeat CT in 6 months Follow-up with Dr. Halford Chessman in 3 months   This appointment was 45 minutes long, with over 50% of the in direct face-to-face patient care, assessment, review of recent test, results of test, and discussing plan of care.   Lauraine Rinne, NP 03/10/2018

## 2018-03-10 NOTE — Telephone Encounter (Signed)
ONO with RA 03/06/18 >> test time 7 hrs 25 min.  Average SpO2 96%, low SpO2 92%.     Please let him know his ONO looked okay.

## 2018-03-10 NOTE — Progress Notes (Signed)
PFT done today. 

## 2018-03-11 NOTE — Telephone Encounter (Signed)
Called and spoke with patient regarding results.  Informed the patient of results and recommendations today. Pt verbalized understanding and denied any questions or concerns at this time.  Nothing further needed.  

## 2018-03-11 NOTE — Progress Notes (Signed)
Reviewed and agree with assessment/plan.   Eleonor Ocon, MD [facility] 10/17/2016, 12:24 PM Pager:  [phone]  

## 2018-04-04 NOTE — Telephone Encounter (Signed)
Will close encounter, as CT was ordered during 03/10/18 OV. Nothing further is needed.

## 2018-04-11 DIAGNOSIS — N401 Enlarged prostate with lower urinary tract symptoms: Secondary | ICD-10-CM | POA: Diagnosis not present

## 2018-04-11 DIAGNOSIS — N138 Other obstructive and reflux uropathy: Secondary | ICD-10-CM | POA: Diagnosis not present

## 2018-04-11 DIAGNOSIS — N5201 Erectile dysfunction due to arterial insufficiency: Secondary | ICD-10-CM | POA: Diagnosis not present

## 2018-04-11 DIAGNOSIS — R3911 Hesitancy of micturition: Secondary | ICD-10-CM | POA: Diagnosis not present

## 2018-05-05 ENCOUNTER — Ambulatory Visit (INDEPENDENT_AMBULATORY_CARE_PROVIDER_SITE_OTHER): Payer: Medicare Other | Admitting: Pulmonary Disease

## 2018-05-05 ENCOUNTER — Encounter: Payer: Self-pay | Admitting: Pulmonary Disease

## 2018-05-05 VITALS — BP 122/80 | HR 65 | Ht 67.0 in | Wt 204.0 lb

## 2018-05-05 DIAGNOSIS — G4733 Obstructive sleep apnea (adult) (pediatric): Secondary | ICD-10-CM

## 2018-05-05 DIAGNOSIS — R911 Solitary pulmonary nodule: Secondary | ICD-10-CM

## 2018-05-05 DIAGNOSIS — R918 Other nonspecific abnormal finding of lung field: Secondary | ICD-10-CM | POA: Diagnosis not present

## 2018-05-05 DIAGNOSIS — J4520 Mild intermittent asthma, uncomplicated: Secondary | ICD-10-CM | POA: Diagnosis not present

## 2018-05-05 DIAGNOSIS — R05 Cough: Secondary | ICD-10-CM

## 2018-05-05 DIAGNOSIS — Z9989 Dependence on other enabling machines and devices: Secondary | ICD-10-CM | POA: Diagnosis not present

## 2018-05-05 DIAGNOSIS — R058 Other specified cough: Secondary | ICD-10-CM

## 2018-05-05 NOTE — Patient Instructions (Signed)
Can try gradually decreasing how much symbicort you use  CT chest schedule for November 2019 and follow up in office after this

## 2018-05-05 NOTE — Progress Notes (Signed)
[facility]  Chief Complaint  Patient presents with  . Follow-up    Pt had no issues at this time with cpap machine. doing well overall.    Vital signs: BP 122/80 (BP Location: Left Arm, Cuff Size: Normal)   Pulse 65   Ht 5\' 7"  (1.702 m)   Wt 204 lb (92.5 kg)   SpO2 100%   BMI 31.95 kg/m   History of Present Illness: Brandon [name] is a 67 y.o. male former smoker with a mild intermittent asthma, upper airway cough, OSA, and lung nodule.  Since his last visit he had labs, CT chest, PFT.  Labs normal.  CT chest showed scattered nodules.  PFT showed mild obstruction with small airway disease.    He has deviated septum.  Has sinus congestion with post nasal drip.  This triggers a cough.  He isn't having chest congestion, wheeze, or hemoptysis.  Has been using symbicort, but he isn't sure this has helped.  He thinks he was told he had spot in his kidney at the [place].  Physical Exam:  Appearance - well kempt  ENMT - nasal mucosa moist, turbinates clear, deviated nasal septum, no dental lesions, no gingival bleeding, no oral exudates, no tonsillar hypertrophy Neck - no masses, trachea midline, no thyromegaly, no elevation in JVP Respiratory - normal appearance of chest wall, normal respiratory effort w/o accessory muscle use, no dullness on percussion, no tactile fremitus, no wheezing or rales CV - s1s2 regular rate and rhythm, no murmurs, no peripheral edema, no varicosities, radial pulses symmetric GI - soft, non tender, no masses, no hepatosplenomegaly Lymph - no adenopathy noted in neck and axillary areas MSK - normal muscle strength and tone, normal gait Ext - no cyanosis, clubbing, or joint inflammation noted Skin - no rashes, lesions, or ulcers Neuro - oriented to person, place, and time Psych - normal mood and affect  Assessment/Plan:  Mild, intermittent asthma. - will gradually decrease symbicort as able - continue singulair  and prn albuterol  Upper airway cough. - he has deviated nasal septum - continue flonase, nasal irrigation, singulair - he can try OCT antihistamine - might need f/u with ENT at [facility]  Lung nodule with hx of smoking. - f/u CT chest w/o contrast in November 2019  Exophytic lesion on left kidney. - [facility] whether [facility] had [facility] of this  Obstructive sleep apnea. - he is compliant with CPAP and reports benefit - continue CPAP 12 cm H2O  Intermittent, rapid breathing pattern while asleep. - this likely represents tachypnea during REM sleep   Patient Instructions  Can try gradually decreasing how much symbicort you use  CT chest schedule for November 2019 and follow up in office after this    Chesley Mires, MD [place] 05/05/2018, 11:22 AM  Flow Sheet  Pulmonary tests: CT chest 01/18/17 >> calcified granulomas, 5 mm nodule LUL, 5 mm nodule LLL, centrilobular nodularity RML Spirometry 02/14/18 >> FVC 70%, FEV1 73%, FEV1% 77 RAST 02/20/18 >> negative, IgE 15 CT chest 03/07/18 >> scattered nodules up to 6 mm PFT 03/10/18 >> FEV1 2.47 (93%), FEV1% 83, TLC 5.15 (79%), DLCO 71%, no BD  Sleep tests: CPAP 12/18/17 to 01/16/18 >> used on 25 of 30 nights with average 7 hrs 6 min.  Average AHI 6.4 with median CPAP 12 and 95 th percentile CPAP 15 cm H2O. ONO with RA 03/06/18 >> test time 7 hrs 25  min.  Average SpO2 96%, low SpO2 92%.     Past Medical History: He  has a past medical history of ED (erectile dysfunction), GERD (gastroesophageal reflux disease), Hepatitis C, Hyperlipidemia, Hypertension, and Nose fracture.  Past Surgical History: He  has a past surgical history that includes Foot surgery (Right); Foot surgery (Left); and Inguinal hernia repair (Right).  Family History: His family history includes Breast cancer in his sister; Depression in his sister; Hyperlipidemia in his unknown relative; Hypertension in his unknown  relative; Ovarian cancer in his sister.  Social History: He  reports that he quit smoking about 21 years ago. His smoking use included cigarettes. He has a 22.50 pack-year smoking history. He has never used smokeless tobacco. He reports that he does not drink alcohol or use drugs.  Medications: Allergies as of 05/05/2018   No Known Allergies     Medication List        Accurate as of 05/05/18 11:22 AM. Always use your most recent med list.          albuterol 108 (90 Base) MCG/ACT inhaler Commonly known as:  PROAIR HFA Inhale 2 puffs into the lungs every 6 (six) hours as needed for wheezing or shortness of breath.   atorvastatin 20 MG tablet Commonly known as:  LIPITOR Take 1 tablet (20 mg total) by mouth daily.   baclofen 10 MG tablet Commonly known as:  LIORESAL Take 1 tablet (10 mg total) by mouth every 8 (eight) hours as needed (migraine (may cause drowsiness)).   budesonide-formoterol 80-4.5 MCG/ACT inhaler Commonly known as:  SYMBICORT Inhale 2 puffs into the lungs 2 (two) times daily.   budesonide-formoterol 80-4.5 MCG/ACT inhaler Commonly known as:  SYMBICORT Inhale 2 puffs into the lungs 2 (two) times daily.   clindamycin 1 % lotion Commonly known as:  CLEOCIN T Apply topically 2 (two) times daily.   clindamycin 1 % Swab Commonly known as:  CLEOCIN T   clotrimazole-betamethasone cream Commonly known as:  LOTRISONE Apply topically 2 (two) times daily.   diclofenac sodium 1 % Gel Commonly known as:  VOLTAREN Apply 2 g topically 4 (four) times daily. To affected areas   fluticasone 50 MCG/ACT nasal spray Commonly known as:  FLONASE Place 2 sprays into both nostrils daily.   hydrocortisone 2.5 % cream Apply topically as needed. Reported on 04/19/2016   hydrocortisone 25 MG suppository Commonly known as:  ANUSOL-HC Place 25 mg rectally as needed. Reported on 04/19/2016   ipratropium 0.06 % nasal spray Commonly known as:  ATROVENT Place 2 sprays into  both nostrils every 4 (four) hours as needed for rhinitis.   losartan-hydrochlorothiazide 100-25 MG tablet Commonly known as:  HYZAAR Take 1 tablet by mouth daily.   montelukast 10 MG tablet Commonly known as:  SINGULAIR TAKE 1 TABLET BY MOUTH AT BEDTIME.   omeprazole 40 MG capsule Commonly known as:  PRILOSEC TAKE 1 CAPSULE BY MOUTH EVERY DAY   tadalafil 5 MG tablet Commonly known as:  CIALIS Take 5 mg by mouth daily.   tamsulosin 0.4 MG Caps capsule Commonly known as:  FLOMAX Take 0.4 mg by mouth.   terbinafine 1 % cream Commonly known as:  LAMISIL Apply 1 application topically 2 (two) times daily.   traZODone 50 MG tablet Commonly known as:  DESYREL Take 50 mg by mouth at bedtime.   valACYclovir 500 MG tablet Commonly known as:  VALTREX Take 1 tablet (500 mg total) by mouth daily.

## 2018-05-11 ENCOUNTER — Other Ambulatory Visit: Payer: Self-pay | Admitting: Family Medicine

## 2018-05-21 ENCOUNTER — Encounter: Payer: Self-pay | Admitting: Pulmonary Disease

## 2018-05-22 ENCOUNTER — Other Ambulatory Visit: Payer: Self-pay | Admitting: Physician Assistant

## 2018-05-22 DIAGNOSIS — A6000 Herpesviral infection of urogenital system, unspecified: Secondary | ICD-10-CM

## 2018-06-05 IMAGING — DX DG CHEST 2V
2 series · 2 of 2 positions shown · non-contrast
Comparison: Chest x-ray of October 06, 2014

CLINICAL DATA: Three weeks of cough common no or leak with
medication, history of asthma -bronchitis, former smoker.

EXAM:
CHEST  2 VIEW

[chest pa]
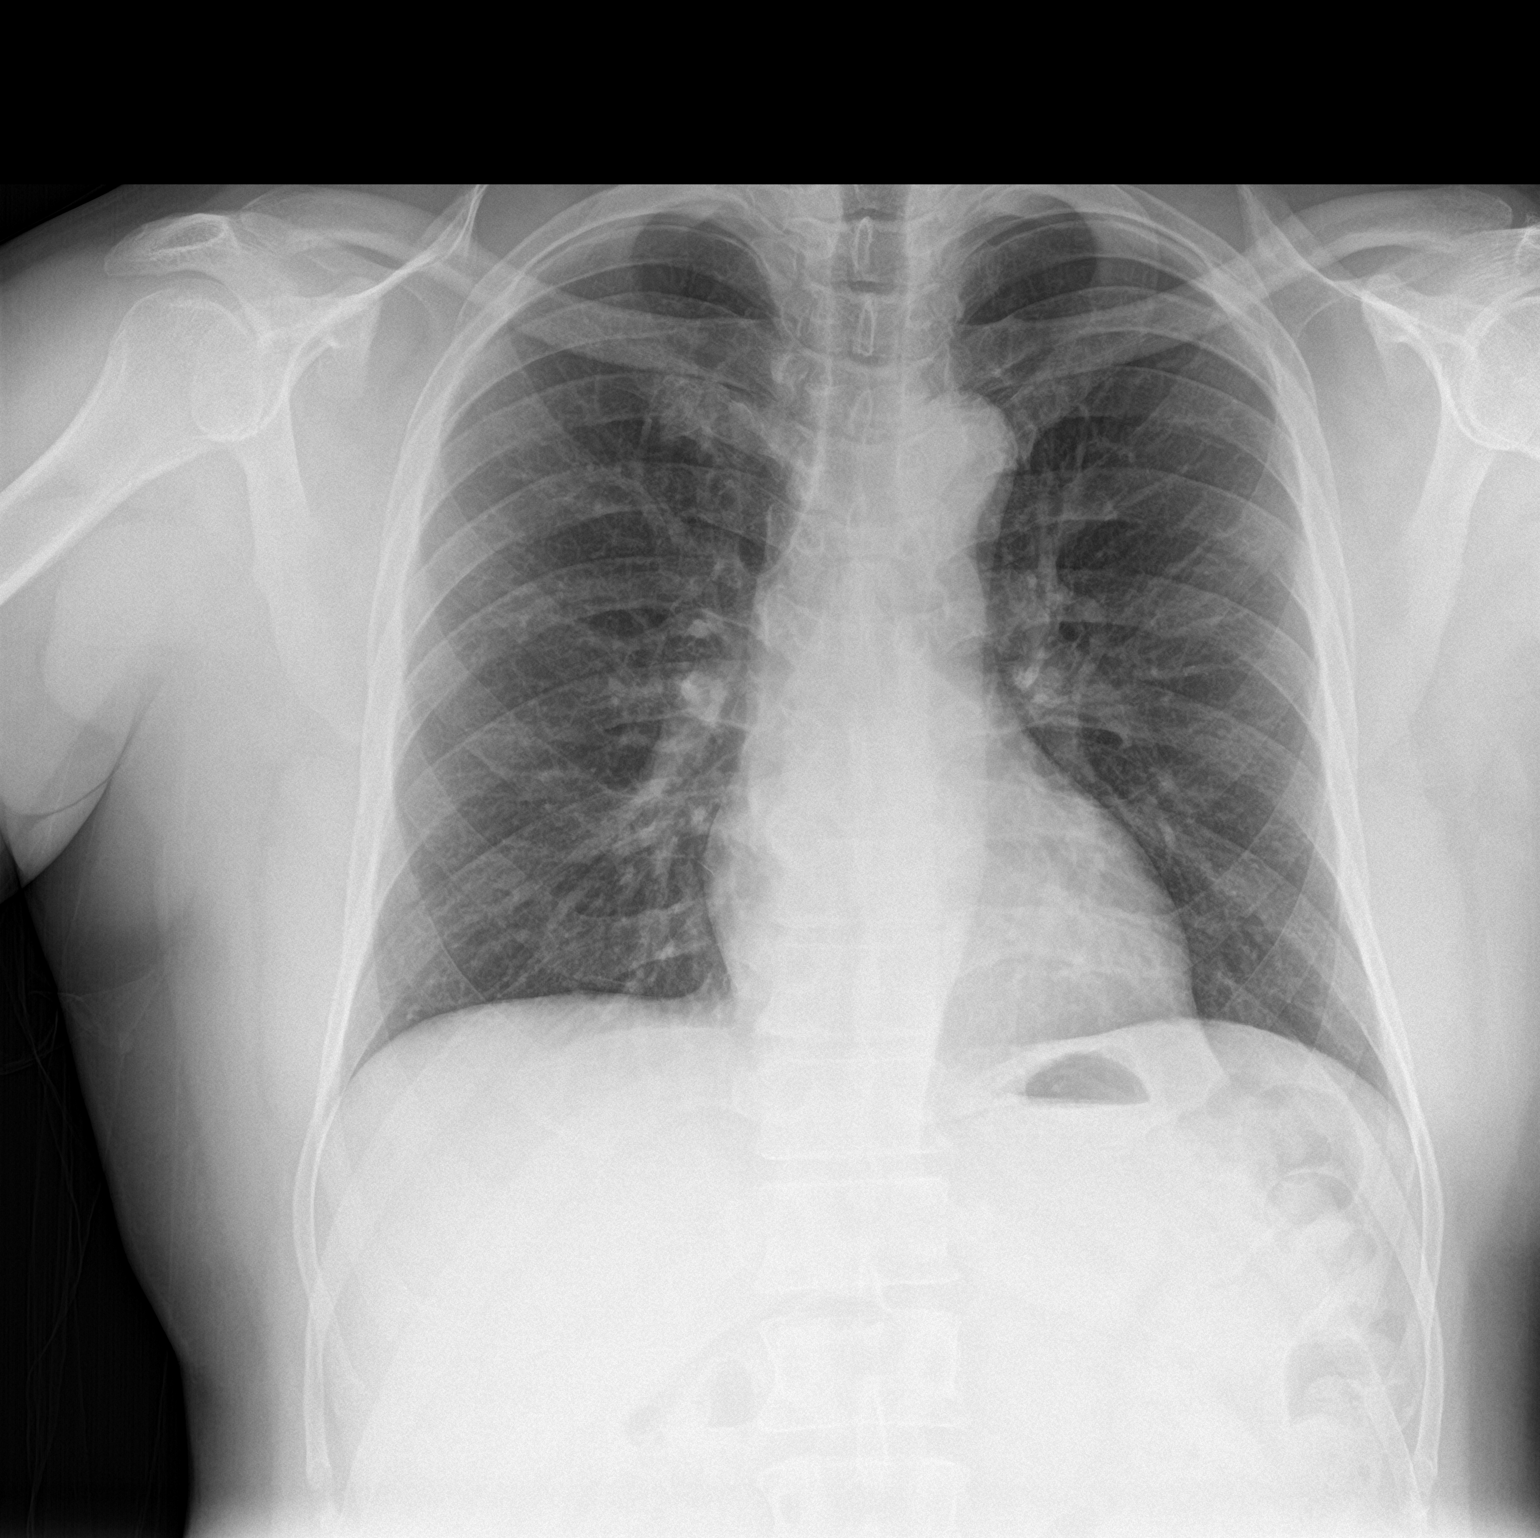

[chest lat]
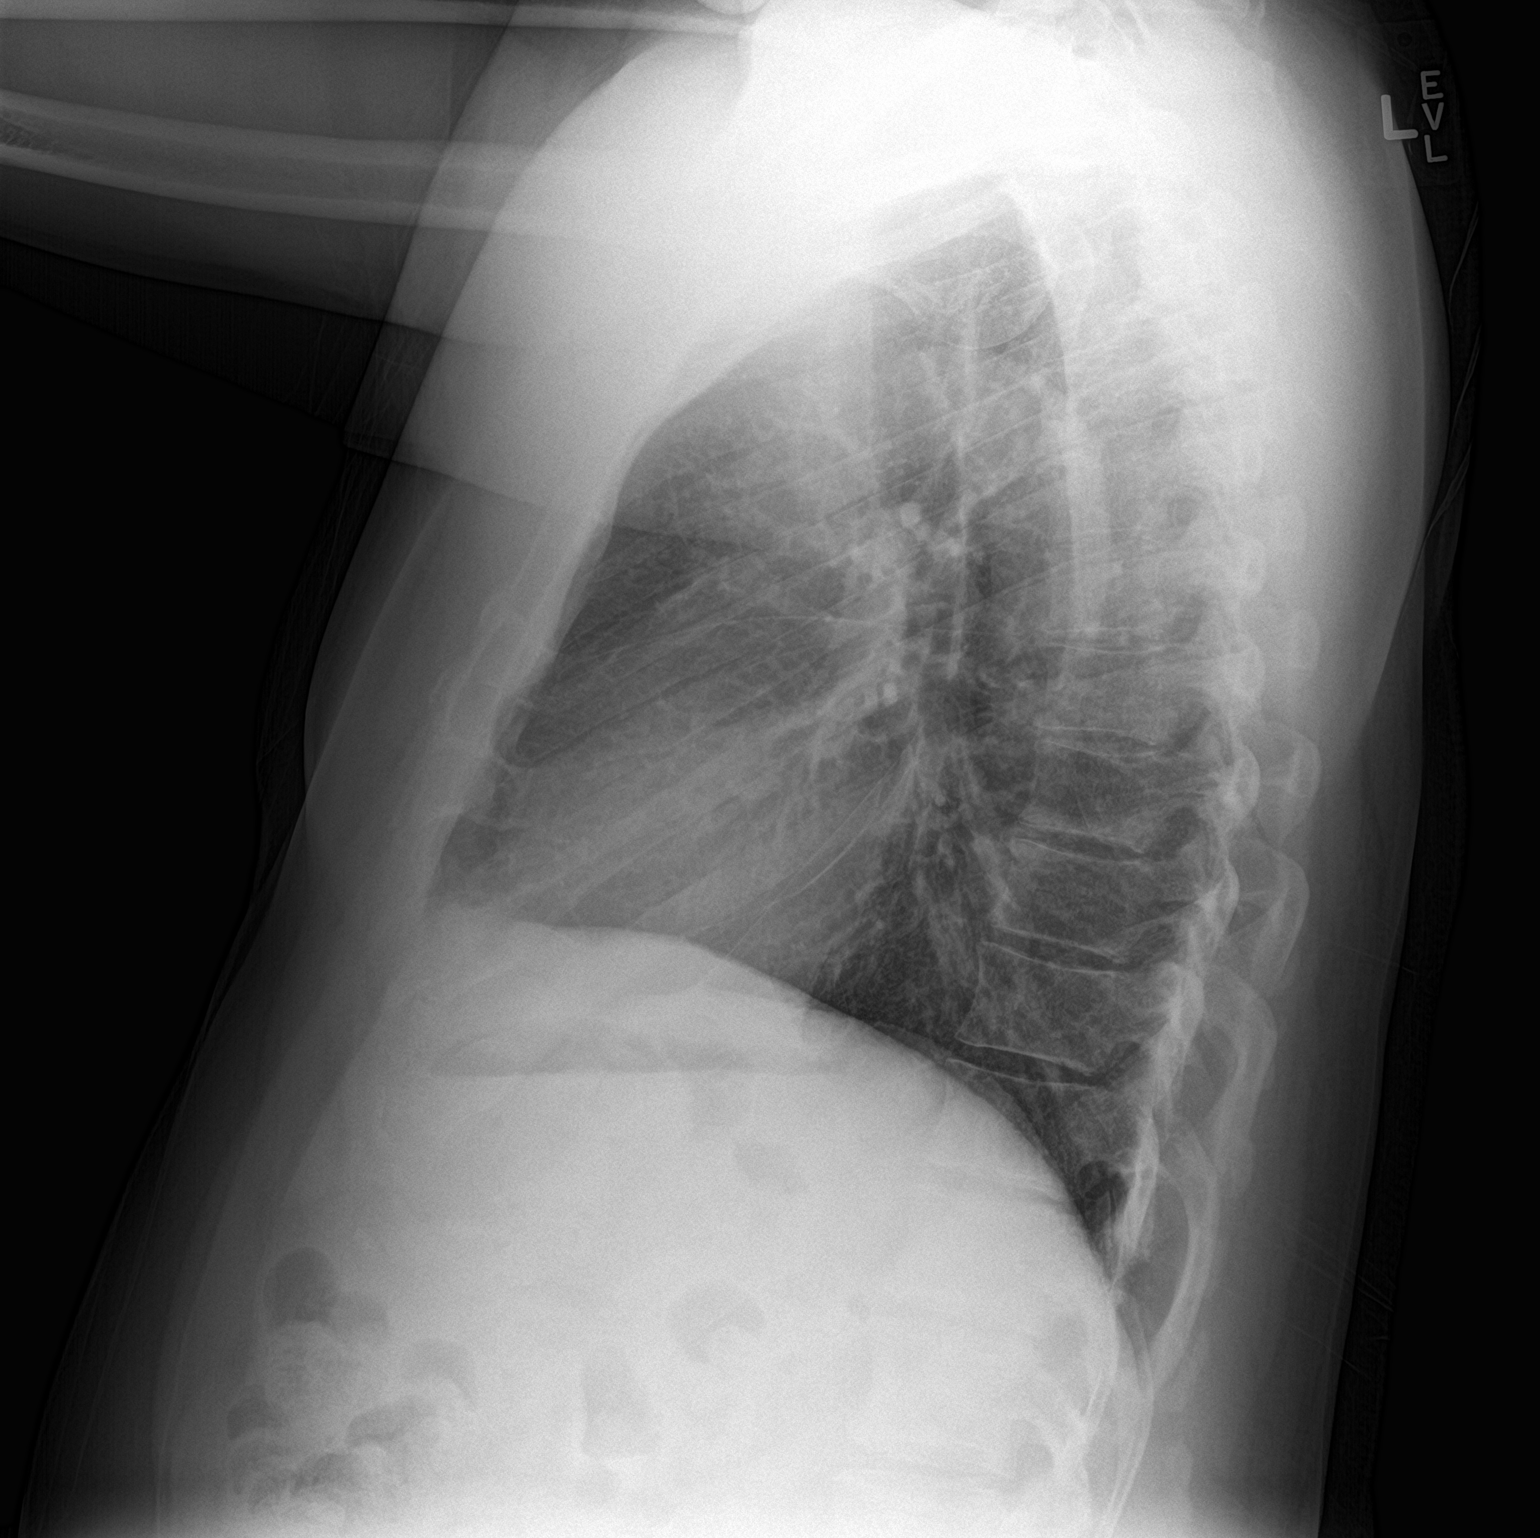

[2 of 2 positions shown; findings below may reference images not displayed]

FINDINGS: The lungs are adequately inflated. The interstitial markings are
coarse bilaterally but not greatly changed. The heart and pulmonary
vascularity are normal. The mediastinum is normal in width. There is
no pleural effusion. The bony thorax is unremarkable.
IMPRESSION: Chronic bronchitic changes. There is no evidence of pneumonia, CHF,
nor other acute cardiopulmonary abnormality.

## 2018-06-09 ENCOUNTER — Ambulatory Visit: Payer: No Typology Code available for payment source | Admitting: Pulmonary Disease

## 2018-06-18 ENCOUNTER — Other Ambulatory Visit: Payer: Self-pay | Admitting: Family Medicine

## 2018-06-18 DIAGNOSIS — G43701 Chronic migraine without aura, not intractable, with status migrainosus: Secondary | ICD-10-CM

## 2018-08-08 ENCOUNTER — Encounter: Payer: Self-pay | Admitting: Emergency Medicine

## 2018-08-08 ENCOUNTER — Emergency Department (INDEPENDENT_AMBULATORY_CARE_PROVIDER_SITE_OTHER)
Admission: EM | Admit: 2018-08-08 | Discharge: 2018-08-08 | Disposition: A | Discharge status: Home / Self Care | Payer: Medicare Other | Source: Home / Self Care | Attending: Family Medicine | Admitting: Family Medicine

## 2018-08-08 DIAGNOSIS — R350 Frequency of micturition: Secondary | ICD-10-CM

## 2018-08-08 LAB — POCT CBC W AUTO DIFF (K'VILLE URGENT CARE)

## 2018-08-08 LAB — POCT URINALYSIS DIP (MANUAL ENTRY)
Bilirubin, UA: NEGATIVE
Blood, UA: NEGATIVE
Glucose, UA: NEGATIVE mg/dL
Ketones, POC UA: NEGATIVE mg/dL
Leukocytes, UA: NEGATIVE
Nitrite, UA: NEGATIVE
Protein Ur, POC: NEGATIVE mg/dL
Spec Grav, UA: 1.015 (ref 1.010–1.025)
Urobilinogen, UA: 0.2 E.U./dL
pH, UA: 6.5 (ref 5.0–8.0)

## 2018-08-08 NOTE — ED Provider Notes (Signed)
[name] C[facility]    CSN: [id number] Arrival date & time: 08/08/18  2951     History   Chief Complaint Chief Complaint  Patient presents with  . Urinary Frequency    HPI Brandon [name] is a 67 y.o. male.   HPI Brandon [name] is a 67 y.o. male presenting to UC with c/o urinary frequency and urgency for 1 week.  Hx of BPH. He has been taking his Flomax as prescribed.  Mild pressure in his perineum and lower abdomen but denies pain.  Denies fever, chills, n/v/d. Denies hematuria.    Past Medical History:  Diagnosis Date  . ED (erectile dysfunction)   . GERD (gastroesophageal reflux disease)   . Hepatitis C    [phone], treated at [facility]  . Hyperlipidemia   . Hypertension   . Nose fracture     Patient Active Problem List   Diagnosis Date Noted  . Asthma 03/10/2018  . Primary osteoarthritis of right hand 01/30/2018  . Migraine with aura and with status migrainosus, not intractable 11/21/2017  . Recurrent genital herpes simplex 11/21/2017  . Eosinophilic esophagitis 16/10/930  . Lumbar spondylosis with myelopathy 06/14/2017  . OSA on CPAP 05/16/2017  . Hearing loss 01/31/2017  . Pulmonary nodule 01/31/2017  . Kidney lesion, native, left 01/31/2017  . Depression, recurrent ([place]) 01/31/2017  . Chronic cough 04/19/2016  . Insomnia 11/18/2014  . Gout 12/03/2013  . Onychomycosis 12/03/2013  . B12 deficiency 12/03/2013  . Unspecified vitamin D deficiency 12/03/2013  . Liver fibrosis 12/03/2013  . Hemorrhoids 11/23/2013  . Essential hypertension, benign 10/29/2013  . History of hepatitis C virus infection 10/29/2013  . GERD (gastroesophageal reflux disease) 10/29/2013  . Hyperlipidemia 10/29/2013  . BPH (benign prostatic hyperplasia) 10/29/2013  . ED (erectile dysfunction) 10/29/2013  . Hemorrhoid 10/29/2013  . HSV antigen DIF positive 11/27/2011  . Sciatic neuropathy 08/10/2011  . Pain of lumbosacral spine 08/10/2011  . Hepatitis C antibody  test positive 08/10/2011    Past Surgical History:  Procedure Laterality Date  . FOOT SURGERY Right    1980  . FOOT SURGERY Left    1980  . INGUINAL HERNIA REPAIR Right    2009       Home Medications    Prior to Admission medications   Medication Sig Start Date End Date Taking? Authorizing Provider  albuterol (PROAIR HFA) 108 (90 Base) MCG/ACT inhaler Inhale 2 puffs into the lungs every 6 (six) hours as needed for wheezing or shortness of breath. 01/30/18   Hali Marry, MD  atorvastatin (LIPITOR) 20 MG tablet Take 1 tablet (20 mg total) by mouth daily. 11/26/17   Hali Marry, MD  baclofen (LIORESAL) 10 MG tablet Take 1 tablet (10 mg total) by mouth every 8 (eight) hours as needed (migraine (may cause drowsiness)). 11/21/17   Trixie Dredge, PA-C  budesonide-formoterol [facility]) 80-4.5 MCG/ACT inhaler Inhale 2 puffs into the lungs 2 (two) times daily.    [provider]  budesonide-formoterol (SYMBICORT) 80-4.5 MCG/ACT inhaler Inhale 2 puffs into the lungs 2 (two) times daily. 03/10/18   Lauraine Rinne, NP  clindamycin (CLEOCIN T) 1 % lotion Apply topically 2 (two) times daily.    [provider]  clindamycin (CLEOCIN T) 1 % SWAB  04/17/16   [provider]  clotrimazole-betamethasone (LOTRISONE) cream Apply topically 2 (two) times daily. 05/16/17   Hali Marry, MD  diclofenac sodium (VOLTAREN) 1 % GEL Apply 2 g topically 4 (four) times daily.  To affected areas 01/30/18   Hali Marry, MD  fluticasone [facility]) 50 MCG/ACT nasal spray Place 2 sprays into both nostrils daily. 04/19/16   Gregor Hams, MD  hydrocortisone (ANUSOL-HC) 25 MG suppository Place 25 mg rectally as needed. Reported on 04/19/2016    [provider]  hydrocortisone 2.5 % cream Apply topically as needed. Reported on 04/19/2016    [provider]  ipratropium (ATROVENT) 0.06 % nasal spray Place 2 sprays into both nostrils every 4  (four) hours as needed for rhinitis. 04/19/16   Gregor Hams, MD  losartan-hydrochlorothiazide (HYZAAR) 100-25 MG tablet Take 1 tablet by mouth daily. 08/02/17   Hali Marry, MD  montelukast (SINGULAIR) 10 MG tablet TAKE 1 TABLET BY MOUTH AT BEDTIME. 05/12/18   Hali Marry, MD  omeprazole (PRILOSEC) 40 MG capsule TAKE 1 CAPSULE BY MOUTH EVERY DAY 11/26/17   Hali Marry, MD  tadalafil (CIALIS) 5 MG tablet Take 5 mg by mouth daily. 11/11/17   [provider]  tamsulosin (FLOMAX) 0.4 MG CAPS capsule Take 0.4 mg by mouth.    [provider]  terbinafine (LAMISIL) 1 % cream Apply 1 application topically 2 (two) times daily.    [provider]  traZODone (DESYREL) 50 MG tablet Take 50 mg by mouth at bedtime.    [provider]  valACYclovir (VALTREX) 500 MG tablet TAKE 1 TABLET BY MOUTH EVERY DAY 05/22/18   Hali Marry, MD    Family History Family History  Problem Relation Age of Onset  . Breast cancer Sister   . Depression Sister   . Hyperlipidemia Unknown   . Ovarian cancer Sister   . Hypertension Unknown     Social History Social History   Tobacco Use  . Smoking status: Former Smoker    Packs/day: 1.50    Years: 15.00    Pack years: 22.50    Types: Cigarettes    Last attempt to quit: 10/22/1996    Years since quitting: 21.8  . Smokeless tobacco: Never Used  Substance Use Topics  . Alcohol use: No  . Drug use: No     Allergies   Patient has no known allergies.   Review of Systems Review of Systems  Constitutional: Negative for chills and fever.  Gastrointestinal: Positive for abdominal pain. Negative for diarrhea, nausea and vomiting.  Genitourinary: Positive for frequency and urgency. Negative for discharge, dysuria, flank pain, penile swelling, scrotal swelling and testicular pain.  Musculoskeletal: Negative for back pain and myalgias.     Physical Exam Triage Vital Signs ED Triage Vitals [08/08/18  1059]  Enc Vitals Group     BP (!) 154/79     Pulse Rate 60     Resp      Temp 97.8 F (36.6 C)     Temp Source Oral     SpO2 99 %     Weight 194 lb (88 kg)     Height      Head Circumference      Peak Flow      Pain Score 0     Pain Loc      Pain Edu?      Excl. in [place]?    No data found.  Updated Vital Signs BP (!) 154/79 (BP Location: Right Arm)   Pulse 60   Temp 97.8 F (36.6 C) (Oral)   Wt 194 lb (88 kg)   SpO2 99%   BMI 30.38 kg/m   Visual  Acuity Right Eye Distance:   Left Eye Distance:   Bilateral Distance:    Right Eye Near:   Left Eye Near:    Bilateral Near:     Physical Exam  Constitutional: He is oriented to person, place, and time. He appears well-developed and well-nourished. No distress.  HENT:  Head: Normocephalic and atraumatic.  Mouth/Throat: Oropharynx is clear and moist.  Eyes: EOM are normal.  Neck: Normal range of motion.  Cardiovascular: Normal rate and regular rhythm.  Pulmonary/Chest: Effort normal and breath sounds normal. No respiratory distress.  Abdominal: Soft. He exhibits no distension. There is no tenderness. There is no CVA tenderness.  Musculoskeletal: Normal range of motion.  Neurological: He is alert and oriented to person, place, and time.  Skin: Skin is warm and dry. He is not diaphoretic.  Psychiatric: He has a normal mood and affect. His behavior is normal.  Nursing note and vitals reviewed.    UC Treatments / Results  Labs (all labs ordered are listed, but only abnormal results are displayed) Labs Reviewed  BASIC METABOLIC PANEL  PSA  POCT URINALYSIS DIP (MANUAL ENTRY)  POCT CBC W AUTO DIFF ([place])    EKG None  Radiology No results found.  Procedures Procedures (including critical care time)  Medications Ordered in UC Medications - No data to display  Initial Impression / Assessment and Plan / UC Course  I have reviewed the triage vital signs and the nursing notes.  Pertinent labs &  imaging results that were available during my care of the patient were reviewed by me and considered in my medical decision making (see chart for details).     UA: unremarkable CBC: unremarkable PSA: pending Encouraged f/u with PCP and/or urology  Final Clinical Impressions(s) / UC Diagnoses   Final diagnoses:  Urinary frequency     Discharge Instructions      Please call to schedule a follow up appointment with your urologist or primary care provider for further evaluation and treatment of your symptoms. You will be notified once the PSA results come back.     ED Prescriptions    None     Controlled Substance Prescriptions [place] Controlled Substance Registry consulted? Not Applicable   Tyrell Antonio 08/08/18 [id number]

## 2018-08-08 NOTE — Discharge Instructions (Signed)
°  Please call to schedule a follow up appointment with your urologist or primary care provider for further evaluation and treatment of your symptoms. You will be notified once the PSA results come back.

## 2018-08-08 NOTE — ED Triage Notes (Signed)
Pt c/o urinary frequency and urgency x1 week. Denies pain.

## 2018-08-09 LAB — BASIC METABOLIC PANEL
BUN: 16 mg/dL (ref 7–25)
CO2: 28 mmol/L (ref 20–32)
Calcium: 10 mg/dL (ref 8.6–10.3)
Chloride: 100 mmol/L (ref 98–110)
Creat: 1.22 mg/dL (ref 0.70–1.25)
Glucose, Bld: 103 mg/dL — ABNORMAL HIGH (ref 65–99)
Potassium: 4.1 mmol/L (ref 3.5–5.3)
Sodium: 138 mmol/L (ref 135–146)

## 2018-08-09 LAB — PSA: PSA: 0.6 ng/mL (ref <=4.0)

## 2018-08-10 ENCOUNTER — Telehealth: Payer: Self-pay | Admitting: Emergency Medicine

## 2018-08-10 NOTE — Telephone Encounter (Signed)
Patient informed of results.  Patient will follow up with his PCP regarding results.

## 2018-08-21 ENCOUNTER — Ambulatory Visit: Payer: No Typology Code available for payment source | Admitting: Family Medicine

## 2018-08-29 ENCOUNTER — Other Ambulatory Visit: Payer: Self-pay | Admitting: Family Medicine

## 2018-08-29 DIAGNOSIS — I10 Essential (primary) hypertension: Secondary | ICD-10-CM

## 2018-09-09 ENCOUNTER — Other Ambulatory Visit: Payer: Self-pay

## 2018-09-10 ENCOUNTER — Ambulatory Visit (INDEPENDENT_AMBULATORY_CARE_PROVIDER_SITE_OTHER)
Admission: RE | Admit: 2018-09-10 | Discharge: 2018-09-10 | Disposition: A | Discharge status: Home / Self Care | Payer: Medicare Other | Source: Ambulatory Visit | Attending: Pulmonary Disease | Admitting: Pulmonary Disease

## 2018-09-10 DIAGNOSIS — R05 Cough: Secondary | ICD-10-CM

## 2018-09-10 DIAGNOSIS — R059 Cough, unspecified: Secondary | ICD-10-CM

## 2018-09-10 DIAGNOSIS — R918 Other nonspecific abnormal finding of lung field: Secondary | ICD-10-CM | POA: Diagnosis not present

## 2018-09-11 ENCOUNTER — Telehealth: Payer: Self-pay | Admitting: Pulmonary Disease

## 2018-09-11 NOTE — Telephone Encounter (Signed)
Notes recorded by Lauraine Rinne, NP on 09/11/2018 at 8:18 AM EST CT chest without contrast showing stable pulmonary nodules. In the right middle lobe and left lower lobe. They have been stable over the last 37-month interval. We can follow this with a CT without contrast in 12 months. Due to your previous smoking history. There was an indeterminate density round lesion on your left kidney which radiology is recommending MRI. I will forward these results to your primary care doctor so they can order this test and follow this with you.  See radiology impression below:   1. Stable subpleural nodules in RIGHT middle lobe and LEFT lower lobe. Nodule stable over six-month interval. If patient patient is high risk for carcinoma, recommend follow-up CT in 12 months from current month per Fleischner criteria. 2. Indeterminate density round lesion of the upper pole of the LEFT kidney. Recommend MRI with without contrast for further evaluation.  Keep follow-up with our office.   Wyn Quaker FNP ------------------------------------------------------------------------------ Spoke with pt. He is aware of results. Nothing further was needed.

## 2018-09-11 NOTE — Progress Notes (Signed)
CT chest without contrast showing stable pulmonary nodules.  In the right middle lobe and left lower lobe.  They have been stable over the last 72-month interval.  We can follow this with a CT without contrast in 12 months. Due to your previous smoking history. There was an indeterminate density round lesion on your left kidney which radiology is recommending MRI.  I will forward these results to your primary care doctor so they can order this test and follow this with you.  See radiology impression below:   1. Stable subpleural nodules in RIGHT middle lobe and LEFT lower lobe. Nodule stable over six-month interval. If patient patient is high risk for carcinoma, recommend follow-up CT in 12 months from current month per Fleischner criteria. 2. Indeterminate density round lesion of the upper pole of the LEFT kidney. Recommend MRI with without contrast for further evaluation.  Keep follow-up with our office.   Wyn Quaker FNP

## 2018-09-15 ENCOUNTER — Other Ambulatory Visit: Payer: Self-pay

## 2018-09-15 DIAGNOSIS — N289 Disorder of kidney and ureter, unspecified: Secondary | ICD-10-CM

## 2018-09-15 DIAGNOSIS — N2889 Other specified disorders of kidney and ureter: Secondary | ICD-10-CM

## 2018-09-17 ENCOUNTER — Telehealth: Payer: Self-pay | Admitting: Family Medicine

## 2018-09-17 DIAGNOSIS — I10 Essential (primary) hypertension: Secondary | ICD-10-CM

## 2018-09-17 NOTE — Telephone Encounter (Signed)
Lab ordered per radiology protocol.  

## 2018-09-22 DIAGNOSIS — I10 Essential (primary) hypertension: Secondary | ICD-10-CM | POA: Diagnosis not present

## 2018-09-23 LAB — COMPREHENSIVE METABOLIC PANEL
AG Ratio: 1.7 (calc) (ref 1.0–2.5)
ALKALINE PHOSPHATASE (APISO): 111 U/L (ref 40–115)
ALT: 20 U/L (ref 9–46)
AST: 19 U/L (ref 10–35)
Albumin: 4.3 g/dL (ref 3.6–5.1)
BUN: 12 mg/dL (ref 7–25)
CALCIUM: 9.5 mg/dL (ref 8.6–10.3)
CO2: 29 mmol/L (ref 20–32)
Chloride: 96 mmol/L — ABNORMAL LOW (ref 98–110)
Creat: 1.13 mg/dL (ref 0.70–1.25)
GLOBULIN: 2.6 g/dL (ref 1.9–3.7)
Glucose, Bld: 142 mg/dL — ABNORMAL HIGH (ref 65–99)
Potassium: 3.8 mmol/L (ref 3.5–5.3)
Sodium: 133 mmol/L — ABNORMAL LOW (ref 135–146)
Total Bilirubin: 0.3 mg/dL (ref 0.2–1.2)
Total Protein: 6.9 g/dL (ref 6.1–8.1)

## 2018-09-24 ENCOUNTER — Telehealth: Payer: Self-pay | Admitting: Family Medicine

## 2018-09-24 ENCOUNTER — Encounter: Payer: Self-pay | Admitting: Pulmonary Disease

## 2018-09-24 ENCOUNTER — Ambulatory Visit (INDEPENDENT_AMBULATORY_CARE_PROVIDER_SITE_OTHER): Payer: Medicare Other | Admitting: Pulmonary Disease

## 2018-09-24 ENCOUNTER — Other Ambulatory Visit: Payer: Self-pay | Admitting: *Deleted

## 2018-09-24 VITALS — BP 114/62 | HR 71 | Ht 67.0 in | Wt 200.0 lb

## 2018-09-24 DIAGNOSIS — R058 Other specified cough: Secondary | ICD-10-CM

## 2018-09-24 DIAGNOSIS — R05 Cough: Secondary | ICD-10-CM | POA: Diagnosis not present

## 2018-09-24 DIAGNOSIS — R918 Other nonspecific abnormal finding of lung field: Secondary | ICD-10-CM | POA: Diagnosis not present

## 2018-09-24 DIAGNOSIS — R911 Solitary pulmonary nodule: Secondary | ICD-10-CM | POA: Diagnosis not present

## 2018-09-24 DIAGNOSIS — G4733 Obstructive sleep apnea (adult) (pediatric): Secondary | ICD-10-CM

## 2018-09-24 DIAGNOSIS — J4520 Mild intermittent asthma, uncomplicated: Secondary | ICD-10-CM

## 2018-09-24 DIAGNOSIS — Z9989 Dependence on other enabling machines and devices: Secondary | ICD-10-CM

## 2018-09-24 DIAGNOSIS — I10 Essential (primary) hypertension: Secondary | ICD-10-CM

## 2018-09-24 MED ORDER — ALPRAZOLAM 0.5 MG PO TABS: 0.5000 mg | ORAL_TABLET | Freq: Once | ORAL | 0 refills | Status: DC | PRN

## 2018-09-24 NOTE — Telephone Encounter (Signed)
Pt advised.

## 2018-09-24 NOTE — Patient Instructions (Signed)
Will schedule CT chest for November 2020 and follow up after that

## 2018-09-24 NOTE — Progress Notes (Signed)
Brandon [name], Critical Care, and Sleep Medicine  Chief Complaint  Patient presents with  . Follow-up    Patient is doing well with new mask and the cpap machine overall.    Constitutional:  BP 114/62 (BP Location: Left Arm, Cuff Size: Normal)   Pulse 71   Ht 5\' 7"  (1.702 m)   Wt 200 lb (90.7 kg)   SpO2 97%   BMI 31.32 kg/m   Past Medical History:  ED, GERD, Hep C, HLD, HTN  Brief Summary:  Brandon [name] is a 67 y.o. male former smoker with a mild intermittent asthma, upper airway cough, OSA, and lung nodule.  He had CT chest in November (reviewed by me).  No change in lung nodules.  He has occasional sinus congestion with post nasal drip, and this triggers a cough.  Not that bad.  Not having wheeze or shortness of breath.  Hasn't used symbicort or albuterol for a while.  Uses CPAP nightly.  Has new full face mask, and fits better.  Reviewed his download with him.  Physical Exam:   Appearance - well kempt   ENMT - clear nasal mucosa, mild deviated nasal  septum, no oral exudates, no LAN, trachea midline  Respiratory - normal chest wall, normal respiratory effort, no accessory muscle use, no wheeze/rales  CV - s1s2 regular rate and rhythm, no murmurs, no peripheral edema, radial pulses symmetric  GI - soft, non tender, no masses  Lymph - no adenopathy noted in neck and axillary areas  MSK - normal gait  Ext - no cyanosis, clubbing, or joint inflammation noted  Skin - no rashes, lesions, or ulcers  Neuro - normal strength, oriented x 3  Psych - normal mood and affect  Assessment/Plan:   Mild, intermittent asthma. - continue singulair - resume symbicort if his symptoms progress  Upper airway cough. - flonase, atrovent, singulair, nasal irrigation - OTC antihistamine  Lung nodule with hx of smoking. - f/u CT chest w/o contrast in November 2020  Obstructive sleep apnea. - he is compliant with CPAP and reports benefit from therapy -  continue CPAP 12 cm H2O   Patient Instructions  Will schedule CT chest for November 2020 and follow up after that    Brandon Mires, MD [place] Pager: [phone] 09/24/2018, 11:03 AM  Flow Sheet     [name] tests:  CT chest 01/18/17 >> calcified granulomas, 5 mm nodule LUL, 5 mm nodule LLL, centrilobular nodularity RML Spirometry 02/14/18 >> FVC 70%, FEV1 73%, FEV1% 77 RAST 02/20/18 >> negative, IgE 15 CT chest 03/07/18 >> scattered nodules up to 6 mm PFT 03/10/18 >> FEV1 2.47 (93%), FEV1% 83, TLC 5.15 (79%), DLCO 71%, no BD CT chest 09/10/18 >> 6 mm subpleural nodule LLL, 6 mm nodule RML no change  Sleep tests:  ONO with RA 03/06/18 >>test time 7 hrs 25 min. Average SpO2 96%, low SpO2 92%.  CPAP 08/25/18 to 09/23/18 >> used on 30 of 30 nights with average 7 hrs 32 min.  Average AHI 0.7 with CPAP 12 cm H2O.  Medications:   Allergies as of 09/24/2018   No Known Allergies     Medication List        Accurate as of 09/24/18 11:03 AM. Always use your most recent med list.          albuterol 108 (90 Base) MCG/ACT inhaler Commonly known as:  PROVENTIL HFA;VENTOLIN HFA Inhale 2 puffs into the lungs every 6 (six) hours as needed for  wheezing or shortness of breath.   ALPRAZolam 0.5 MG tablet Commonly known as:  XANAX Take 1 tablet (0.5 mg total) by mouth once as needed for up to 1 dose for anxiety. Can repeat dose in 1 hour if needed.   atorvastatin 20 MG tablet Commonly known as:  LIPITOR Take 1 tablet (20 mg total) by mouth daily.   baclofen 10 MG tablet Commonly known as:  LIORESAL Take 1 tablet (10 mg total) by mouth every 8 (eight) hours as needed (migraine (may cause drowsiness)).   budesonide-formoterol 80-4.5 MCG/ACT inhaler Commonly known as:  SYMBICORT Inhale 2 puffs into the lungs 2 (two) times daily.   budesonide-formoterol 80-4.5 MCG/ACT inhaler Commonly known as:  SYMBICORT Inhale 2 puffs into the lungs 2 (two) times daily.     clindamycin 1 % lotion Commonly known as:  CLEOCIN T Apply topically 2 (two) times daily.   clindamycin 1 % Swab Commonly known as:  CLEOCIN T   clotrimazole-betamethasone cream Commonly known as:  LOTRISONE Apply topically 2 (two) times daily.   diclofenac sodium 1 % Gel Commonly known as:  VOLTAREN Apply 2 g topically 4 (four) times daily. To affected areas   fluticasone 50 MCG/ACT nasal spray Commonly known as:  FLONASE Place 2 sprays into both nostrils daily.   hydrocortisone 2.5 % cream Apply topically as needed. Reported on 04/19/2016   hydrocortisone 25 MG suppository Commonly known as:  ANUSOL-HC Place 25 mg rectally as needed. Reported on 04/19/2016   ipratropium 0.06 % nasal spray Commonly known as:  ATROVENT Place 2 sprays into both nostrils every 4 (four) hours as needed for rhinitis.   losartan-hydrochlorothiazide 100-25 MG tablet Commonly known as:  HYZAAR Take 1 tablet by mouth daily. ADDITIONAL REFILLS REQUIRE AN APPOINTMENT.   montelukast 10 MG tablet Commonly known as:  SINGULAIR TAKE 1 TABLET BY MOUTH AT BEDTIME.   omeprazole 40 MG capsule Commonly known as:  PRILOSEC TAKE 1 CAPSULE BY MOUTH EVERY DAY   tadalafil 5 MG tablet Commonly known as:  CIALIS Take 5 mg by mouth daily.   tamsulosin 0.4 MG Caps capsule Commonly known as:  FLOMAX Take 0.4 mg by mouth.   terbinafine 1 % cream Commonly known as:  LAMISIL Apply 1 application topically 2 (two) times daily.   traZODone 50 MG tablet Commonly known as:  DESYREL Take 50 mg by mouth at bedtime.   valACYclovir 500 MG tablet Commonly known as:  VALTREX TAKE 1 TABLET BY MOUTH EVERY DAY       Past Surgical History:  He  has a past surgical history that includes Foot surgery (Right); Foot surgery (Left); and Inguinal hernia repair (Right).  Family History:  His family history includes Breast cancer in his sister; Depression in his sister; Hyperlipidemia in his unknown relative;  Hypertension in his unknown relative; Ovarian cancer in his sister.  Social History:  He  reports that he quit smoking about 21 years ago. His smoking use included cigarettes. He has a 22.50 pack-year smoking history. He has never used smokeless tobacco. He reports that he does not drink alcohol or use drugs.

## 2018-09-24 NOTE — Telephone Encounter (Signed)
-----   Message from Katha Hamming sent at 09/24/2018  9:23 AM EST ----- Regarding: mr abd Brandon [name] is scheduled for his MRI this Saturday, 09/27/18 at 8 am.  He has requested some claustrophobia medication called in for him.  Thanks so much! Hoyle Sauer

## 2018-09-24 NOTE — Telephone Encounter (Signed)
-----   Message from Katha Hamming sent at 09/24/2018  9:23 AM EST ----- Regarding: Brandon [name] is scheduled for his MRI this Saturday, 09/27/18 at 8 am.  He has requested some claustrophobia medication called in for him.  Thanks so much! Hoyle Sauer

## 2018-09-24 NOTE — Telephone Encounter (Signed)
Already addressed. Closing encounter.

## 2018-09-24 NOTE — Telephone Encounter (Signed)
Call patient and let him know that I sent over a couple tabs of Xanax to take before his MRI this Saturday.  He can take his first dose about an hour before hand and repeat the dosing if needed right before the procedure.  He will need someone to drive him though.

## 2018-09-27 ENCOUNTER — Ambulatory Visit (HOSPITAL_BASED_OUTPATIENT_CLINIC_OR_DEPARTMENT_OTHER)
Admission: RE | Admit: 2018-09-27 | Discharge: 2018-09-27 | Disposition: A | Discharge status: Home / Self Care | Payer: Medicare Other | Source: Ambulatory Visit | Attending: Family Medicine | Admitting: Family Medicine

## 2018-09-27 DIAGNOSIS — N2889 Other specified disorders of kidney and ureter: Secondary | ICD-10-CM | POA: Diagnosis not present

## 2018-09-27 DIAGNOSIS — N289 Disorder of kidney and ureter, unspecified: Secondary | ICD-10-CM

## 2018-09-27 DIAGNOSIS — K760 Fatty (change of) liver, not elsewhere classified: Secondary | ICD-10-CM | POA: Insufficient documentation

## 2018-09-27 DIAGNOSIS — N281 Cyst of kidney, acquired: Secondary | ICD-10-CM | POA: Insufficient documentation

## 2018-09-27 MED ORDER — GADOBENATE DIMEGLUMINE 529 MG/ML IV SOLN
20.0000 mL | Freq: Once | INTRAVENOUS | Status: AC | PRN
  Administered 2018-09-27: 19 mL via INTRAVENOUS

## 2018-10-29 ENCOUNTER — Other Ambulatory Visit: Payer: Self-pay | Admitting: Family Medicine

## 2018-10-29 DIAGNOSIS — G43701 Chronic migraine without aura, not intractable, with status migrainosus: Secondary | ICD-10-CM

## 2018-11-14 ENCOUNTER — Other Ambulatory Visit: Payer: Self-pay | Admitting: Family Medicine

## 2018-11-17 ENCOUNTER — Encounter: Payer: Self-pay | Admitting: Family Medicine

## 2018-11-17 ENCOUNTER — Ambulatory Visit (INDEPENDENT_AMBULATORY_CARE_PROVIDER_SITE_OTHER): Payer: Medicare Other | Admitting: Family Medicine

## 2018-11-17 VITALS — BP 125/65 | Ht 67.0 in

## 2018-11-17 DIAGNOSIS — J019 Acute sinusitis, unspecified: Secondary | ICD-10-CM

## 2018-11-17 DIAGNOSIS — I10 Essential (primary) hypertension: Secondary | ICD-10-CM

## 2018-11-17 MED ORDER — HYDROCHLOROTHIAZIDE 25 MG PO TABS: 25.0000 mg | ORAL_TABLET | Freq: Every day | ORAL | 1 refills | Status: DC

## 2018-11-17 MED ORDER — AMOXICILLIN-POT CLAVULANATE 875-125 MG PO TABS: 1.0000 | ORAL_TABLET | Freq: Two times a day (BID) | ORAL | 0 refills | Status: DC

## 2018-11-17 MED ORDER — LOSARTAN POTASSIUM 100 MG PO TABS: 100.0000 mg | ORAL_TABLET | Freq: Every day | ORAL | 1 refills | Status: DC

## 2018-11-17 NOTE — Progress Notes (Signed)
Acute Office Visit  Subjective:    Patient ID: Brandon [name], male    DOB: [birth year], 68 y.o.   MRN: [id number]  Chief Complaint  Patient presents with  . Sinusitis    pt reports that his sxs began 5 wks ago and then got better then started up again 2 wks ago. c/o sinus pressure, itchy ears,scratchy sore throat and drainage. sweats, he has been using tylenol    HPI Patient is in today for pt reports that his sxs began 5 wks ago and then got some  better then started to get worse  again 2 wks ago. c/o sinus pressure, itchy ears,scratchy sore throat and drainage. sweats, he has been using tylenol that does seem to help with low-grade fever as well.  Does have some constant sinus irritation and drainage he takes his Singulair and uses his nasal sprays but has not been able to get rid of this completely..     Past Medical History:  Diagnosis Date  . ED (erectile dysfunction)   . GERD (gastroesophageal reflux disease)   . Hepatitis C    [id number], treated at [facility]  . Hyperlipidemia   . Hypertension   . Nose fracture     Past Surgical History:  Procedure Laterality Date  . FOOT SURGERY Right    1980  . FOOT SURGERY Left    1980  . INGUINAL HERNIA REPAIR Right    2009    Family History  Problem Relation Age of Onset  . Breast cancer Sister   . Depression Sister   . Hyperlipidemia Unknown   . Ovarian cancer Sister   . Hypertension Unknown     Social History   Socioeconomic History  . Marital status: Married    Spouse name: Toshiyuki Fredell  . Number of children: 2  . Years of education: Not on file  . Highest education level: Not on file  Occupational History  . Occupation: Investment banker, corporate Rep    Comment: Dept of [employer] in [place]  . Financial resource strain: Not on file  . Food insecurity:    Worry: Not on file    Inability: Not on file  . Transportation needs:    Medical: Not on file    Non-medical: Not on file  Tobacco Use  .  Smoking status: Former Smoker    Packs/day: 1.50    Years: 15.00    Pack years: 22.50    Types: Cigarettes    Last attempt to quit: 10/22/1996    Years since quitting: 22.0  . Smokeless tobacco: Never Used  Substance and Sexual Activity  . Alcohol use: No  . Drug use: No  . Sexual activity: Yes    Partners: Female  Lifestyle  . Physical activity:    Days per week: Not on file    Minutes per session: Not on file  . Stress: Not on file  Relationships  . Social connections:    Talks on phone: Not on file    Gets together: Not on file    Attends religious service: Not on file    Active member of club or organization: Not on file    Attends meetings of clubs or organizations: Not on file    Relationship status: Not on file  . Intimate partner violence:    Fear of current or ex partner: Not on file    Emotionally abused: Not on file    Physically abused: Not on file  Forced sexual activity: Not on file  Other Topics Concern  . Not on file  Social History Narrative   No caffeine.  Occ exercise. Has a 2 years associate degree. Previously worked at [employer] for 12 years. Also work to [employer].    Outpatient Medications Prior to Visit  Medication Sig Dispense Refill  . albuterol (PROAIR HFA) 108 (90 Base) MCG/ACT inhaler Inhale 2 puffs into the lungs every 6 (six) hours as needed for wheezing or shortness of breath. 18 g 4  . ALPRAZolam (XANAX) 0.5 MG tablet Take 1 tablet (0.5 mg total) by mouth once as needed for up to 1 dose for anxiety. Can repeat dose in 1 hour if needed. 2 tablet 0  . atorvastatin (LIPITOR) 20 MG tablet TAKE 1 TABLET BY MOUTH EVERY DAY 90 tablet 3  . baclofen (LIORESAL) 10 MG tablet Take 1 tablet (10 mg total) by mouth every 8 (eight) hours as needed (migraine (may cause drowsiness)). 30 each 0  . budesonide-formoterol (SYMBICORT) 80-4.5 MCG/ACT inhaler Inhale 2 puffs into the lungs 2 (two) times daily. 1 Inhaler 0  . clindamycin (CLEOCIN T) 1 % lotion Apply topically 2  (two) times daily.    . clindamycin (CLEOCIN T) 1 % SWAB     . clotrimazole-betamethasone (LOTRISONE) cream Apply topically 2 (two) times daily. 45 g 3  . diclofenac sodium (VOLTAREN) 1 % GEL Apply 2 g topically 4 (four) times daily. To affected areas 400 g PRN  . fluticasone (FLONASE) 50 MCG/ACT nasal spray Place 2 sprays into both nostrils daily. 16 g 2  . hydrocortisone (ANUSOL-HC) 25 MG suppository Place 25 mg rectally as needed. Reported on 04/19/2016    . hydrocortisone 2.5 % cream Apply topically as needed. Reported on 04/19/2016    . ipratropium (ATROVENT) 0.06 % nasal spray Place 2 sprays into both nostrils every 4 (four) hours as needed for rhinitis. 10 mL 6  . montelukast (SINGULAIR) 10 MG tablet TAKE 1 TABLET BY MOUTH AT BEDTIME. 30 tablet 5  . omeprazole (PRILOSEC) 40 MG capsule TAKE 1 CAPSULE BY MOUTH EVERY DAY 90 capsule 3  . tadalafil (CIALIS) 5 MG tablet Take 5 mg by mouth daily.  11  . tamsulosin (FLOMAX) 0.4 MG CAPS capsule Take 0.4 mg by mouth.    . terbinafine (LAMISIL) 1 % cream Apply 1 application topically 2 (two) times daily.    [name] topiramate (TOPAMAX) 25 MG tablet TAKE 1 TABLET BY MOUTH TWICE A DAY. 180 tablet 1  . traZODone (DESYREL) 50 MG tablet Take 50 mg by mouth at bedtime.    . valACYclovir (VALTREX) 500 MG tablet TAKE 1 TABLET BY MOUTH EVERY DAY 30 tablet 0  . hydrochlorothiazide (HYDRODIURIL) 25 MG tablet Take 25 mg by mouth daily.  0  . losartan (COZAAR) 100 MG tablet Take 100 mg by mouth daily.  0  . budesonide-formoterol (SYMBICORT) 80-4.5 MCG/ACT inhaler Inhale 2 puffs into the lungs 2 (two) times daily.    [name] losartan-hydrochlorothiazide (HYZAAR) 100-25 MG tablet Take 1 tablet by mouth daily. ADDITIONAL REFILLS REQUIRE AN APPOINTMENT. 90 tablet 0   No facility-administered medications prior to visit.     No Known Allergies  ROS     Objective:    Physical Exam  Constitutional: He is oriented to person, place, and time. He appears well-developed and  well-nourished.  HENT:  Head: Normocephalic and atraumatic.  Right Ear: External ear normal.  Left Ear: External ear normal.  Nose: Nose normal.  Mouth/Throat: Oropharynx is  clear and moist.  TMs and canals are clear.   Eyes: Pupils are equal, round, and reactive to light. Conjunctivae and EOM are normal.  Neck: Neck supple. No thyromegaly present.  Cardiovascular: Normal rate and normal heart sounds.  Pulmonary/Chest: Effort normal and breath sounds normal.  Lymphadenopathy:    He has no cervical adenopathy.  Neurological: He is alert and oriented to person, place, and time.  Skin: Skin is warm and dry.  Psychiatric: He has a normal mood and affect.    BP 125/65   Ht 5\' 7"  (1.702 m)   BMI 31.32 kg/m  Wt Readings from Last 3 Encounters:  09/24/18 200 lb (90.7 kg)  08/08/18 194 lb (88 kg)  05/05/18 204 lb (92.5 kg)    There are no preventive care reminders to display for this patient.  There are no preventive care reminders to display for this patient.   Lab Results  Component Value Date   TSH 0.790 10/29/2013   Lab Results  Component Value Date   WBC 5.9 02/20/2018   HGB 13.3 02/20/2018   HCT 40.8 02/20/2018   MCV 71 (L) 02/20/2018   PLT 177 09/07/2014   Lab Results  Component Value Date   NA 133 (L) 09/22/2018   K 3.8 09/22/2018   CO2 29 09/22/2018   GLUCOSE 142 (H) 09/22/2018   BUN 12 09/22/2018   CREATININE 1.13 09/22/2018   BILITOT 0.3 09/22/2018   ALKPHOS 106 02/20/2018   AST 19 09/22/2018   ALT 20 09/22/2018   PROT 6.9 09/22/2018   ALBUMIN 4.5 02/20/2018   CALCIUM 9.5 09/22/2018   Lab Results  Component Value Date   CHOL 171 01/30/2018   Lab Results  Component Value Date   HDL 31 (L) 01/30/2018   Lab Results  Component Value Date   LDLCALC 113 (H) 01/30/2018   Lab Results  Component Value Date   TRIG 155 (H) 01/30/2018   Lab Results  Component Value Date   CHOLHDL 5.5 (H) 01/30/2018   Lab Results  Component Value Date   HGBA1C  5.0 02/23/2015       Assessment & Plan:   Problem List Items Addressed This Visit      Cardiovascular and Mediastinum   Essential hypertension, benign   Relevant Medications   hydrochlorothiazide (HYDRODIURIL) 25 MG tablet   losartan (COZAAR) 100 MG tablet    Other Visit Diagnoses    Acute non-recurrent sinusitis, unspecified location    -  Primary   Relevant Medications   amoxicillin-clavulanate (AUGMENTIN) 875-125 MG tablet     hypertension-well controlled.  Continue current regimen.  Refills sent to pharmacy.  Acute sinusitis-we will treat with Augmentin.  If not better in 5 to 10 days and please give [place] call back.  Meds ordered this encounter  Medications  . hydrochlorothiazide (HYDRODIURIL) 25 MG tablet    Sig: Take 1 tablet (25 mg total) by mouth daily.    Dispense:  90 tablet    Refill:  1  . losartan (COZAAR) 100 MG tablet    Sig: Take 1 tablet (100 mg total) by mouth daily.    Dispense:  90 tablet    Refill:  1  . amoxicillin-clavulanate (AUGMENTIN) 875-125 MG tablet    Sig: Take 1 tablet by mouth 2 (two) times daily.    Dispense:  14 tablet    Refill:  0     Beatrice Lecher, MD

## 2018-11-24 ENCOUNTER — Telehealth: Payer: Self-pay

## 2018-11-24 NOTE — Telephone Encounter (Signed)
Brandon [name] called and states he was seen last week. He is still sneezing and has a lot of drainage in his throat. He is taking Flonase and wanted to know what else is there he can take.

## 2018-11-24 NOTE — Telephone Encounter (Signed)
He does fell better and did feel better on the antibiotic. He just has sneezing and drainage in the back of his throat. He does report pressure in his face and headaches. He is taking the nasal sprays and Singulair. Denies fever, chills or sweats.

## 2018-11-24 NOTE — Telephone Encounter (Signed)
Did he feel better and on the antibiotic?  If not,  okay to send in a prescription for azithromycin Z-Pak to pharmacy.

## 2018-11-25 MED ORDER — CETIRIZINE HCL 10 MG PO TABS: 10.0000 mg | ORAL_TABLET | Freq: Every day | ORAL | 3 refills | Status: DC

## 2018-11-25 NOTE — Telephone Encounter (Signed)
Okay, have him add a Zyrtec at bedtime for the next few days and see if that helps.  If not then please let me know.  He can also try doing the nasal saline irrigation at bedtime just to counter rinse everything out and that can sometimes be helpful.

## 2018-11-25 NOTE — Telephone Encounter (Signed)
Pt advised.   Rx sent for Zyrtec. He has been using ipratropium 0.06% and Flonase 103mcg daily. Also using his neti pot. Advised him to start doing the neti pot right before bed. Will call back with status update in a couple days.

## 2018-12-18 NOTE — Progress Notes (Signed)
Subjective:   Brandon [name] is a 68 y.o. male who presents for an Initial Medicare Annual Wellness Visit.  Review of Systems  No ROS.  Medicare Wellness Visit. Additional risk factors are reflected in the social history.  Cardiac Risk Factors include: advanced age (>47men, >34 women);dyslipidemia;male gender  Sleep patterns: getting average of 5-6 hours of sleep a night. Wakes up 3-4 times a night to void.  Wakes up feeling refreshed Home Safety/Smoke Alarms: Feels safe in home. Smoke alarms in place.  Living environment;Lives with wife in 1 story home with basement- stairs have handrails in place. 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    Male:   CCS-  utd   PSA-  utd Lab Results  Component Value Date   PSA 0.6 08/08/2018       Objective:    Today's Vitals   12/23/18 0907  BP: 122/70  Pulse: 62  SpO2: 98%  Weight: 197 lb (89.4 kg)  Height: 5\' 7"  (1.702 m)  PainSc: 7    Body mass index is 30.85 kg/m.  Advanced Directives 12/23/2018  Does Patient Have a Medical Advance Directive? No  Would patient like information on creating a medical advance directive? Yes (MAU/Ambulatory/Procedural Areas - Information given)    Current Medications (verified) Outpatient Encounter Medications as of 12/23/2018  Medication Sig  . albuterol (PROAIR HFA) 108 (90 Base) MCG/ACT inhaler Inhale 2 puffs into the lungs every 6 (six) hours as needed for wheezing or shortness of breath.  [name] atorvastatin (LIPITOR) 20 MG tablet TAKE 1 TABLET BY MOUTH EVERY DAY  . baclofen (LIORESAL) 10 MG tablet Take 1 tablet (10 mg total) by mouth every 8 (eight) hours as needed (migraine (may cause drowsiness)).  . budesonide-formoterol (SYMBICORT) 80-4.5 MCG/ACT inhaler Inhale 2 puffs into the lungs 2 (two) times daily.  . cetirizine (ZYRTEC) 10 MG tablet Take 1 tablet (10 mg total) by mouth daily.  . clindamycin (CLEOCIN T) 1 % lotion Apply  topically 2 (two) times daily.  . clindamycin (CLEOCIN T) 1 % SWAB   . clotrimazole-betamethasone (LOTRISONE) cream Apply topically 2 (two) times daily.  . diclofenac sodium (VOLTAREN) 1 % GEL Apply 2 g topically 4 (four) times daily. To affected areas  . DULoxetine (CYMBALTA) 30 MG capsule Take 30 mg by mouth daily. Take one capsule by mouth for 14 days then take 2 capsules daily for mood.  . fluticasone (FLONASE) 50 MCG/ACT nasal spray Place 2 sprays into both nostrils daily.  . hydrochlorothiazide (HYDRODIURIL) 25 MG tablet Take 1 tablet (25 mg total) by mouth daily.  . hydrocortisone (ANUSOL-HC) 25 MG suppository Place 25 mg rectally as needed. Reported on 04/19/2016  . hydrocortisone 2.5 % cream Apply topically as needed. Reported on 04/19/2016  . ipratropium (ATROVENT) 0.06 % nasal spray Place 2 sprays into both nostrils every 4 (four) hours as needed for rhinitis.  [name] losartan (COZAAR) 100 MG tablet Take 1 tablet (100 mg total) by mouth daily.  . montelukast (SINGULAIR) 10 MG tablet TAKE 1 TABLET BY MOUTH AT BEDTIME.  [name] omeprazole (PRILOSEC) 40 MG capsule TAKE 1 CAPSULE BY MOUTH EVERY DAY  . tadalafil (CIALIS) 5 MG tablet Take 5 mg by mouth daily.  . tamsulosin (FLOMAX) 0.4 MG CAPS capsule Take 0.4 mg by mouth.  . terbinafine (LAMISIL) 1 % cream Apply 1 application topically 2 (two) times daily.  [name] topiramate (TOPAMAX) 25 MG tablet TAKE 1 TABLET  BY MOUTH TWICE A DAY.  . valACYclovir (VALTREX) 500 MG tablet TAKE 1 TABLET BY MOUTH EVERY DAY  . ALPRAZolam (XANAX) 0.5 MG tablet Take 1 tablet (0.5 mg total) by mouth once as needed for up to 1 dose for anxiety. Can repeat dose in 1 hour if needed. (Patient not taking: Reported on 12/23/2018)  . [DISCONTINUED] amoxicillin-clavulanate (AUGMENTIN) 875-125 MG tablet Take 1 tablet by mouth 2 (two) times daily.  . [DISCONTINUED] traZODone (DESYREL) 50 MG tablet Take 50 mg by mouth at bedtime.  . [DISCONTINUED] valACYclovir (VALTREX) 500 MG tablet TAKE 1  TABLET BY MOUTH EVERY DAY   No facility-administered encounter medications on file as of 12/23/2018.     Allergies (verified) Patient has no known allergies.   History: Past Medical History:  Diagnosis Date  . ED (erectile dysfunction)   . GERD (gastroesophageal reflux disease)   . Hepatitis C    [id number], treated at [facility]  . Hyperlipidemia   . Hypertension   . Nose fracture    Past Surgical History:  Procedure Laterality Date  . FOOT SURGERY Right    1980  . FOOT SURGERY Left    1980  . INGUINAL HERNIA REPAIR Right    2009   Family History  Problem Relation Age of Onset  . Breast cancer Sister   . Depression Sister   . Hyperlipidemia Other   . Ovarian cancer Sister   . Hypertension Other   . Heart disease Brother    Social History   Socioeconomic History  . Marital status: Married    Spouse name: Elchanan Bob  . Number of children: 4  . Years of education: 37  . Highest education level: Associate degree: academic program  Occupational History  . Occupation: Investment banker, corporate Rep    Comment: Dept of [employer] in [place]  . Financial resource strain: Not hard at all  . Food insecurity:    Worry: Never true    Inability: Never true  . Transportation needs:    Medical: No    Non-medical: No  Tobacco Use  . Smoking status: Former Smoker    Packs/day: 1.50    Years: 15.00    Pack years: 22.50    Types: Cigarettes    Last attempt to quit: 10/22/1996    Years since quitting: 22.1  . Smokeless tobacco: Never Used  Substance and Sexual Activity  . Alcohol use: No  . Drug use: No  . Sexual activity: Yes    Partners: Female  Lifestyle  . Physical activity:    Days per week: 0 days    Minutes per session: 0 min  . Stress: Not at all  Relationships  . Social connections:    Talks on phone: Once a week    Gets together: Never    Attends religious service: More than 4 times per year    Active member of club or organization: No     Attends meetings of clubs or organizations: Never    Relationship status: Married  Other Topics Concern  . Not on file  Social History Narrative   No caffeine.  Has a 2 years associate degree. Previously worked at [employer] for 12 years. Also work to [employer].   Tobacco Counseling Counseling given: No   Clinical Intake:  Pre-visit preparation completed: Yes  Pain : 0-10 Pain Score: 7  Pain Type: Chronic pain Pain Location: Back Pain Orientation: Lower, Mid, Right, Left Pain Radiating Towards: down the legs Pain Descriptors /  Indicators: Constant Pain Onset: More than a month ago Pain Frequency: Constant Pain Relieving Factors: meds  Pain Relieving Factors: meds  Nutritional Risks: None Diabetes: No  How often do you need to have someone help you when you read instructions, pamphlets, or other written materials from your doctor or pharmacy?: 1 - Never What is the last grade level you completed in school?: 14  Interpreter Needed?: No  Information entered by :: Orlie Dakin, LPN  Activities of Daily Living In your present state of health, do you have any difficulty performing the following activities: 12/23/2018  Hearing? Y  Comment wears hearing aides. Has tinnitus  Vision? N  Difficulty concentrating or making decisions? N  Walking or climbing stairs? N  Dressing or bathing? N  Doing errands, shopping? N  Preparing Food and eating ? N  Using the Toilet? N  In the past six months, have you accidently leaked urine? Y  Do you have problems with loss of bowel control? Y  Comment has constipation. VA given suppositories and MIralax  Managing your Medications? N  Managing your Finances? N  Housekeeping or managing your Housekeeping? N  Some recent data might be hidden     Immunizations and Health Maintenance Immunization History  Administered Date(s) Administered  . Influenza Split 09/03/2018  . Influenza,inj,Quad PF,6+ Mos 07/14/2014, 08/23/2015  . Influenza-Unspecified  10/01/2011, 08/19/2012, 08/03/2013, 08/15/2016, 08/08/2017  . Pneumococcal Conjugate-13 10/01/2016  . Pneumococcal Polysaccharide-23 01/15/2012, 08/25/2015  . Tdap 11/18/2014  . Zoster 11/18/2014   There are no preventive care reminders to display for this patient.  Patient Care Team: Hali Marry, MD as PCP - General (Family Medicine) Hali Marry, MD as Consulting Physician (Family Medicine)  Indicate any recent Medical Services you may have received from other than [facility] providers in the past year (date may be approximate).    Assessment:   This is a routine wellness examination for Gopal.Physical assessment deferred to PCP.   Hearing/Vision screen  Visual Acuity Screening   Right eye Left eye Both eyes  Without correction:     With correction: 20/40 20/40 20/30   Hearing Screening Comments: Whisper test done. Pt reported all 3 words back correctly. Wearing hearing aides  Dietary issues and exercise activities discussed: Current Exercise Habits: The patient does not participate in regular exercise at present(due to back pain), Exercise limited by: orthopedic condition(s) Diet Eats a healthy diet of fruits and vegetables, smaller portions of food. Drinks water daily. Breakfast: egss, sausage grits, toast or oatmel fruit. Lunch: yogurt with fruit, nuts Dinner:  Meat and vegetables     Goals    . weight     Eats at least 3 full meals a day and snacks if needed. Continue to eat your fruits and vegetables. Loose weight at least 12 lbs in the year 2020.      Depression Screen PHQ 2/9 Scores 12/23/2018 01/30/2018 08/02/2017 01/31/2017  PHQ - 2 Score 1 2 2 3   PHQ- 9 Score - 9 10 10     Fall Risk Fall Risk  12/23/2018 09/09/2018 08/02/2017  Falls in the past year? 0 0 No  Comment - Emmi Telephone Survey: data to providers prior to load -  Follow up Falls prevention discussed - -    Is the patient's home free of loose throw rugs in walkways, pet beds, electrical  cords, etc?   yes      Grab bars in the bathroom? no      Handrails on the stairs?  yes      Adequate lighting?   yes   Cognitive Function:     6CIT Screen 12/23/2018  What Year? 0 points  What month? 0 points  What time? 0 points  Count back from 20 0 points  Months in reverse 0 points  Repeat phrase 2 points  Total Score 2    Screening Tests Health Maintenance  Topic Date Due  . COLONOSCOPY  08/04/2020  . TETANUS/TDAP  11/18/2024  . INFLUENZA VACCINE  Completed  . Hepatitis C Screening  Completed  . PNA vac Low Risk Adult  Completed        Plan:      Mr. Ocain , Thank you for taking time to come for your Medicare Wellness Visit. I appreciate your ongoing commitment to your health goals. Please review the following plan we discussed and let me know if I can assist you in the future.  Please schedule your next medicare wellness visit with me in 1 yr. Bring a copy of your living will and/or healthcare power of attorney to your next office visit. Continue doing brain stimulating activities (puzzles, reading, adult coloring books, staying active) to keep memory sharp.    These are the goals we discussed: Goals    . weight     Eats at least 3 full meals a day and snacks if needed. Continue to eat your fruits and vegetables. Loose weight at least 12 lbs in the year 2020.       This is a list of the screening recommended for you and due dates:  Health Maintenance  Topic Date Due  . Colon Cancer Screening  08/04/2020  . Tetanus Vaccine  11/18/2024  . Flu Shot  Completed  .  Hepatitis C: One time screening is recommended by Center for Disease Control  (CDC) for  adults born from [birth year] through 1966.   Completed  . Pneumonia vaccines  Completed     I have personally reviewed and noted the following in the patient's chart:   . Medical and social history . Use of alcohol, tobacco or illicit drugs  . Current medications and supplements . Functional ability and  status . Nutritional status . Physical activity . Advanced directives . List of other physicians . Hospitalizations, surgeries, and ER visits in previous 12 months . Vitals . Screenings to include cognitive, depression, and falls . Referrals and appointments  In addition, I have reviewed and discussed with patient certain preventive protocols, quality metrics, and best practice recommendations. A written personalized care plan for preventive services as well as general preventive health recommendations were provided to patient.     Joanne Chars, LPN   10/23/4578

## 2018-12-22 ENCOUNTER — Other Ambulatory Visit: Payer: Self-pay | Admitting: Family Medicine

## 2018-12-22 DIAGNOSIS — A6000 Herpesviral infection of urogenital system, unspecified: Secondary | ICD-10-CM

## 2018-12-23 ENCOUNTER — Ambulatory Visit (INDEPENDENT_AMBULATORY_CARE_PROVIDER_SITE_OTHER): Payer: Medicare Other | Admitting: *Deleted

## 2018-12-23 VITALS — BP 122/70 | HR 62 | Ht 67.0 in | Wt 197.0 lb

## 2018-12-23 DIAGNOSIS — Z0000 Encounter for general adult medical examination without abnormal findings: Secondary | ICD-10-CM | POA: Diagnosis not present

## 2018-12-23 NOTE — Patient Instructions (Addendum)
Brandon [name] , Thank you for taking time to come for your Medicare Wellness Visit. I appreciate your ongoing commitment to your health goals. Please review the following plan we discussed and let me know if I can assist you in the future.  Please schedule your next medicare wellness visit with me in 1 yr. Bring a copy of your living will and/or healthcare power of attorney to your next office visit. Continue doing brain stimulating activities (puzzles, reading, adult coloring books, staying active) to keep memory sharp.  These are the goals we discussed: Goals    . weight     Eats at least 3 full meals a day and snacks if needed. Continue to eat your fruits and vegetables. Loose weight at least 12 lbs in the year 2020.

## 2019-01-02 ENCOUNTER — Other Ambulatory Visit: Payer: Self-pay

## 2019-01-02 ENCOUNTER — Ambulatory Visit (INDEPENDENT_AMBULATORY_CARE_PROVIDER_SITE_OTHER): Payer: Medicare Other | Admitting: Family Medicine

## 2019-01-02 ENCOUNTER — Encounter: Payer: Self-pay | Admitting: Family Medicine

## 2019-01-02 VITALS — BP 136/69 | HR 68 | Ht 67.0 in | Wt 198.0 lb

## 2019-01-02 DIAGNOSIS — G43701 Chronic migraine without aura, not intractable, with status migrainosus: Secondary | ICD-10-CM | POA: Diagnosis not present

## 2019-01-02 DIAGNOSIS — Z8709 Personal history of other diseases of the respiratory system: Secondary | ICD-10-CM | POA: Diagnosis not present

## 2019-01-02 DIAGNOSIS — G43101 Migraine with aura, not intractable, with status migrainosus: Secondary | ICD-10-CM | POA: Diagnosis not present

## 2019-01-02 DIAGNOSIS — R519 Headache, unspecified: Secondary | ICD-10-CM

## 2019-01-02 DIAGNOSIS — R51 Headache: Secondary | ICD-10-CM

## 2019-01-02 NOTE — Progress Notes (Signed)
Acute Office Visit  Subjective:    Patient ID: Brandon [name], male    DOB: 14-Dec-1950, 68 y.o.   MRN: [id number]  Chief Complaint  Patient presents with  . Headache  . Sinusitis    HPI Patient is in today for persistant sinus symptoms and frequent headaches.  He does get light sensitivity and sound sensitivity sometimes.  Typical headaches usually last between 1 to 2 days mostly on the left side of his head but sometimes frontal.  He gets the more severe headache pain once or more per month.  Usually not weekly.  Right now he is taking a sinus medication and is currently on Topamax.  He is not sure if it is helping or not.  He did consult with ENT who recommended surgery thought he felt it could help his headaches it may not completely resolve them.   Past Medical History:  Diagnosis Date  . ED (erectile dysfunction)   . GERD (gastroesophageal reflux disease)   . Hepatitis C    [phone], treated at [facility]  . Hyperlipidemia   . Hypertension   . Nose fracture     Past Surgical History:  Procedure Laterality Date  . FOOT SURGERY Right    1980  . FOOT SURGERY Left    1980  . INGUINAL HERNIA REPAIR Right    2009    Family History  Problem Relation Age of Onset  . Breast cancer Sister   . Depression Sister   . Hyperlipidemia Other   . Ovarian cancer Sister   . Hypertension Other   . Heart disease Brother     Social History   Socioeconomic History  . Marital status: Married    Spouse name: Iren Whipp  . Number of children: 4  . Years of education: 31  . Highest education level: Associate degree: academic program  Occupational History  . Occupation: Investment banker, corporate Rep    Comment: Dept of [employer] in [place]  . Financial resource strain: Not hard at all  . Food insecurity:    Worry: Never true    Inability: Never true  . Transportation needs:    Medical: No    Non-medical: No  Tobacco Use  . Smoking status: Former Smoker     Packs/day: 1.50    Years: 15.00    Pack years: 22.50    Types: Cigarettes    Last attempt to quit: 10/22/1996    Years since quitting: 22.2  . Smokeless tobacco: Never Used  Substance and Sexual Activity  . Alcohol use: No  . Drug use: No  . Sexual activity: Yes    Partners: Female  Lifestyle  . Physical activity:    Days per week: 0 days    Minutes per session: 0 min  . Stress: Not at all  Relationships  . Social connections:    Talks on phone: Once a week    Gets together: Never    Attends religious service: More than 4 times per year    Active member of club or organization: No    Attends meetings of clubs or organizations: Never    Relationship status: Married  . Intimate partner violence:    Fear of current or ex partner: No    Emotionally abused: No    Physically abused: No    Forced sexual activity: No  Other Topics Concern  . Not on file  Social History Narrative   No caffeine.  Has a 2 years  associate degree. Previously worked at [employer] for 12 years. Also work to [employer].    Outpatient Medications Prior to Visit  Medication Sig Dispense Refill  . albuterol (PROAIR HFA) 108 (90 Base) MCG/ACT inhaler Inhale 2 puffs into the lungs every 6 (six) hours as needed for wheezing or shortness of breath. 18 g 4  . atorvastatin (LIPITOR) 20 MG tablet TAKE 1 TABLET BY MOUTH EVERY DAY 90 tablet 3  . budesonide-formoterol (SYMBICORT) 80-4.5 MCG/ACT inhaler Inhale 2 puffs into the lungs 2 (two) times daily. 1 Inhaler 0  . cetirizine (ZYRTEC) 10 MG tablet Take 1 tablet (10 mg total) by mouth daily. 90 tablet 3  . clindamycin (CLEOCIN T) 1 % lotion Apply topically 2 (two) times daily.    . clindamycin (CLEOCIN T) 1 % SWAB     . clotrimazole-betamethasone (LOTRISONE) cream Apply topically 2 (two) times daily. 45 g 3  . diclofenac sodium (VOLTAREN) 1 % GEL Apply 2 g topically 4 (four) times daily. To affected areas 400 g PRN  . DULoxetine (CYMBALTA) 30 MG capsule Take 30 mg by mouth daily.  Take one capsule by mouth for 14 days then take 2 capsules daily for mood.    . fluticasone (FLONASE) 50 MCG/ACT nasal spray Place 2 sprays into both nostrils daily. 16 g 2  . hydrochlorothiazide (HYDRODIURIL) 25 MG tablet Take 1 tablet (25 mg total) by mouth daily. 90 tablet 1  . hydrocortisone (ANUSOL-HC) 25 MG suppository Place 25 mg rectally as needed. Reported on 04/19/2016    . hydrocortisone 2.5 % cream Apply topically as needed. Reported on 04/19/2016    . ipratropium (ATROVENT) 0.06 % nasal spray Place 2 sprays into both nostrils every 4 (four) hours as needed for rhinitis. 10 mL 6  . losartan (COZAAR) 100 MG tablet Take 1 tablet (100 mg total) by mouth daily. 90 tablet 1  . montelukast (SINGULAIR) 10 MG tablet TAKE 1 TABLET BY MOUTH AT BEDTIME. 30 tablet 5  . omeprazole (PRILOSEC) 40 MG capsule TAKE 1 CAPSULE BY MOUTH EVERY DAY 90 capsule 3  . sertraline (ZOLOFT) 100 MG tablet Take 1 tablet by mouth daily.    . tadalafil (CIALIS) 5 MG tablet Take 5 mg by mouth daily.  11  . tamsulosin (FLOMAX) 0.4 MG CAPS capsule Take 0.4 mg by mouth.    . terbinafine (LAMISIL) 1 % cream Apply 1 application topically 2 (two) times daily.    . valACYclovir (VALTREX) 500 MG tablet TAKE 1 TABLET BY MOUTH EVERY DAY 30 tablet 0  . ALPRAZolam (XANAX) 0.5 MG tablet Take 1 tablet (0.5 mg total) by mouth once as needed for up to 1 dose for anxiety. Can repeat dose in 1 hour if needed. 2 tablet 0  . baclofen (LIORESAL) 10 MG tablet Take 1 tablet (10 mg total) by mouth every 8 (eight) hours as needed (migraine (may cause drowsiness)). 30 each 0  . topiramate (TOPAMAX) 25 MG tablet TAKE 1 TABLET BY MOUTH TWICE A DAY. 180 tablet 1   No facility-administered medications prior to visit.     No Known Allergies  ROS     Objective:    Physical Exam  Constitutional: He is oriented to person, place, and time. He appears well-developed and well-nourished.  HENT:  Head: Normocephalic and atraumatic.  Right Ear:  External ear normal.  Left Ear: External ear normal.  Nose: Nose normal.  Mouth/Throat: Oropharynx is clear and moist.  TMs and canals are clear.   Eyes: Pupils are  equal, round, and reactive to light. Conjunctivae and EOM are normal.  Neck: Neck supple. No thyromegaly present.  Cardiovascular: Normal rate and normal heart sounds.  Pulmonary/Chest: Effort normal and breath sounds normal.  Lymphadenopathy:    He has no cervical adenopathy.  Neurological: He is alert and oriented to person, place, and time.  Skin: Skin is warm and dry.  Psychiatric: He has a normal mood and affect.    BP 136/69   Pulse 68   Ht 5\' 7"  (1.702 m)   Wt 198 lb (89.8 kg)   SpO2 99%   BMI 31.01 kg/m  Wt Readings from Last 3 Encounters:  01/02/19 198 lb (89.8 kg)  12/23/18 197 lb (89.4 kg)  09/24/18 200 lb (90.7 kg)    There are no preventive care reminders to display for this patient.  There are no preventive care reminders to display for this patient.   Lab Results  Component Value Date   TSH 0.790 10/29/2013   Lab Results  Component Value Date   WBC 5.9 02/20/2018   HGB 13.3 02/20/2018   HCT 40.8 02/20/2018   MCV 71 (L) 02/20/2018   PLT 177 09/07/2014   Lab Results  Component Value Date   NA 133 (L) 09/22/2018   K 3.8 09/22/2018   CO2 29 09/22/2018   GLUCOSE 142 (H) 09/22/2018   BUN 12 09/22/2018   CREATININE 1.13 09/22/2018   BILITOT 0.3 09/22/2018   ALKPHOS 106 02/20/2018   AST 19 09/22/2018   ALT 20 09/22/2018   PROT 6.9 09/22/2018   ALBUMIN 4.5 02/20/2018   CALCIUM 9.5 09/22/2018   Lab Results  Component Value Date   CHOL 171 01/30/2018   Lab Results  Component Value Date   HDL 31 (L) 01/30/2018   Lab Results  Component Value Date   LDLCALC 113 (H) 01/30/2018   Lab Results  Component Value Date   TRIG 155 (H) 01/30/2018   Lab Results  Component Value Date   CHOLHDL 5.5 (H) 01/30/2018   Lab Results  Component Value Date   HGBA1C 5.0 02/23/2015        Assessment & Plan:   Problem List Items Addressed This Visit      Cardiovascular and Mediastinum   Migraine with aura and with status migrainosus, not intractable   Relevant Medications   sertraline (ZOLOFT) 100 MG tablet   topiramate (TOPAMAX) 50 MG tablet    Other Visit Diagnoses    History of chronic sinusitis    -  Primary   Sinus headache       Relevant Medications   sertraline (ZOLOFT) 100 MG tablet   topiramate (TOPAMAX) 50 MG tablet   Chronic migraine without aura with status migrainosus, not intractable       Relevant Medications   sertraline (ZOLOFT) 100 MG tablet   topiramate (TOPAMAX) 50 MG tablet     Migraine headache-we will increase Topamax to 50 mg.  We will also complete paperwork that he brought in with him today.  History of sinus headache and chronic sinusitis-he did consult with ENT and they have recommended surgery.  He is considering it but has not definitively decided for now he is continue with his allergy medications.  Meds ordered this encounter  Medications  . topiramate (TOPAMAX) 50 MG tablet    Sig: Take 1 tablet (50 mg total) by mouth 2 (two) times daily. TAKE 1 TABLET BY MOUTH TWICE A DAY.    Dispense:  60 tablet  Refill:  0    DK:C46.190     Beatrice Lecher, MD

## 2019-01-05 ENCOUNTER — Encounter: Payer: Self-pay | Admitting: Family Medicine

## 2019-01-05 MED ORDER — TOPIRAMATE 50 MG PO TABS: 50.0000 mg | ORAL_TABLET | Freq: Two times a day (BID) | ORAL | 0 refills | Status: DC

## 2019-01-06 ENCOUNTER — Telehealth: Payer: Self-pay | Admitting: *Deleted

## 2019-01-06 NOTE — Telephone Encounter (Signed)
Pt called and advised that form has been completed and will be up front for p/u. Also advised that this has been faxed.Elouise Munroe, CMA   Form completed,faxed,confirmation received and scanned into patient's chart.Maryruth Eve, Lahoma Crocker, CMA

## 2019-01-15 ENCOUNTER — Other Ambulatory Visit: Payer: Self-pay | Admitting: Family Medicine

## 2019-01-15 DIAGNOSIS — A6000 Herpesviral infection of urogenital system, unspecified: Secondary | ICD-10-CM

## 2019-02-03 ENCOUNTER — Other Ambulatory Visit: Payer: Self-pay | Admitting: Family Medicine

## 2019-02-03 DIAGNOSIS — G43701 Chronic migraine without aura, not intractable, with status migrainosus: Secondary | ICD-10-CM

## 2019-03-18 ENCOUNTER — Encounter: Payer: Self-pay | Admitting: Family Medicine

## 2019-03-18 ENCOUNTER — Ambulatory Visit (INDEPENDENT_AMBULATORY_CARE_PROVIDER_SITE_OTHER): Payer: Medicare Other | Admitting: Family Medicine

## 2019-03-18 VITALS — BP 131/67 | HR 57 | Ht 67.0 in | Wt 200.0 lb

## 2019-03-18 DIAGNOSIS — R718 Other abnormality of red blood cells: Secondary | ICD-10-CM

## 2019-03-18 DIAGNOSIS — R7309 Other abnormal glucose: Secondary | ICD-10-CM

## 2019-03-18 DIAGNOSIS — G5793 Unspecified mononeuropathy of bilateral lower limbs: Secondary | ICD-10-CM | POA: Diagnosis not present

## 2019-03-18 DIAGNOSIS — R350 Frequency of micturition: Secondary | ICD-10-CM

## 2019-03-18 DIAGNOSIS — G5790 Unspecified mononeuropathy of unspecified lower limb: Secondary | ICD-10-CM

## 2019-03-18 DIAGNOSIS — M7989 Other specified soft tissue disorders: Secondary | ICD-10-CM

## 2019-03-18 LAB — POCT URINALYSIS DIPSTICK
Bilirubin, UA: NEGATIVE
Blood, UA: NEGATIVE
Glucose, UA: NEGATIVE
Ketones, UA: NEGATIVE
Leukocytes, UA: NEGATIVE
Nitrite, UA: NEGATIVE
Protein, UA: NEGATIVE
Spec Grav, UA: 1.01 (ref 1.010–1.025)
Urobilinogen, UA: 0.2 E.U./dL
pH, UA: 7 (ref 5.0–8.0)

## 2019-03-18 LAB — POCT UA - MICROALBUMIN
Creatinine, POC: 50 mg/dL
Microalbumin Ur, POC: 10 mg/L

## 2019-03-18 LAB — POCT GLYCOSYLATED HEMOGLOBIN (HGB A1C): Hemoglobin A1C: 5.3 % (ref 4.0–5.6)

## 2019-03-18 MED ORDER — NAFTIFINE HCL 2 % EX CREA: 1.0000 "application " | TOPICAL_CREAM | Freq: Two times a day (BID) | CUTANEOUS | 11 refills | Status: DC

## 2019-03-18 NOTE — Progress Notes (Addendum)
Acute Office Visit  Subjective:    Patient ID: Brandon [name], male    DOB: 1951-02-18, 68 y.o.   MRN: [id number]  Chief Complaint  Patient presents with  . Follow-up    HPI Patient is in today for pain in his feet.  He says he feels like he is walking on pins-and-needles for at least a year if not longer.  But mo recently has had some swelling in the left great toe for the last 2 weeks. He says it was extremely painful to walk or put pressure on the toe.  He says it was red and hot and swollen.  He would try to ice it.  He said he took ibuprofen just a couple of times.  A previous provider had told him to really avoid using NSAIDs so he is mostly been sticking with Tylenol.  It still quite tender to touch today but it does feel better than it did.  He says he has had times where he is had some swollen joints in his feet but I think it was the opposite foot several years ago.  In fact he was told that he might have gout but then another provider told him that he did not have gout so he is really just not sure.  The last time he had a flare similar to this was about 7 years ago.  No prior known history of peripheral neuropathy.  He does have a history of B12 deficiency.  He denies having any redness or swelling in other joints that he has had pain in his ankles and knees as well as his back.  He also reports increased frequency of urination that ha sbeen going on for probably a month.  He denies any pain with urination.  He just feels like he goes all the time and then will feel like he is not emptying properly at all.  He does have an appoint with urology coming up next month.  He has had similar symptoms in the past.  Paternal aunt with RA.     Past Medical History:  Diagnosis Date  . ED (erectile dysfunction)   . GERD (gastroesophageal reflux disease)   . Hepatitis C    [phone], treated at [facility]  . Hyperlipidemia   . Hypertension   . Nose fracture     Past Surgical History:   Procedure Laterality Date  . FOOT SURGERY Right    1980  . FOOT SURGERY Left    1980  . INGUINAL HERNIA REPAIR Right    2009    Family History  Problem Relation Age of Onset  . Breast cancer Sister   . Depression Sister   . Hyperlipidemia Other   . Ovarian cancer Sister   . Hypertension Other   . Heart disease Brother     Social History   Socioeconomic History  . Marital status: Married    Spouse name: Oron Westrup  . Number of children: 4  . Years of education: 69  . Highest education level: Associate degree: academic program  Occupational History  . Occupation: Investment banker, corporate Rep    Comment: Dept of [employer] in [place]  . Financial resource strain: Not hard at all  . Food insecurity:    Worry: Never true    Inability: Never true  . Transportation needs:    Medical: No    Non-medical: No  Tobacco Use  . Smoking status: Former Smoker    Packs/day: 1.50  Years: 15.00    Pack years: 22.50    Types: Cigarettes    Last attempt to quit: 10/22/1996    Years since quitting: 22.4  . Smokeless tobacco: Never Used  Substance and Sexual Activity  . Alcohol use: No  . Drug use: No  . Sexual activity: Yes    Partners: Female  Lifestyle  . Physical activity:    Days per week: 0 days    Minutes per session: 0 min  . Stress: Not at all  Relationships  . Social connections:    Talks on phone: Once a week    Gets together: Never    Attends religious service: More than 4 times per year    Active member of club or organization: No    Attends meetings of clubs or organizations: Never    Relationship status: Married  . Intimate partner violence:    Fear of current or ex partner: No    Emotionally abused: No    Physically abused: No    Forced sexual activity: No  Other Topics Concern  . Not on file  Social History Narrative   No caffeine.  Has a 2 years associate degree. Previously worked at [employer] for 12 years. Also work to [employer].     Outpatient Medications Prior to Visit  Medication Sig Dispense Refill  . albuterol (PROAIR HFA) 108 (90 Base) MCG/ACT inhaler Inhale 2 puffs into the lungs every 6 (six) hours as needed for wheezing or shortness of breath. 18 g 4  . atorvastatin (LIPITOR) 20 MG tablet TAKE 1 TABLET BY MOUTH EVERY DAY 90 tablet 3  . budesonide-formoterol (SYMBICORT) 80-4.5 MCG/ACT inhaler Inhale 2 puffs into the lungs 2 (two) times daily. 1 Inhaler 0  . cetirizine (ZYRTEC) 10 MG tablet Take 1 tablet (10 mg total) by mouth daily. 90 tablet 3  . clindamycin (CLEOCIN T) 1 % lotion Apply topically 2 (two) times daily.    . clindamycin (CLEOCIN T) 1 % SWAB     . clotrimazole-betamethasone (LOTRISONE) cream Apply topically 2 (two) times daily. 45 g 3  . diclofenac sodium (VOLTAREN) 1 % GEL Apply 2 g topically 4 (four) times daily. To affected areas 400 g PRN  . DULoxetine (CYMBALTA) 30 MG capsule Take 30 mg by mouth daily. Take one capsule by mouth for 14 days then take 2 capsules daily for mood.    . fluticasone (FLONASE) 50 MCG/ACT nasal spray Place 2 sprays into both nostrils daily. 16 g 2  . hydrochlorothiazide (HYDRODIURIL) 25 MG tablet Take 1 tablet (25 mg total) by mouth daily. 90 tablet 1  . hydrocortisone (ANUSOL-HC) 25 MG suppository Place 25 mg rectally as needed. Reported on 04/19/2016    . hydrocortisone 2.5 % cream Apply topically as needed. Reported on 04/19/2016    . ipratropium (ATROVENT) 0.06 % nasal spray Place 2 sprays into both nostrils every 4 (four) hours as needed for rhinitis. 10 mL 6  . losartan (COZAAR) 100 MG tablet Take 1 tablet (100 mg total) by mouth daily. 90 tablet 1  . montelukast (SINGULAIR) 10 MG tablet TAKE 1 TABLET BY MOUTH AT BEDTIME. 30 tablet 5  . omeprazole (PRILOSEC) 40 MG capsule TAKE 1 CAPSULE BY MOUTH EVERY DAY 90 capsule 3  . sertraline (ZOLOFT) 100 MG tablet Take 1 tablet by mouth daily.    . tadalafil (CIALIS) 5 MG tablet Take 5 mg by mouth daily.  11  . tamsulosin  (FLOMAX) 0.4 MG CAPS capsule Take 0.4 mg by  mouth.    . terbinafine (LAMISIL) 1 % cream Apply 1 application topically 2 (two) times daily.    [name] topiramate (TOPAMAX) 50 MG tablet TAKE 1 TABLET (50 MG TOTAL) BY MOUTH 2 (TWO) TIMES DAILY. 180 tablet 1  . valACYclovir (VALTREX) 500 MG tablet TAKE 1 TABLET BY MOUTH EVERY DAY 30 tablet 5   No facility-administered medications prior to visit.     No Known Allergies  ROS     Objective:    Physical Exam  Constitutional: He is oriented to person, place, and time. He appears well-developed and well-nourished.  HENT:  Head: Normocephalic and atraumatic.  Eyes: Conjunctivae and EOM are normal.  Cardiovascular: Normal rate.  Pulmonary/Chest: Effort normal.  Musculoskeletal:     Comments: No lower extremity edema.  The left base of the great toe is just a little bit more swollen than the right.  It is very tender to touch but he does have normal range of motion.  No significant erythema on exam today.  No deformity of the toenails  Neurological: He is alert and oriented to person, place, and time.  Skin: Skin is dry. No pallor.  Psychiatric: He has a normal mood and affect. His behavior is normal.  Vitals reviewed.   BP 131/67   Pulse (!) 57   Ht 5\' 7"  (1.702 m)   Wt 200 lb (90.7 kg)   SpO2 100%   BMI 31.32 kg/m  Wt Readings from Last 3 Encounters:  03/18/19 200 lb (90.7 kg)  01/02/19 198 lb (89.8 kg)  12/23/18 197 lb (89.4 kg)    There are no preventive care reminders to display for this patient.  There are no preventive care reminders to display for this patient.   Lab Results  Component Value Date   TSH 0.790 10/29/2013   Lab Results  Component Value Date   WBC 5.9 02/20/2018   HGB 13.3 02/20/2018   HCT 40.8 02/20/2018   MCV 71 (L) 02/20/2018   PLT 177 09/07/2014   Lab Results  Component Value Date   NA 133 (L) 09/22/2018   K 3.8 09/22/2018   CO2 29 09/22/2018   GLUCOSE 142 (H) 09/22/2018   BUN 12 09/22/2018    CREATININE 1.13 09/22/2018   BILITOT 0.3 09/22/2018   ALKPHOS 106 02/20/2018   AST 19 09/22/2018   ALT 20 09/22/2018   PROT 6.9 09/22/2018   ALBUMIN 4.5 02/20/2018   CALCIUM 9.5 09/22/2018   Lab Results  Component Value Date   CHOL 171 01/30/2018   Lab Results  Component Value Date   HDL 31 (L) 01/30/2018   Lab Results  Component Value Date   LDLCALC 113 (H) 01/30/2018   Lab Results  Component Value Date   TRIG 155 (H) 01/30/2018   Lab Results  Component Value Date   CHOLHDL 5.5 (H) 01/30/2018   Lab Results  Component Value Date   HGBA1C 5.3 03/18/2019       Assessment & Plan:   Problem List Items Addressed This Visit    None    Visit Diagnoses    Abnormal glucose    -  Primary   Relevant Orders   CBC with Differential/Platelet   Uric acid   Sedimentation rate   C-reactive protein   Cyclic citrul peptide antibody, IgG   Rheumatoid factor   POCT glycosylated hemoglobin (Hb A1C) (Completed)   B12 and Folate Panel   Magnesium   TSH   Vitamin B6   Vitamin B1  Glucose tolerance, 3 hours   RPR   ANA   POCT UA - Microalbumin (Completed)   POCT urinalysis dipstick (Completed)   Urinary frequency       Relevant Orders   CBC with Differential/Platelet   Uric acid   Sedimentation rate   C-reactive protein   Cyclic citrul peptide antibody, IgG   Rheumatoid factor   B12 and Folate Panel   Magnesium   TSH   Vitamin B6   Vitamin B1   Glucose tolerance, 3 hours   RPR   ANA   POCT UA - Microalbumin (Completed)   POCT urinalysis dipstick (Completed)   Neuropathic pain, leg, bilateral       Relevant Orders   CBC with Differential/Platelet   Uric acid   Sedimentation rate   C-reactive protein   Cyclic citrul peptide antibody, IgG   Rheumatoid factor   B12 and Folate Panel   Magnesium   TSH   Vitamin B6   Vitamin B1   Glucose tolerance, 3 hours   RPR   ANA   Toe swelling       Relevant Orders   CBC with Differential/Platelet   Uric acid    Sedimentation rate   C-reactive protein   Cyclic citrul peptide antibody, IgG   Rheumatoid factor   B12 and Folate Panel   Magnesium   TSH   Vitamin B6   Vitamin B1   Glucose tolerance, 3 hours   RPR   ANA   Neuropathy of foot, unspecified laterality       Relevant Orders   CBC with Differential/Platelet   Uric acid   Sedimentation rate   C-reactive protein   Cyclic citrul peptide antibody, IgG   Rheumatoid factor   B12 and Folate Panel   Magnesium   TSH   Vitamin B6   Vitamin B1   Glucose tolerance, 3 hours   RPR   ANA     Abnormal glucose-looking back over his records he has had elevated blood sugar levels multiple times in the past but his A1c's have always been normal.  In fact we repeated it today especially with increased urinary frequency suspecting diabetes and it was essentially normal.  I still am concerned that he could have some underlying diabetes and so would like to schedule him for a glucose tolerance test for further work-up.  Left great toe pain and swelling-presentation is most consistent with gout.  Will check uric acid level today.  Discussed treatment with allopurinol and colchicine for 6 months and then switch him to just allopurinol alone.  We will also do additional labs to evaluate for possible rheumatoid since he does have a family history.  Neuropathy-has had pins-and-needles sensation only on the bottoms of his feet for the last year or more.  Again want to evaluate for diabetes a little bit more thoroughly as a possibility.  He is also had a prior history of B12 deficiency so we will recheck that as well.  We will also check for thyroid disorder as well as other mineral and vitamin deficiencies that could be causing his symptoms.  If work-up is negative then consider EMG studies.  Urinary frequency-urinalysis was normal except for some mild protein excretion but no sign of infection.  He does have a follow-up with urology next month.  Meds ordered  this encounter  Medications  . Naftifine HCl 2 % CREA    Sig: Apply 1 application topically 2 (two) times a day.  Dispense:  60 g    Refill:  11     Beatrice Lecher, MD

## 2019-03-19 DIAGNOSIS — G5790 Unspecified mononeuropathy of unspecified lower limb: Secondary | ICD-10-CM | POA: Diagnosis not present

## 2019-03-19 DIAGNOSIS — G5793 Unspecified mononeuropathy of bilateral lower limbs: Secondary | ICD-10-CM | POA: Diagnosis not present

## 2019-03-19 DIAGNOSIS — R350 Frequency of micturition: Secondary | ICD-10-CM | POA: Diagnosis not present

## 2019-03-19 DIAGNOSIS — R790 Abnormal level of blood mineral: Secondary | ICD-10-CM | POA: Diagnosis not present

## 2019-03-19 DIAGNOSIS — R7989 Other specified abnormal findings of blood chemistry: Secondary | ICD-10-CM | POA: Diagnosis not present

## 2019-03-19 DIAGNOSIS — M7989 Other specified soft tissue disorders: Secondary | ICD-10-CM | POA: Diagnosis not present

## 2019-03-19 DIAGNOSIS — R7309 Other abnormal glucose: Secondary | ICD-10-CM | POA: Diagnosis not present

## 2019-03-20 ENCOUNTER — Other Ambulatory Visit: Payer: Self-pay | Admitting: *Deleted

## 2019-03-20 DIAGNOSIS — R7309 Other abnormal glucose: Secondary | ICD-10-CM

## 2019-03-20 MED ORDER — METFORMIN HCL ER 500 MG PO TB24: 500.0000 mg | ORAL_TABLET | Freq: Every day | ORAL | 1 refills | Status: DC

## 2019-03-20 NOTE — Addendum Note (Signed)
Addended by: Beatrice Lecher D on: 03/20/2019 05:17 PM   Modules accepted: Orders

## 2019-03-23 ENCOUNTER — Telehealth: Payer: Self-pay

## 2019-03-23 DIAGNOSIS — R718 Other abnormality of red blood cells: Secondary | ICD-10-CM

## 2019-03-23 NOTE — Telephone Encounter (Signed)
Brandon [name] called and states he has not picked up the metformin. He wanted to know if a lifestyle change can make a difference. He would rather eat a healthy diet and exercise instead of taking medications.

## 2019-03-23 NOTE — Telephone Encounter (Signed)
K, we can hold off on the metformin for now and just really work on diet and exercise.  Really cut back on sweets and make sure portion controlling carbs.  Try to increase vegetables and lean meat.  Lets get you a glucometer so that you can start checking your morning glucose before breakfast 2-3 times per week and then let us have you come in in about 6 weeks and we can go over those numbers and take a look and see if they are improving.  I would like to test him for thalassemia.  We will need to draw it separate blood work to have this done.

## 2019-03-24 MED ORDER — LANCETS 30G MISC: 99 refills | Status: DC

## 2019-03-24 MED ORDER — ONETOUCH ULTRA 2 W/DEVICE KIT: PACK | 0 refills | Status: DC

## 2019-03-24 MED ORDER — GLUCOSE BLOOD VI STRP: ORAL_STRIP | 12 refills | Status: DC

## 2019-03-24 NOTE — Telephone Encounter (Signed)
Patient advised of recommendations. Sent I glucometer, strips and lancets.

## 2019-03-25 DIAGNOSIS — R718 Other abnormality of red blood cells: Secondary | ICD-10-CM | POA: Diagnosis not present

## 2019-03-25 LAB — CBC WITH DIFFERENTIAL/PLATELET
Absolute Monocytes: 548 cells/uL (ref 200–950)
Basophils Absolute: 59 cells/uL (ref 0–200)
Basophils Relative: 0.8 %
Eosinophils Absolute: 141 cells/uL (ref 15–500)
Eosinophils Relative: 1.9 %
HCT: 42.7 % (ref 38.5–50.0)
Hemoglobin: 14 g/dL (ref 13.2–17.1)
Lymphs Abs: 1968 cells/uL (ref 850–3900)
MCH: 24 pg — ABNORMAL LOW (ref 27.0–33.0)
MCHC: 32.8 g/dL (ref 32.0–36.0)
MCV: 73.2 fL — ABNORMAL LOW (ref 80.0–100.0)
Monocytes Relative: 7.4 %
Neutro Abs: 4684 cells/uL (ref 1500–7800)
Neutrophils Relative %: 63.3 %
Platelets: 175 10*3/uL (ref 140–400)
RBC: 5.83 10*6/uL — ABNORMAL HIGH (ref 4.20–5.80)
RDW: 18.5 % — ABNORMAL HIGH (ref 11.0–15.0)
Total Lymphocyte: 26.6 %
WBC: 7.4 10*3/uL (ref 3.8–10.8)

## 2019-03-25 LAB — IRON,TIBC AND FERRITIN PANEL
%SAT: 13 % (calc) — ABNORMAL LOW (ref 20–48)
Ferritin: 14 ng/mL — ABNORMAL LOW (ref 24–380)
Iron: 54 ug/dL (ref 50–180)
TIBC: 406 mcg/dL (calc) (ref 250–425)

## 2019-03-25 LAB — GLUCOSE TOLERANCE, 3 HOURS
Glucose Tolerance, 1 hour: 240 mg/dL
Glucose Tolerance, 2 hour: 230 mg/dL
Glucose Tolerance, Fasting: 113 mg/dL
Glucose, GTT - 3 Hour: 168 mg/dL

## 2019-03-25 LAB — B12 AND FOLATE PANEL
Folate: 13.6 ng/mL
Vitamin B-12: 414 pg/mL (ref 200–1100)

## 2019-03-25 LAB — THALASSEMIA AND HEMOGLOBINOPATHY COMPREHENSIVE EVALUATION

## 2019-03-25 LAB — RHEUMATOID FACTOR: Rhuematoid fact SerPl-aCnc: <14 IU/mL (ref <14)

## 2019-03-25 LAB — RPR: RPR Ser Ql: NONREACTIVE

## 2019-03-25 LAB — ANA: Anti Nuclear Antibody (ANA): NEGATIVE

## 2019-03-25 LAB — VITAMIN B1: Vitamin B1 (Thiamine): 8 nmol/L (ref 8–30)

## 2019-03-25 LAB — URIC ACID: Uric Acid, Serum: 6 mg/dL (ref 4.0–8.0)

## 2019-03-25 LAB — TSH: TSH: 1.76 mIU/L (ref 0.40–4.50)

## 2019-03-25 LAB — C-REACTIVE PROTEIN: CRP: 9.2 mg/L — ABNORMAL HIGH (ref <8.0)

## 2019-03-25 LAB — VITAMIN B6: Vitamin B6: 7.6 ng/mL (ref 2.1–21.7)

## 2019-03-25 LAB — CYCLIC CITRUL PEPTIDE ANTIBODY, IGG: Cyclic Citrullin Peptide Ab: <16 UNITS

## 2019-03-25 LAB — MAGNESIUM: Magnesium: 1.9 mg/dL (ref 1.5–2.5)

## 2019-03-25 LAB — SEDIMENTATION RATE: Sed Rate: 2 mm/h (ref 0–20)

## 2019-03-30 ENCOUNTER — Other Ambulatory Visit: Payer: Self-pay | Admitting: *Deleted

## 2019-03-30 DIAGNOSIS — R899 Unspecified abnormal finding in specimens from other organs, systems and tissues: Secondary | ICD-10-CM

## 2019-03-30 DIAGNOSIS — R790 Abnormal level of blood mineral: Secondary | ICD-10-CM

## 2019-04-01 LAB — HEMOGLOBINOPATHY EVALUATION
Fetal Hemoglobin Testing: 24.8 % — ABNORMAL HIGH (ref 0.0–1.9)
HCT: 43.6 % (ref 38.5–50.0)
Hemoglobin A2 - HGBRFX: 1.8 % (ref 1.8–3.5)
Hemoglobin: 13.5 g/dL (ref 13.2–17.1)
Hgb A: 73.4 % — ABNORMAL LOW (ref >96.0)
MCH: 23.7 pg — ABNORMAL LOW (ref 27.0–33.0)
MCV: 76.5 fL — ABNORMAL LOW (ref 80.0–100.0)
RBC: 5.7 10*6/uL (ref 4.20–5.80)
RDW: 18.1 % — ABNORMAL HIGH (ref 11.0–15.0)

## 2019-04-03 ENCOUNTER — Telehealth: Payer: Self-pay | Admitting: Neurology

## 2019-04-03 DIAGNOSIS — D568 Other thalassemias: Secondary | ICD-10-CM

## 2019-04-03 NOTE — Telephone Encounter (Signed)
Referral sent to hematology

## 2019-04-07 DIAGNOSIS — N401 Enlarged prostate with lower urinary tract symptoms: Secondary | ICD-10-CM | POA: Diagnosis not present

## 2019-04-07 DIAGNOSIS — N5201 Erectile dysfunction due to arterial insufficiency: Secondary | ICD-10-CM | POA: Diagnosis not present

## 2019-04-07 DIAGNOSIS — N138 Other obstructive and reflux uropathy: Secondary | ICD-10-CM | POA: Diagnosis not present

## 2019-04-08 ENCOUNTER — Other Ambulatory Visit: Payer: Self-pay | Admitting: Hematology

## 2019-04-08 DIAGNOSIS — D562 Delta-beta thalassemia: Secondary | ICD-10-CM

## 2019-04-08 NOTE — Progress Notes (Signed)
[place] CONSULT NOTE  Patient Care Team: Hali Marry, MD as PCP - General (Family Medicine) Hali Marry, MD as Consulting Physician (Family Medicine)  HEME/ONC OVERVIEW: 1. 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  Patient's baseline hemoglobin has been normal around 14.  Anemia work-up in 02/2019 showed mild iron deficiency and elevated CRP, but B12 and folate were normal.  Hemoglobin electrophoresis showed elevated persistent fetal hemoglobin (24.8%) and hemoglobin A (73.4%), suggestive of delta beta thalassemia; Hgb A2 was normal  -Hgb 14.2 with MCV 76, stable -I personally reviewed the peripheral blood smear, which showed microcytic RBC's with a few elliptocytes. There was no significant hemolysis.  -I discussed the pathophysiology of delta beta thalassemia -Delta beta thalassemia involves large deletion of the beta hemoglobin gene cluster, sparing the gamma-globin gene, causing delta beta thalassemia.  -In general, if Hgb F level is greateer than 10%, accompanied by hypochromic, microcytic RBCs and normal Hgb A2, this is likely due to the presence of delta beta thalassemia  -I reviewed the diagnostic work-up of delta beta thalassemia, which usually requires molecular studies to assess for globin abnormalities  -I discussed the pros and cons of the genetic testing, including the potential costs of the study; furthermore, even if genetic testing confirms  deletion of beta globin gene cluster, it would not change the management  -After lengthy discussions, patient decided to hold off sending the full beta gene molecular sequencing today and would like to talk to his PCP at the [place] to see if the test can be ordered there -If the test cannot be ordered at the [place], then he will call us back and send the test here; he understands that he will need repeat blood draw for the gene sequencing  -Finally, review of the recent iron profile suggests some concurrent iron deficiency; patient had EGD and colonoscopy in 2018 at [facility], which showed eosinophilic esophagitis and polyps in the colon; it was recommended that he has repeat colonoscopy in 2021; I encouraged the patient to continue follow-up with his gastroenterologist at the [facility] for age-appropriate cancer screening, including colonoscopy  No orders of the defined types were placed in this encounter.  All questions were answered. The patient knows to call the clinic with any problems, questions or concerns.  Return as needed.   Brandon Men, MD 04/16/2019 12:51 PM   CHIEF COMPLAINTS/PURPOSE OF CONSULTATION:  "I am just a little tired"  HISTORY OF PRESENTING ILLNESS:  Brandon [name] 68 y.o. male is here because of incidental detection of fetal hemoglobin, suggestive of delta beta thalassemia.  Brandon [name] reports that he has intermittent fatigue, but does not think that it interferes with his ADLs.  He was recently started on liquid iron by his PCP after labs were notable for mild iron deficiency, and he has tolerated well without any GI upset or constipation.  He is up-to-date with his colon cancer screening.  He otherwise feels well today, denies any complaint.   MEDICAL HISTORY:  Past Medical History:  Diagnosis Date  . ED (erectile dysfunction)   . GERD (gastroesophageal reflux disease)   . Hepatitis C    [phone], treated at [facility]  . Hyperlipidemia   . Hypertension   . Nose fracture      SURGICAL HISTORY: Past Surgical History:  Procedure Laterality Date  . FOOT SURGERY Right    1980  . FOOT SURGERY Left    1980  . INGUINAL HERNIA REPAIR Right    2009    SOCIAL HISTORY: Social History   Socioeconomic History  . Marital status: Married    Spouse name: Brandon [name]  . Number of children: 4  . Years of education: 36  . Highest education level: Associate degree: academic program  Occupational History  . Occupation: Investment banker, corporate Rep    Comment: Dept of [employer] in [place]  . Financial resource strain: Not hard at all  . Food insecurity    Worry: Never true    Inability: Never true  . Transportation needs    Medical: No    Non-medical: No  Tobacco Use  . Smoking status: Former Smoker    Packs/day: 1.50    Years: 15.00    Pack years: 22.50    Types: Cigarettes    Quit date: 10/22/1996    Years since quitting: 22.4  . Smokeless tobacco: Never Used  Substance and Sexual Activity  . Alcohol use: No  . Drug use: No  . Sexual activity: Yes    Partners: Female  Lifestyle  . Physical activity    Days per week: 0 days    Minutes per session: 0 min  . Stress: Not at all  Relationships  . Social Herbalist on phone: Once a week    Gets together: Never    Attends religious service: More than 4 times per year    Active member of club or organization: No    Attends meetings of clubs or organizations: Never    Relationship status: Married  . Intimate partner violence    Fear of current or ex partner: No    Emotionally abused: No    Physically abused: No    Forced sexual activity: No  Other Topics Concern  . Not on file  Social History Narrative   No caffeine.  Has a 2 years associate degree. Previously worked at [employer] for 12 years. Also work to [employer].    FAMILY HISTORY: Family History  Problem Relation Age of Onset  . Breast cancer Sister   . Depression Sister   . Hyperlipidemia Other   . Ovarian cancer Sister    . Hypertension Other   . Heart disease Brother     ALLERGIES:  has No Known Allergies.  MEDICATIONS:  Current Outpatient Medications  Medication Sig Dispense Refill  . albuterol (PROAIR HFA) 108 (90 Base) MCG/ACT inhaler Inhale 2 puffs into the lungs every 6 (six) hours as needed for wheezing or shortness of breath. 18 g 4  . Aspirin Buf,CaCarb-MgCarb-MgO, 81 MG TABS Take by mouth.    [name] atorvastatin (LIPITOR) 20 MG tablet TAKE 1 TABLET BY MOUTH EVERY DAY 90 tablet 3  . Blood Glucose Monitoring Suppl (ONE TOUCH ULTRA 2) w/Device KIT Check fasting blood sugar 2-3 times weekly. 1 each 0  . budesonide-formoterol (SYMBICORT) 80-4.5 MCG/ACT inhaler Inhale 2 puffs into the lungs 2 (two) times daily. 1 Inhaler 0  . cetirizine (ZYRTEC) 10 MG tablet Take 1 tablet (10 mg total) by mouth daily.  90 tablet 3  . clindamycin (CLEOCIN T) 1 % lotion Apply topically 2 (two) times daily.    . clindamycin (CLEOCIN T) 1 % SWAB     . clotrimazole-betamethasone (LOTRISONE) cream Apply topically 2 (two) times daily. 45 g 3  . diclofenac sodium (VOLTAREN) 1 % GEL Apply 2 g topically 4 (four) times daily. To affected areas 400 g PRN  . DULoxetine (CYMBALTA) 30 MG capsule Take 30 mg by mouth daily. Take one capsule by mouth for 14 days then take 2 capsules daily for mood.    . fluticasone (FLONASE) 50 MCG/ACT nasal spray Place 2 sprays into both nostrils daily. 16 g 2  . glucose blood (ONETOUCH ULTRA) test strip Check fasting blood sugar 2-3 times weekly. 100 each 12  . hydrochlorothiazide (HYDRODIURIL) 25 MG tablet Take 1 tablet (25 mg total) by mouth daily. 90 tablet 1  . hydrocortisone (ANUSOL-HC) 25 MG suppository Place 25 mg rectally as needed. Reported on 04/19/2016    . hydrocortisone 2.5 % cream Apply topically as needed. Reported on 04/19/2016    . ipratropium (ATROVENT) 0.06 % nasal spray Place 2 sprays into both nostrils every 4 (four) hours as needed for rhinitis. 10 mL 6  . Lancets 30G MISC Check fasting  blood sugar 2-3 times weekly. 100 each prn  . losartan (COZAAR) 100 MG tablet Take 1 tablet (100 mg total) by mouth daily. 90 tablet 1  . losartan (COZAAR) 50 MG tablet     . metFORMIN (GLUCOPHAGE XR) 500 MG 24 hr tablet Take 1 tablet (500 mg total) by mouth daily with breakfast. 90 tablet 1  . montelukast (SINGULAIR) 10 MG tablet TAKE 1 TABLET BY MOUTH AT BEDTIME. 30 tablet 5  . Naftifine HCl 2 % CREA Apply 1 application topically 2 (two) times a day. 60 g 11  . omeprazole (PRILOSEC) 40 MG capsule TAKE 1 CAPSULE BY MOUTH EVERY DAY 90 capsule 3  . sertraline (ZOLOFT) 100 MG tablet Take 1 tablet by mouth daily.    . tadalafil (CIALIS) 5 MG tablet Take 5 mg by mouth daily.  11  . tamsulosin (FLOMAX) 0.4 MG CAPS capsule Take 0.4 mg by mouth.    . terbinafine (LAMISIL) 1 % cream Apply 1 application topically 2 (two) times daily.    [name] topiramate (TOPAMAX) 50 MG tablet TAKE 1 TABLET (50 MG TOTAL) BY MOUTH 2 (TWO) TIMES DAILY. 180 tablet 1  . valACYclovir (VALTREX) 500 MG tablet TAKE 1 TABLET BY MOUTH EVERY DAY 30 tablet 5   No current facility-administered medications for this visit.     REVIEW OF SYSTEMS:   Constitutional: ( - ) fevers, ( - )  chills , ( - ) night sweats Eyes: ( - ) blurriness of vision, ( - ) double vision, ( - ) watery eyes Ears, nose, mouth, throat, and face: ( - ) mucositis, ( - ) sore throat Respiratory: ( - ) cough, ( - ) dyspnea, ( - ) wheezes Cardiovascular: ( - ) palpitation, ( - ) chest discomfort, ( - ) lower extremity swelling Gastrointestinal:  ( - ) nausea, ( - ) heartburn, ( - ) change in bowel habits Skin: ( - ) abnormal skin rashes Lymphatics: ( - ) new lymphadenopathy, ( - ) easy bruising Neurological: ( - ) numbness, ( - ) tingling, ( - ) new weaknesses Behavioral/Psych: ( - ) mood change, ( - ) new changes  All other systems were reviewed with the patient and are negative.  PHYSICAL EXAMINATION: ECOG PERFORMANCE STATUS: 0 - Asymptomatic  Vitals:    04/16/19 1150  BP: (!) 141/71  Pulse: 61  Resp: 18  SpO2: 99%   Filed Weights   04/16/19 1150  Weight: 191 lb 12.8 oz (87 kg)    GENERAL: alert, no distress and comfortable SKIN: skin color, texture, turgor are normal, no rashes or significant lesions EYES: conjunctiva are pink and non-injected, sclera clear OROPHARYNX: no exudate, no erythema; lips, buccal mucosa, and tongue normal  NECK: supple, non-tender LYMPH:  no palpable lymphadenopathy in the cervical LUNGS: clear to auscultation with normal breathing effort HEART: regular rate & rhythm, no murmurs, no lower extremity edema ABDOMEN: soft, non-tender, non-distended, normal bowel sounds Musculoskeletal: no cyanosis of digits and no clubbing  PSYCH: alert & oriented x 3, fluent speech NEURO: no focal motor/sensory deficits  LABORATORY DATA:  I have reviewed the data as listed Lab Results  Component Value Date   WBC 6.8 04/16/2019   HGB 14.2 04/16/2019   HCT 44.1 04/16/2019   MCV 76.3 (L) 04/16/2019   PLT 160 04/16/2019   Lab Results  Component Value Date   NA 138 04/16/2019   K 3.6 04/16/2019   CL 101 04/16/2019   CO2 29 04/16/2019   PATHOLOGY: I personally reviewed the patient's peripheral blood smear today.  The red blood cells were microcytic with a few elliptocytes.  There was no schistocytosis.  The white blood cells were of normal morphology. There were no peripheral circulating blasts. The platelets were of normal size and I verified that there were no platelet clumping.

## 2019-04-09 ENCOUNTER — Other Ambulatory Visit: Payer: Self-pay | Admitting: Hematology

## 2019-04-09 ENCOUNTER — Telehealth: Payer: Self-pay | Admitting: *Deleted

## 2019-04-09 DIAGNOSIS — D562 Delta-beta thalassemia: Secondary | ICD-10-CM

## 2019-04-13 ENCOUNTER — Encounter: Payer: Self-pay | Admitting: *Deleted

## 2019-04-13 NOTE — Telephone Encounter (Signed)
Pt is asking that Dr. Madilyn Fireman write a letter for him regarding his headaches and chronic sinusitis. Maryruth Eve, [place] Crocker, CMA

## 2019-04-13 NOTE — Telephone Encounter (Signed)
Pt advised that letter is ready. He will come by to p/u.Marland KitchenElouise Munroe, [place]

## 2019-04-15 ENCOUNTER — Ambulatory Visit: Payer: Medicare Other | Admitting: Family Medicine

## 2019-04-16 ENCOUNTER — Other Ambulatory Visit: Payer: Self-pay | Admitting: Hematology

## 2019-04-16 ENCOUNTER — Inpatient Hospital Stay: Payer: Medicare Other

## 2019-04-16 ENCOUNTER — Encounter: Payer: Self-pay | Admitting: Hematology

## 2019-04-16 ENCOUNTER — Other Ambulatory Visit: Payer: Self-pay

## 2019-04-16 ENCOUNTER — Inpatient Hospital Stay: Payer: Medicare Other | Attending: Hematology | Admitting: Hematology

## 2019-04-16 DIAGNOSIS — E785 Hyperlipidemia, unspecified: Secondary | ICD-10-CM

## 2019-04-16 DIAGNOSIS — Z8041 Family history of malignant neoplasm of ovary: Secondary | ICD-10-CM | POA: Insufficient documentation

## 2019-04-16 DIAGNOSIS — N529 Male erectile dysfunction, unspecified: Secondary | ICD-10-CM | POA: Insufficient documentation

## 2019-04-16 DIAGNOSIS — R7982 Elevated C-reactive protein (CRP): Secondary | ICD-10-CM | POA: Diagnosis not present

## 2019-04-16 DIAGNOSIS — Z803 Family history of malignant neoplasm of breast: Secondary | ICD-10-CM | POA: Insufficient documentation

## 2019-04-16 DIAGNOSIS — R5383 Other fatigue: Secondary | ICD-10-CM | POA: Diagnosis not present

## 2019-04-16 DIAGNOSIS — D562 Delta-beta thalassemia: Secondary | ICD-10-CM | POA: Diagnosis not present

## 2019-04-16 DIAGNOSIS — Z8619 Personal history of other infectious and parasitic diseases: Secondary | ICD-10-CM | POA: Diagnosis not present

## 2019-04-16 DIAGNOSIS — I10 Essential (primary) hypertension: Secondary | ICD-10-CM | POA: Insufficient documentation

## 2019-04-16 DIAGNOSIS — Z79899 Other long term (current) drug therapy: Secondary | ICD-10-CM | POA: Diagnosis not present

## 2019-04-16 DIAGNOSIS — K219 Gastro-esophageal reflux disease without esophagitis: Secondary | ICD-10-CM | POA: Diagnosis not present

## 2019-04-16 DIAGNOSIS — D509 Iron deficiency anemia, unspecified: Secondary | ICD-10-CM | POA: Diagnosis not present

## 2019-04-16 DIAGNOSIS — Z87891 Personal history of nicotine dependence: Secondary | ICD-10-CM | POA: Insufficient documentation

## 2019-04-16 DIAGNOSIS — Z7982 Long term (current) use of aspirin: Secondary | ICD-10-CM | POA: Diagnosis not present

## 2019-04-16 LAB — CMP (CANCER CENTER ONLY)
ALT: 22 U/L (ref 0–44)
AST: 18 U/L (ref 15–41)
Albumin: 4.5 g/dL (ref 3.5–5.0)
Alkaline Phosphatase: 93 U/L (ref 38–126)
Anion gap: 8 (ref 5–15)
BUN: 14 mg/dL (ref 8–23)
CO2: 29 mmol/L (ref 22–32)
Calcium: 10 mg/dL (ref 8.9–10.3)
Chloride: 101 mmol/L (ref 98–111)
Creatinine: 1.13 mg/dL (ref 0.61–1.24)
GFR, Est AFR Am: >60 mL/min (ref >60)
GFR, Estimated: >60 mL/min (ref >60)
Glucose, Bld: 97 mg/dL (ref 70–99)
Potassium: 3.6 mmol/L (ref 3.5–5.1)
Sodium: 138 mmol/L (ref 135–145)
Total Bilirubin: 0.6 mg/dL (ref 0.3–1.2)
Total Protein: 7 g/dL (ref 6.5–8.1)

## 2019-04-16 LAB — CBC WITH DIFFERENTIAL (CANCER CENTER ONLY)
Abs Immature Granulocytes: 0.01 10*3/uL (ref 0.00–0.07)
Basophils Absolute: 0.1 10*3/uL (ref 0.0–0.1)
Basophils Relative: 1 %
Eosinophils Absolute: 0.2 10*3/uL (ref 0.0–0.5)
Eosinophils Relative: 2 %
HCT: 44.1 % (ref 39.0–52.0)
Hemoglobin: 14.2 g/dL (ref 13.0–17.0)
Immature Granulocytes: 0 %
Lymphocytes Relative: 26 %
Lymphs Abs: 1.8 10*3/uL (ref 0.7–4.0)
MCH: 24.6 pg — ABNORMAL LOW (ref 26.0–34.0)
MCHC: 32.2 g/dL (ref 30.0–36.0)
MCV: 76.3 fL — ABNORMAL LOW (ref 80.0–100.0)
Monocytes Absolute: 0.4 10*3/uL (ref 0.1–1.0)
Monocytes Relative: 6 %
Neutro Abs: 4.4 10*3/uL (ref 1.7–7.7)
Neutrophils Relative %: 65 %
Platelet Count: 160 10*3/uL (ref 150–400)
RBC: 5.78 MIL/uL (ref 4.22–5.81)
RDW: 17.2 % — ABNORMAL HIGH (ref 11.5–15.5)
WBC Count: 6.8 10*3/uL (ref 4.0–10.5)
nRBC: 0 % (ref 0.0–0.2)

## 2019-04-16 LAB — SAVE SMEAR(SSMR), FOR PROVIDER SLIDE REVIEW

## 2019-04-16 NOTE — Progress Notes (Unsigned)
m °

## 2019-05-04 ENCOUNTER — Other Ambulatory Visit: Payer: Self-pay

## 2019-05-04 ENCOUNTER — Encounter: Payer: Self-pay | Admitting: Family Medicine

## 2019-05-04 ENCOUNTER — Ambulatory Visit (INDEPENDENT_AMBULATORY_CARE_PROVIDER_SITE_OTHER): Payer: Medicare Other | Admitting: Family Medicine

## 2019-05-04 VITALS — BP 136/78 | HR 68 | Ht 67.0 in | Wt 189.0 lb

## 2019-05-04 DIAGNOSIS — E119 Type 2 diabetes mellitus without complications: Secondary | ICD-10-CM | POA: Insufficient documentation

## 2019-05-04 DIAGNOSIS — H9202 Otalgia, left ear: Secondary | ICD-10-CM

## 2019-05-04 DIAGNOSIS — I10 Essential (primary) hypertension: Secondary | ICD-10-CM | POA: Diagnosis not present

## 2019-05-04 MED ORDER — LOSARTAN POTASSIUM 50 MG PO TABS: 50.0000 mg | ORAL_TABLET | Freq: Two times a day (BID) | ORAL | 1 refills | Status: DC

## 2019-05-04 MED ORDER — HYDROCHLOROTHIAZIDE 25 MG PO TABS: 25.0000 mg | ORAL_TABLET | Freq: Every day | ORAL | 1 refills | Status: DC

## 2019-05-04 NOTE — Progress Notes (Signed)
BS:134.Brandon [name], Brandon [name], CMA

## 2019-05-04 NOTE — Assessment & Plan Note (Signed)
Brought in home glucose log most of the blood sugars have been between 118 and 135.  The very first day he took at home and tested it was 160 but that was right before he had started making some major dietary and exercise changes.  He has done a fantastic job on lifestyle changes and just encouraged him to keep it up.  If we can keep his glucose levels under 130 then we can hold off on starting the metformin.  Follow-up in 3 months.

## 2019-05-04 NOTE — Assessment & Plan Note (Signed)
Pressures okay.  I really love to see the systolic less than 648.

## 2019-05-04 NOTE — Progress Notes (Signed)
Established Patient Office Visit  Subjective:  Patient ID: Brandon [name], male    DOB: 1951-01-29  Age: 68 y.o. MRN: [id number]  CC:  Chief Complaint  Patient presents with  . abnormal glucose    HPI Sigurd Pugh presents for   Follow-up for new diagnosis of prediabetes-he had several fasting blood sugars that were elevated but yet his A1c continue to look fantastic.  Eventually we had him evaluated and tested for a hemoglobinopathy which she does have.  And so we have been tracking his home glucose levels he did bring in his meter today.  He is really worked on making some changes he has been following the eating plan that he was given.  He is lost actually some weight and he was walking daily, 5 days/week for a while that he has slacked off a little bit on the exercise but says he plans on getting back on track.  In regards to the hemoglobinopathy we referred him to hematology.  He has been diagnosed with delta beta thalassemia as well as some iron deficiency.  He is getting genetic testing done through the [place].  Also having some pain in his left ear has had a lot of sinus headaches recently but says over the weekend the ear pain a little better but he wants me to take a look and just make sure notes anything.  He said is that we will had his wife look at it looked little bit red.  No fevers chills or sweats no drainage from the ear.   Past Medical History:  Diagnosis Date  . ED (erectile dysfunction)   . GERD (gastroesophageal reflux disease)   . Hepatitis C    [phone], treated at [facility]  . Hyperlipidemia   . Hypertension   . Nose fracture     Past Surgical History:  Procedure Laterality Date  . FOOT SURGERY Right    1980  . FOOT SURGERY Left    1980  . INGUINAL HERNIA REPAIR Right    2009    Family History  Problem Relation Age of Onset  . Breast cancer Sister   . Depression Sister   . Hyperlipidemia Other   . Ovarian cancer Sister   . Hypertension  Other   . Heart disease Brother     Social History   Socioeconomic History  . Marital status: Married    Spouse name: Jordyn Doane  . Number of children: 4  . Years of education: 28  . Highest education level: Associate degree: academic program  Occupational History  . Occupation: Investment banker, corporate Rep    Comment: Dept of [employer] in [place]  . Financial resource strain: Not hard at all  . Food insecurity    Worry: Never true    Inability: Never true  . Transportation needs    Medical: No    Non-medical: No  Tobacco Use  . Smoking status: Former Smoker    Packs/day: 1.50    Years: 15.00    Pack years: 22.50    Types: Cigarettes    Quit date: 10/22/1996    Years since quitting: 22.5  . Smokeless tobacco: Never Used  Substance and Sexual Activity  . Alcohol use: No  . Drug use: No  . Sexual activity: Yes    Partners: Female  Lifestyle  . Physical activity    Days per week: 0 days    Minutes per session: 0 min  . Stress: Not at all  Relationships  . Social Herbalist on phone: Once a week    Gets together: Never    Attends religious service: More than 4 times per year    Active member of club or organization: No    Attends meetings of clubs or organizations: Never    Relationship status: Married  . Intimate partner violence    Fear of current or ex partner: No    Emotionally abused: No    Physically abused: No    Forced sexual activity: No  Other Topics Concern  . Not on file  Social History Narrative   No caffeine.  Has a 2 years associate degree. Previously worked at [employer] for 12 years. Also work to [employer].    Outpatient Medications Prior to Visit  Medication Sig Dispense Refill  . albuterol (PROAIR HFA) 108 (90 Base) MCG/ACT inhaler Inhale 2 puffs into the lungs every 6 (six) hours as needed for wheezing or shortness of breath. 18 g 4  . atorvastatin (LIPITOR) 20 MG tablet TAKE 1 TABLET BY MOUTH EVERY DAY 90 tablet 3  . Blood  Glucose Monitoring Suppl (ONE TOUCH ULTRA 2) w/Device KIT Check fasting blood sugar 2-3 times weekly. 1 each 0  . budesonide-formoterol (SYMBICORT) 80-4.5 MCG/ACT inhaler Inhale 2 puffs into the lungs 2 (two) times daily. 1 Inhaler 0  . cetirizine (ZYRTEC) 10 MG tablet Take 1 tablet (10 mg total) by mouth daily. 90 tablet 3  . clindamycin (CLEOCIN T) 1 % lotion Apply topically 2 (two) times daily.    . clindamycin (CLEOCIN T) 1 % SWAB     . clotrimazole-betamethasone (LOTRISONE) cream Apply topically 2 (two) times daily. 45 g 3  . diclofenac sodium (VOLTAREN) 1 % GEL Apply 2 g topically 4 (four) times daily. To affected areas 400 g PRN  . DULoxetine (CYMBALTA) 30 MG capsule Take 30 mg by mouth daily. Take one capsule by mouth for 14 days then take 2 capsules daily for mood.    . fluticasone (FLONASE) 50 MCG/ACT nasal spray Place 2 sprays into both nostrils daily. 16 g 2  . glucose blood (ONETOUCH ULTRA) test strip Check fasting blood sugar 2-3 times weekly. 100 each 12  . hydrocortisone (ANUSOL-HC) 25 MG suppository Place 25 mg rectally as needed. Reported on 04/19/2016    . hydrocortisone 2.5 % cream Apply topically as needed. Reported on 04/19/2016    . ipratropium (ATROVENT) 0.06 % nasal spray Place 2 sprays into both nostrils every 4 (four) hours as needed for rhinitis. 10 mL 6  . Lancets 30G MISC Check fasting blood sugar 2-3 times weekly. 100 each prn  . montelukast (SINGULAIR) 10 MG tablet TAKE 1 TABLET BY MOUTH AT BEDTIME. 30 tablet 5  . Naftifine HCl 2 % CREA Apply 1 application topically 2 (two) times a day. 60 g 11  . omeprazole (PRILOSEC) 40 MG capsule TAKE 1 CAPSULE BY MOUTH EVERY DAY 90 capsule 3  . sertraline (ZOLOFT) 100 MG tablet Take 1 tablet by mouth daily.    . tadalafil (CIALIS) 5 MG tablet Take 5 mg by mouth daily.  11  . tamsulosin (FLOMAX) 0.4 MG CAPS capsule Take 0.4 mg by mouth.    . terbinafine (LAMISIL) 1 % cream Apply 1 application topically 2 (two) times daily.    [name]  topiramate (TOPAMAX) 50 MG tablet TAKE 1 TABLET (50 MG TOTAL) BY MOUTH 2 (TWO) TIMES DAILY. 180 tablet 1  . valACYclovir (VALTREX) 500 MG tablet TAKE 1 TABLET  BY MOUTH EVERY DAY 30 tablet 5  . hydrochlorothiazide (HYDRODIURIL) 25 MG tablet Take 1 tablet (25 mg total) by mouth daily. 90 tablet 1  . losartan (COZAAR) 50 MG tablet     . metFORMIN (GLUCOPHAGE XR) 500 MG 24 hr tablet Take 1 tablet (500 mg total) by mouth daily with breakfast. 90 tablet 1  . Aspirin Buf,CaCarb-MgCarb-MgO, 81 MG TABS Take by mouth.    . losartan (COZAAR) 100 MG tablet Take 1 tablet (100 mg total) by mouth daily. 90 tablet 1   No facility-administered medications prior to visit.     No Known Allergies  ROS Review of Systems    Objective:    Physical Exam  Constitutional: He is oriented to person, place, and time. He appears well-developed and well-nourished.  HENT:  Head: Normocephalic and atraumatic.  TMs and canals are clear bilaterally.  Eyes: Conjunctivae are normal.  Neck: Neck supple. No thyromegaly present.  Cardiovascular: Normal rate, regular rhythm and normal heart sounds.  Pulmonary/Chest: Effort normal and breath sounds normal.  Neurological: He is alert and oriented to person, place, and time.  Skin: Skin is warm and dry.  Psychiatric: He has a normal mood and affect. His behavior is normal.    BP 136/78   Pulse 68   Ht _0  (1.702 m)   Wt 189 lb (85.7 kg)   SpO2 98%   BMI 29.60 kg/m  Wt Readings from Last 3 Encounters:  05/04/19 189 lb (85.7 kg)  04/16/19 191 lb 12.8 oz (87 kg)  03/18/19 200 lb (90.7 kg)     Health Maintenance Due  Topic Date Due  . FOOT EXAM  04/21/1960  . OPHTHALMOLOGY EXAM  04/21/1960    There are no preventive care reminders to display for this patient.  Lab Results  Component Value Date   TSH 1.76 03/19/2019   Lab Results  Component Value Date   WBC 6.8 04/16/2019   HGB 14.2 04/16/2019   HCT 44.1 04/16/2019   MCV 76.3 (L) 04/16/2019   PLT  160 04/16/2019   Lab Results  Component Value Date   NA 138 04/16/2019   K 3.6 04/16/2019   CO2 29 04/16/2019   GLUCOSE 97 04/16/2019   BUN 14 04/16/2019   CREATININE 1.13 04/16/2019   BILITOT 0.6 04/16/2019   ALKPHOS 93 04/16/2019   AST 18 04/16/2019   ALT 22 04/16/2019   PROT 7.0 04/16/2019   ALBUMIN 4.5 04/16/2019   CALCIUM 10.0 04/16/2019   ANIONGAP 8 04/16/2019   Lab Results  Component Value Date   CHOL 171 01/30/2018   Lab Results  Component Value Date   HDL 31 (L) 01/30/2018   Lab Results  Component Value Date   LDLCALC 113 (H) 01/30/2018   Lab Results  Component Value Date   TRIG 155 (H) 01/30/2018   Lab Results  Component Value Date   CHOLHDL 5.5 (H) 01/30/2018   Lab Results  Component Value Date   HGBA1C 5.3 03/18/2019      Assessment & Plan:   Problem List Items Addressed This Visit      Cardiovascular and Mediastinum   Essential hypertension, benign - Primary    Pressures okay.  I really love to see the systolic less than 675.      Relevant Medications   hydrochlorothiazide (HYDRODIURIL) 25 MG tablet   losartan (COZAAR) 50 MG tablet     Endocrine   Controlled type 2 diabetes mellitus without complication, without long-term current use  of insulin ([place])    Brought in home glucose log most of the blood sugars have been between 118 and 135.  The very first day he took at home and tested it was 160 but that was right before he had started making some major dietary and exercise changes.  He has done a fantastic job on lifestyle changes and just encouraged him to keep it up.  If we can keep his glucose levels under 130 then we can hold off on starting the metformin.  Follow-up in 3 months.      Relevant Medications   losartan (COZAAR) 50 MG tablet    Other Visit Diagnoses    Left ear pain         Left ear pain-exam is normal today suspect is probably a little eustachian tube dysfunction.  He says it did actually get a little better over the  weekend so just continue to monitor it this week if it suddenly gets worse or if he notices any drainage or increased pain let me know.  Meds ordered this encounter  Medications  . hydrochlorothiazide (HYDRODIURIL) 25 MG tablet    Sig: Take 1 tablet (25 mg total) by mouth daily.    Dispense:  90 tablet    Refill:  1  . losartan (COZAAR) 50 MG tablet    Sig: Take 1 tablet (50 mg total) by mouth 2 (two) times a day.    Dispense:  360 tablet    Refill:  1    Follow-up: Return in about 3 months (around 08/04/2019) for Diabetes follow-up.    Beatrice Lecher, MD

## 2019-05-09 ENCOUNTER — Other Ambulatory Visit: Payer: Self-pay | Admitting: Family Medicine

## 2019-05-09 DIAGNOSIS — A6000 Herpesviral infection of urogenital system, unspecified: Secondary | ICD-10-CM

## 2019-07-14 ENCOUNTER — Ambulatory Visit (INDEPENDENT_AMBULATORY_CARE_PROVIDER_SITE_OTHER): Payer: Medicare Other | Admitting: Family Medicine

## 2019-07-14 ENCOUNTER — Other Ambulatory Visit: Payer: Self-pay

## 2019-07-14 ENCOUNTER — Encounter: Payer: Self-pay | Admitting: Family Medicine

## 2019-07-14 VITALS — BP 129/54 | HR 68 | Ht 67.0 in | Wt 180.0 lb

## 2019-07-14 DIAGNOSIS — R3911 Hesitancy of micturition: Secondary | ICD-10-CM | POA: Diagnosis not present

## 2019-07-14 DIAGNOSIS — R1013 Epigastric pain: Secondary | ICD-10-CM | POA: Diagnosis not present

## 2019-07-14 DIAGNOSIS — R109 Unspecified abdominal pain: Secondary | ICD-10-CM

## 2019-07-14 LAB — POCT URINALYSIS DIPSTICK
Bilirubin, UA: NEGATIVE
Blood, UA: NEGATIVE
Glucose, UA: NEGATIVE
Ketones, UA: NEGATIVE
Leukocytes, UA: NEGATIVE
Nitrite, UA: NEGATIVE
Protein, UA: NEGATIVE
Spec Grav, UA: 1.01 (ref 1.010–1.025)
Urobilinogen, UA: 0.2 E.U./dL
pH, UA: 7 (ref 5.0–8.0)

## 2019-07-14 NOTE — Progress Notes (Signed)
Acute Office Visit  Subjective:    Patient ID: Brandon [name], male    DOB: 12-19-1950, 68 y.o.   MRN: [id number]  Chief Complaint  Patient presents with  . Back Pain    HPI Patient is in today for right sided flank/mid back pain.  Says started 5 days ago with epigastric pain and then started hurting in the right flank area.  Pain radiates to his side.  Pain 10/10, almost went to the ED. No x of kidney stones. No blood in the urine.  No fever, sweats or chills. No N/V/D.  He felt like in the last couple of days he was having to strain to empty his bladder little bit and did have a little bit of discomfort with urination.  He says that he often takes Tylenol for musculoskeletal pain and did not notice a difference in regards to his flank pain when he took it.  He describes the pain as "deep".  He says it did seem to wax and wane in intensity.  He says he woke up today actually feeling great.  His pain is significantly improved he says a little bit of epigastric discomfort.  He did want to let me know that recently his vitamin B1 supplement change she is actually taking a lower strength of 100 mg and is still taking an iron supplement.  Does have constipation and uses Miralax PRN from [place].    Past Medical History:  Diagnosis Date  . ED (erectile dysfunction)   . GERD (gastroesophageal reflux disease)   . Hepatitis C    [phone], treated at [facility]  . Hyperlipidemia   . Hypertension   . Nose fracture     Past Surgical History:  Procedure Laterality Date  . FOOT SURGERY Right    1980  . FOOT SURGERY Left    1980  . INGUINAL HERNIA REPAIR Right    2009    Family History  Problem Relation Age of Onset  . Breast cancer Sister   . Depression Sister   . Hyperlipidemia Other   . Ovarian cancer Sister   . Hypertension Other   . Heart disease Brother     Social History   Socioeconomic History  . Marital status: Married    Spouse name: Srihari Shellhammer  . Number of  children: 4  . Years of education: 9  . Highest education level: Associate degree: academic program  Occupational History  . Occupation: Investment banker, corporate Rep    Comment: Dept of [employer] in [place]  . Financial resource strain: Not hard at all  . Food insecurity    Worry: Never true    Inability: Never true  . Transportation needs    Medical: No    Non-medical: No  Tobacco Use  . Smoking status: Former Smoker    Packs/day: 1.50    Years: 15.00    Pack years: 22.50    Types: Cigarettes    Quit date: 10/22/1996    Years since quitting: 22.7  . Smokeless tobacco: Never Used  Substance and Sexual Activity  . Alcohol use: No  . Drug use: No  . Sexual activity: Yes    Partners: Female  Lifestyle  . Physical activity    Days per week: 0 days    Minutes per session: 0 min  . Stress: Not at all  Relationships  . Social connections    Talks on phone: Once a week    Gets together: Never  Attends religious service: More than 4 times per year    Active member of club or organization: No    Attends meetings of clubs or organizations: Never    Relationship status: Married  . Intimate partner violence    Fear of current or ex partner: No    Emotionally abused: No    Physically abused: No    Forced sexual activity: No  Other Topics Concern  . Not on file  Social History Narrative   No caffeine.  Has a 2 years associate degree. Previously worked at [employer] for 12 years. Also work to [employer].    Outpatient Medications Prior to Visit  Medication Sig Dispense Refill  . albuterol (PROAIR HFA) 108 (90 Base) MCG/ACT inhaler Inhale 2 puffs into the lungs every 6 (six) hours as needed for wheezing or shortness of breath. 18 g 4  . atorvastatin (LIPITOR) 20 MG tablet TAKE 1 TABLET BY MOUTH EVERY DAY 90 tablet 3  . Blood Glucose Monitoring Suppl (ONE TOUCH ULTRA 2) w/Device KIT Check fasting blood sugar 2-3 times weekly. 1 each 0  . budesonide-formoterol (SYMBICORT) 80-4.5  MCG/ACT inhaler Inhale 2 puffs into the lungs 2 (two) times daily. 1 Inhaler 0  . cetirizine (ZYRTEC) 10 MG tablet Take 1 tablet (10 mg total) by mouth daily. 90 tablet 3  . clindamycin (CLEOCIN T) 1 % lotion Apply topically 2 (two) times daily.    . clindamycin (CLEOCIN T) 1 % SWAB     . clotrimazole-betamethasone (LOTRISONE) cream Apply topically 2 (two) times daily. 45 g 3  . diclofenac sodium (VOLTAREN) 1 % GEL Apply 2 g topically 4 (four) times daily. To affected areas 400 g PRN  . DULoxetine (CYMBALTA) 30 MG capsule Take 30 mg by mouth daily. Take one capsule by mouth for 14 days then take 2 capsules daily for mood.    . fluticasone (FLONASE) 50 MCG/ACT nasal spray Place 2 sprays into both nostrils daily. 16 g 2  . glucose blood (ONETOUCH ULTRA) test strip Check fasting blood sugar 2-3 times weekly. 100 each 12  . hydrochlorothiazide (HYDRODIURIL) 25 MG tablet Take 1 tablet (25 mg total) by mouth daily. 90 tablet 1  . hydrocortisone (ANUSOL-HC) 25 MG suppository Place 25 mg rectally as needed. Reported on 04/19/2016    . hydrocortisone 2.5 % cream Apply topically as needed. Reported on 04/19/2016    . ipratropium (ATROVENT) 0.06 % nasal spray Place 2 sprays into both nostrils every 4 (four) hours as needed for rhinitis. 10 mL 6  . Lancets 30G MISC Check fasting blood sugar 2-3 times weekly. 100 each prn  . losartan (COZAAR) 50 MG tablet Take 1 tablet (50 mg total) by mouth 2 (two) times a day. 360 tablet 1  . montelukast (SINGULAIR) 10 MG tablet TAKE 1 TABLET BY MOUTH AT BEDTIME. 30 tablet 5  . Naftifine HCl 2 % CREA Apply 1 application topically 2 (two) times a day. 60 g 11  . omeprazole (PRILOSEC) 40 MG capsule TAKE 1 CAPSULE BY MOUTH EVERY DAY 90 capsule 3  . sertraline (ZOLOFT) 100 MG tablet Take 1 tablet by mouth daily.    . tadalafil (CIALIS) 5 MG tablet Take 5 mg by mouth daily.  11  . tamsulosin (FLOMAX) 0.4 MG CAPS capsule Take 0.4 mg by mouth.    . terbinafine (LAMISIL) 1 % cream  Apply 1 application topically 2 (two) times daily.    [name] topiramate (TOPAMAX) 50 MG tablet TAKE 1 TABLET (50 MG TOTAL)  BY MOUTH 2 (TWO) TIMES DAILY. 180 tablet 1  . valACYclovir (VALTREX) 500 MG tablet TAKE 1 TABLET BY MOUTH EVERY DAY 30 tablet 5   No facility-administered medications prior to visit.     No Known Allergies  ROS     Objective:    Physical Exam  Constitutional: He is oriented to person, place, and time. He appears well-developed and well-nourished.  HENT:  Head: Normocephalic and atraumatic.  Cardiovascular: Normal rate, regular rhythm and normal heart sounds.  Pulmonary/Chest: Effort normal and breath sounds normal.  Abdominal: Soft. Bowel sounds are normal. He exhibits no distension and no mass. There is abdominal tenderness. There is no rebound and no guarding.  TTP in the epigastric and RUQ  Musculoskeletal:     Comments: NO CVA tenderness.   Neurological: He is alert and oriented to person, place, and time.  Skin: Skin is warm and dry.  Psychiatric: He has a normal mood and affect. His behavior is normal.    BP (!) 129/54   Pulse 68   Ht 5' 7"  (1.702 m)   Wt 180 lb (81.6 kg)   SpO2 100%   BMI 28.19 kg/m  Wt Readings from Last 3 Encounters:  07/14/19 180 lb (81.6 kg)  05/04/19 189 lb (85.7 kg)  04/16/19 191 lb 12.8 oz (87 kg)    Health Maintenance Due  Topic Date Due  . FOOT EXAM  04/21/1960  . OPHTHALMOLOGY EXAM  04/21/1960    There are no preventive care reminders to display for this patient.   Lab Results  Component Value Date   TSH 1.76 03/19/2019   Lab Results  Component Value Date   WBC 6.8 04/16/2019   HGB 14.2 04/16/2019   HCT 44.1 04/16/2019   MCV 76.3 (L) 04/16/2019   PLT 160 04/16/2019   Lab Results  Component Value Date   NA 138 04/16/2019   K 3.6 04/16/2019   CO2 29 04/16/2019   GLUCOSE 97 04/16/2019   BUN 14 04/16/2019   CREATININE 1.13 04/16/2019   BILITOT 0.6 04/16/2019   ALKPHOS 93 04/16/2019   AST 18  04/16/2019   ALT 22 04/16/2019   PROT 7.0 04/16/2019   ALBUMIN 4.5 04/16/2019   CALCIUM 10.0 04/16/2019   ANIONGAP 8 04/16/2019   Lab Results  Component Value Date   CHOL 171 01/30/2018   Lab Results  Component Value Date   HDL 31 (L) 01/30/2018   Lab Results  Component Value Date   LDLCALC 113 (H) 01/30/2018   Lab Results  Component Value Date   TRIG 155 (H) 01/30/2018   Lab Results  Component Value Date   CHOLHDL 5.5 (H) 01/30/2018   Lab Results  Component Value Date   HGBA1C 5.3 03/18/2019       Assessment & Plan:   Problem List Items Addressed This Visit    None    Visit Diagnoses    Urinary hesitancy    -  Primary   Relevant Orders   POCT urinalysis dipstick (Completed)   US Abdomen Complete   Epigastric pain       Relevant Orders   US Abdomen Complete   Right flank pain       Relevant Orders   US Abdomen Complete     Epigastric pain-unclear etiology it sounds like the pain actually started there but then he really had more right flank pain if he seen blood in his urine or had a positive urinalysis today I would suspect that  he probably had kidney stones based on his history but he is actually feeling completely better today.  Also consider GERD or GI upset from the iron.  Right flank pain-again see note above consider kidney stones.  He is a little bit tender in the right upper quadrant as well so also consider ultrasound for further work-up to look at the gallbladder liver and kidneys.  Call if any new symptoms develop or recur or if he starts to notice any blood in the urine.   No orders of the defined types were placed in this encounter.    Beatrice Lecher, MD

## 2019-07-14 NOTE — Progress Notes (Signed)
He reports that he has had sxs of R sided low back and flank pain(this was 10/10 yesterday, no pain today, he said that the pain was a deep pain). He denies f/s/c/n/v/d but did say that he had some diarrhea. Did confirm that he had been constipated.He also said that he has had some epigastric pain.     Maryruth Eve, Lahoma Crocker, CMA

## 2019-07-16 ENCOUNTER — Ambulatory Visit (INDEPENDENT_AMBULATORY_CARE_PROVIDER_SITE_OTHER): Payer: Medicare Other

## 2019-07-16 ENCOUNTER — Other Ambulatory Visit: Payer: Self-pay

## 2019-07-16 DIAGNOSIS — R109 Unspecified abdominal pain: Secondary | ICD-10-CM

## 2019-07-16 DIAGNOSIS — R1013 Epigastric pain: Secondary | ICD-10-CM

## 2019-07-16 DIAGNOSIS — R3911 Hesitancy of micturition: Secondary | ICD-10-CM

## 2019-07-16 DIAGNOSIS — R10A1 Flank pain, right side: Secondary | ICD-10-CM

## 2019-07-28 ENCOUNTER — Other Ambulatory Visit: Payer: Self-pay | Admitting: Pulmonary Disease

## 2019-07-28 DIAGNOSIS — R918 Other nonspecific abnormal finding of lung field: Secondary | ICD-10-CM

## 2019-07-29 ENCOUNTER — Other Ambulatory Visit: Payer: Self-pay | Admitting: Family Medicine

## 2019-07-29 DIAGNOSIS — A6000 Herpesviral infection of urogenital system, unspecified: Secondary | ICD-10-CM

## 2019-08-04 ENCOUNTER — Encounter: Payer: Self-pay | Admitting: Family Medicine

## 2019-08-04 ENCOUNTER — Other Ambulatory Visit: Payer: Self-pay

## 2019-08-04 ENCOUNTER — Ambulatory Visit (INDEPENDENT_AMBULATORY_CARE_PROVIDER_SITE_OTHER): Payer: Medicare Other | Admitting: Family Medicine

## 2019-08-04 ENCOUNTER — Ambulatory Visit: Payer: Medicare Other | Admitting: Family Medicine

## 2019-08-04 VITALS — BP 121/67 | HR 53 | Ht 67.0 in | Wt 190.0 lb

## 2019-08-04 DIAGNOSIS — R790 Abnormal level of blood mineral: Secondary | ICD-10-CM | POA: Insufficient documentation

## 2019-08-04 DIAGNOSIS — I10 Essential (primary) hypertension: Secondary | ICD-10-CM | POA: Diagnosis not present

## 2019-08-04 DIAGNOSIS — M545 Low back pain, unspecified: Secondary | ICD-10-CM

## 2019-08-04 DIAGNOSIS — E519 Thiamine deficiency, unspecified: Secondary | ICD-10-CM | POA: Diagnosis not present

## 2019-08-04 DIAGNOSIS — E119 Type 2 diabetes mellitus without complications: Secondary | ICD-10-CM | POA: Diagnosis not present

## 2019-08-04 DIAGNOSIS — Z125 Encounter for screening for malignant neoplasm of prostate: Secondary | ICD-10-CM

## 2019-08-04 DIAGNOSIS — E538 Deficiency of other specified B group vitamins: Secondary | ICD-10-CM | POA: Diagnosis not present

## 2019-08-04 LAB — POCT GLYCOSYLATED HEMOGLOBIN (HGB A1C): Hemoglobin A1C: 4.6 % (ref 4.0–5.6)

## 2019-08-04 NOTE — Assessment & Plan Note (Signed)
Taking liquid iron once a day.  Due to recheck ferritin levels.

## 2019-08-04 NOTE — Assessment & Plan Note (Signed)
A1c looks absolutely phenomenal today and he is pretty much diet and exercise controlled.  He does not take prescription medications.

## 2019-08-04 NOTE — Assessment & Plan Note (Addendum)
Well controlled. Continue current regimen. Follow up in  4-6 mo.  Encouraged him to check blood pressures at home.  If they were lower than 120 then please let me know and will consider backing off on blood pressure medication.

## 2019-08-04 NOTE — Progress Notes (Signed)
Established Patient Office Visit  Subjective:  Patient ID: Brandon [name], male    DOB: 1950-12-24  Age: 68 y.o. MRN: [id number]  CC:  Chief Complaint  Patient presents with  . Diabetes    HPI Brandon [name] presents for   Diabetes - no hypoglycemic events. No wounds or sores that are not healing well. No increased thirst or urination. Checking glucose at home. Taking medications as prescribed without any side effects. Due for foot exam.  Has been working hard to change his diet and is getting more exercise recently.  He is also been taking vitamin B1 and iron.  These were found to be low on recent labs.  Is been tolerating that well and is due for repeat labs.  He also wanted to let me know he had some problems with his right low back.  He said some back pain on and off but says he woke up 1 morning with stiffness and pain to the point he actually went to the [facility] emergency department.  He says that they gave him a muscle relaxer and has been gradually getting a little bit better but is not completely back to baseline.  Past Medical History:  Diagnosis Date  . ED (erectile dysfunction)   . GERD (gastroesophageal reflux disease)   . Hepatitis C    [phone], treated at [facility]  . Hyperlipidemia   . Hypertension   . Nose fracture     Past Surgical History:  Procedure Laterality Date  . FOOT SURGERY Right    1980  . FOOT SURGERY Left    1980  . INGUINAL HERNIA REPAIR Right    2009    Family History  Problem Relation Age of Onset  . Breast cancer Sister   . Depression Sister   . Hyperlipidemia Other   . Ovarian cancer Sister   . Hypertension Other   . Heart disease Brother     Social History   Socioeconomic History  . Marital status: Married    Spouse name: Belinda Bringhurst  . Number of children: 4  . Years of education: 90  . Highest education level: Associate degree: academic program  Occupational History  . Occupation: Investment banker, corporate Rep     Comment: Dept of [employer] in [place]  . Financial resource strain: Not hard at all  . Food insecurity    Worry: Never true    Inability: Never true  . Transportation needs    Medical: No    Non-medical: No  Tobacco Use  . Smoking status: Former Smoker    Packs/day: 1.50    Years: 15.00    Pack years: 22.50    Types: Cigarettes    Quit date: 10/22/1996    Years since quitting: 22.7  . Smokeless tobacco: Never Used  Substance and Sexual Activity  . Alcohol use: No  . Drug use: No  . Sexual activity: Yes    Partners: Female  Lifestyle  . Physical activity    Days per week: 0 days    Minutes per session: 0 min  . Stress: Not at all  Relationships  . Social Herbalist on phone: Once a week    Gets together: Never    Attends religious service: More than 4 times per year    Active member of club or organization: No    Attends meetings of clubs or organizations: Never    Relationship status: Married  . Intimate partner violence  Fear of current or ex partner: No    Emotionally abused: No    Physically abused: No    Forced sexual activity: No  Other Topics Concern  . Not on file  Social History Narrative   No caffeine.  Has a 2 years associate degree. Previously worked at [employer] for 12 years. Also work to [employer].    Outpatient Medications Prior to Visit  Medication Sig Dispense Refill  . albuterol (PROAIR HFA) 108 (90 Base) MCG/ACT inhaler Inhale 2 puffs into the lungs every 6 (six) hours as needed for wheezing or shortness of breath. 18 g 4  . atorvastatin (LIPITOR) 20 MG tablet TAKE 1 TABLET BY MOUTH EVERY DAY 90 tablet 3  . Blood Glucose Monitoring Suppl (ONE TOUCH ULTRA 2) w/Device KIT Check fasting blood sugar 2-3 times weekly. 1 each 0  . budesonide-formoterol (SYMBICORT) 80-4.5 MCG/ACT inhaler Inhale 2 puffs into the lungs 2 (two) times daily. 1 Inhaler 0  . cetirizine (ZYRTEC) 10 MG tablet Take 1 tablet (10 mg total) by mouth daily. 90 tablet  3  . clindamycin (CLEOCIN T) 1 % lotion Apply topically 2 (two) times daily.    . clindamycin (CLEOCIN T) 1 % SWAB     . clotrimazole-betamethasone (LOTRISONE) cream Apply topically 2 (two) times daily. 45 g 3  . cyclobenzaprine (FLEXERIL) 10 MG tablet     . diclofenac sodium (VOLTAREN) 1 % GEL Apply 2 g topically 4 (four) times daily. To affected areas 400 g PRN  . DULoxetine (CYMBALTA) 30 MG capsule Take 30 mg by mouth daily. Take one capsule by mouth for 14 days then take 2 capsules daily for mood.    . fluticasone (FLONASE) 50 MCG/ACT nasal spray Place 2 sprays into both nostrils daily. 16 g 2  . glucose blood (ONETOUCH ULTRA) test strip Check fasting blood sugar 2-3 times weekly. 100 each 12  . hydrochlorothiazide (HYDRODIURIL) 25 MG tablet Take 1 tablet (25 mg total) by mouth daily. 90 tablet 1  . hydrocortisone (ANUSOL-HC) 25 MG suppository Place 25 mg rectally as needed. Reported on 04/19/2016    . hydrocortisone 2.5 % cream Apply topically as needed. Reported on 04/19/2016    . ipratropium (ATROVENT) 0.06 % nasal spray Place 2 sprays into both nostrils every 4 (four) hours as needed for rhinitis. 10 mL 6  . Lancets 30G MISC Check fasting blood sugar 2-3 times weekly. 100 each prn  . losartan (COZAAR) 50 MG tablet Take 1 tablet (50 mg total) by mouth 2 (two) times a day. 360 tablet 1  . montelukast (SINGULAIR) 10 MG tablet TAKE 1 TABLET BY MOUTH AT BEDTIME. 30 tablet 5  . Naftifine HCl 2 % CREA Apply 1 application topically 2 (two) times a day. 60 g 11  . omeprazole (PRILOSEC) 40 MG capsule TAKE 1 CAPSULE BY MOUTH EVERY DAY 90 capsule 3  . sertraline (ZOLOFT) 100 MG tablet Take 1 tablet by mouth daily.    . tadalafil (CIALIS) 5 MG tablet Take 5 mg by mouth daily.  11  . tamsulosin (FLOMAX) 0.4 MG CAPS capsule Take 0.4 mg by mouth.    . terbinafine (LAMISIL) 1 % cream Apply 1 application topically 2 (two) times daily.    [name] topiramate (TOPAMAX) 50 MG tablet TAKE 1 TABLET (50 MG TOTAL) BY  MOUTH 2 (TWO) TIMES DAILY. 180 tablet 1  . valACYclovir (VALTREX) 500 MG tablet TAKE 1 TABLET BY MOUTH EVERY DAY 90 tablet 1   No facility-administered medications prior to  visit.     No Known Allergies  ROS Review of Systems    Objective:    Physical Exam  Constitutional: He is oriented to person, place, and time. He appears well-developed and well-nourished.  HENT:  Head: Normocephalic and atraumatic.  Cardiovascular: Normal rate, regular rhythm and normal heart sounds.  Pulmonary/Chest: Effort normal and breath sounds normal.  Neurological: He is alert and oriented to person, place, and time.  Skin: Skin is warm and dry.  Psychiatric: He has a normal mood and affect. His behavior is normal.    BP 121/67   Pulse (!) 53   Ht _0  (1.702 m)   Wt 190 lb (86.2 kg)   SpO2 100%   BMI 29.76 kg/m  Wt Readings from Last 3 Encounters:  08/04/19 190 lb (86.2 kg)  07/14/19 180 lb (81.6 kg)  05/04/19 189 lb (85.7 kg)     Health Maintenance Due  Topic Date Due  . OPHTHALMOLOGY EXAM  04/21/1960    There are no preventive care reminders to display for this patient.  Lab Results  Component Value Date   TSH 1.76 03/19/2019   Lab Results  Component Value Date   WBC 6.8 04/16/2019   HGB 14.2 04/16/2019   HCT 44.1 04/16/2019   MCV 76.3 (L) 04/16/2019   PLT 160 04/16/2019   Lab Results  Component Value Date   NA 138 04/16/2019   K 3.6 04/16/2019   CO2 29 04/16/2019   GLUCOSE 97 04/16/2019   BUN 14 04/16/2019   CREATININE 1.13 04/16/2019   BILITOT 0.6 04/16/2019   ALKPHOS 93 04/16/2019   AST 18 04/16/2019   ALT 22 04/16/2019   PROT 7.0 04/16/2019   ALBUMIN 4.5 04/16/2019   CALCIUM 10.0 04/16/2019   ANIONGAP 8 04/16/2019   Lab Results  Component Value Date   CHOL 171 01/30/2018   Lab Results  Component Value Date   HDL 31 (L) 01/30/2018   Lab Results  Component Value Date   LDLCALC 113 (H) 01/30/2018   Lab Results  Component Value Date   TRIG 155  (H) 01/30/2018   Lab Results  Component Value Date   CHOLHDL 5.5 (H) 01/30/2018   Lab Results  Component Value Date   HGBA1C 4.6 08/04/2019      Assessment & Plan:   Problem List Items Addressed This Visit      Cardiovascular and Mediastinum   Essential hypertension, benign    Well controlled. Continue current regimen. Follow up in  4-6 mo.  Encouraged him to check blood pressures at home.  If they were lower than 120 then please let me know and will consider backing off on blood pressure medication.      Relevant Orders   COMPLETE METABOLIC PANEL WITH GFR   Lipid panel   PSA     Endocrine   Controlled type 2 diabetes mellitus without complication, without long-term current use of insulin (HCC) - Primary    A1c looks absolutely phenomenal today and he is pretty much diet and exercise controlled.  He does not take prescription medications.      Relevant Orders   POCT glycosylated hemoglobin (Hb A1C) (Completed)   COMPLETE METABOLIC PANEL WITH GFR   Lipid panel   PSA     Other   Low ferritin    Taking liquid iron once a day.  Due to recheck ferritin levels.      Relevant Orders   Ferritin   B12 deficiency  Relevant Orders   Vitamin B1    Other Visit Diagnoses    Screening for prostate cancer       Relevant Orders   PSA   Acute right-sided low back pain without sciatica       Relevant Medications   cyclobenzaprine (FLEXERIL) 10 MG tablet   Thiamin deficiency         Right-sided low back pain-most consistent with musculoskeletal strain he is gradually getting better.  Discussed giving him a handout with some exercises to do on his own at home.  If not improving consider formal PT and additional work-up.  Thiamine deficiency-has been taking B1.  Due to recheck levels.  No orders of the defined types were placed in this encounter.   Follow-up: Return in about 6 months (around 02/02/2020) for Diabetes follow-up.    Beatrice Lecher, MD

## 2019-08-08 ENCOUNTER — Other Ambulatory Visit: Payer: Self-pay | Admitting: Family Medicine

## 2019-08-08 DIAGNOSIS — G43701 Chronic migraine without aura, not intractable, with status migrainosus: Secondary | ICD-10-CM

## 2019-08-08 LAB — PSA: PSA: 0.6 ng/mL (ref <=4.0)

## 2019-08-08 LAB — COMPLETE METABOLIC PANEL WITH GFR
AG Ratio: 1.6 (calc) (ref 1.0–2.5)
ALT: 17 U/L (ref 9–46)
AST: 17 U/L (ref 10–35)
Albumin: 4.6 g/dL (ref 3.6–5.1)
Alkaline phosphatase (APISO): 85 U/L (ref 35–144)
BUN: 17 mg/dL (ref 7–25)
CO2: 28 mmol/L (ref 20–32)
Calcium: 9.9 mg/dL (ref 8.6–10.3)
Chloride: 100 mmol/L (ref 98–110)
Creat: 1.15 mg/dL (ref 0.70–1.25)
GFR, Est African American: 75 mL/min/{1.73_m2} (ref >=60)
GFR, Est Non African American: 65 mL/min/{1.73_m2} (ref >=60)
Globulin: 2.8 g/dL (calc) (ref 1.9–3.7)
Glucose, Bld: 106 mg/dL — ABNORMAL HIGH (ref 65–99)
Potassium: 3.9 mmol/L (ref 3.5–5.3)
Sodium: 138 mmol/L (ref 135–146)
Total Bilirubin: 0.5 mg/dL (ref 0.2–1.2)
Total Protein: 7.4 g/dL (ref 6.1–8.1)

## 2019-08-08 LAB — LIPID PANEL
Cholesterol: 148 mg/dL (ref <200)
HDL: 35 mg/dL — ABNORMAL LOW (ref >=40)
LDL Cholesterol (Calc): 87 mg/dL (calc)
Non-HDL Cholesterol (Calc): 113 mg/dL (calc) (ref <130)
Total CHOL/HDL Ratio: 4.2 (calc) (ref <5.0)
Triglycerides: 154 mg/dL — ABNORMAL HIGH (ref <150)

## 2019-08-08 LAB — VITAMIN B1: Vitamin B1 (Thiamine): 33 nmol/L — ABNORMAL HIGH (ref 8–30)

## 2019-08-08 LAB — FERRITIN: Ferritin: 26 ng/mL (ref 24–380)

## 2019-08-14 ENCOUNTER — Telehealth: Payer: Self-pay | Admitting: Pulmonary Disease

## 2019-08-14 DIAGNOSIS — R918 Other nonspecific abnormal finding of lung field: Secondary | ICD-10-CM

## 2019-08-14 NOTE — Telephone Encounter (Signed)
[facility] CT not doing Medicare CTs anymore If CT location is being changed, a new order has to be placed  Old order cancelled and new order placed 

## 2019-09-09 ENCOUNTER — Ambulatory Visit (HOSPITAL_BASED_OUTPATIENT_CLINIC_OR_DEPARTMENT_OTHER)
Admission: RE | Admit: 2019-09-09 | Discharge: 2019-09-09 | Disposition: A | Discharge status: Home / Self Care | Payer: Medicare Other | Source: Ambulatory Visit | Attending: Pulmonary Disease | Admitting: Pulmonary Disease

## 2019-09-09 ENCOUNTER — Other Ambulatory Visit: Payer: Self-pay

## 2019-09-09 DIAGNOSIS — R918 Other nonspecific abnormal finding of lung field: Secondary | ICD-10-CM | POA: Insufficient documentation

## 2019-09-11 ENCOUNTER — Other Ambulatory Visit: Payer: Medicare Other

## 2019-09-22 DIAGNOSIS — Z0189 Encounter for other specified special examinations: Secondary | ICD-10-CM | POA: Diagnosis not present

## 2019-09-22 DIAGNOSIS — M5416 Radiculopathy, lumbar region: Secondary | ICD-10-CM | POA: Diagnosis not present

## 2019-09-25 ENCOUNTER — Ambulatory Visit (INDEPENDENT_AMBULATORY_CARE_PROVIDER_SITE_OTHER): Payer: Medicare Other | Admitting: Pulmonary Disease

## 2019-09-25 ENCOUNTER — Encounter: Payer: Self-pay | Admitting: Pulmonary Disease

## 2019-09-25 ENCOUNTER — Other Ambulatory Visit: Payer: Self-pay

## 2019-09-25 VITALS — BP 118/70 | HR 60 | Temp 97.6°F | Ht 67.0 in | Wt 193.0 lb

## 2019-09-25 DIAGNOSIS — Z9989 Dependence on other enabling machines and devices: Secondary | ICD-10-CM

## 2019-09-25 DIAGNOSIS — R058 Other specified cough: Secondary | ICD-10-CM

## 2019-09-25 DIAGNOSIS — G4733 Obstructive sleep apnea (adult) (pediatric): Secondary | ICD-10-CM

## 2019-09-25 DIAGNOSIS — R05 Cough: Secondary | ICD-10-CM

## 2019-09-25 DIAGNOSIS — J4520 Mild intermittent asthma, uncomplicated: Secondary | ICD-10-CM

## 2019-09-25 DIAGNOSIS — R911 Solitary pulmonary nodule: Secondary | ICD-10-CM | POA: Diagnosis not present

## 2019-09-25 NOTE — Patient Instructions (Signed)
Will have your home care company refit your CPAP mask and change your CPAP setting to 10 cm water  Follow up in 1 year

## 2019-09-25 NOTE — Progress Notes (Signed)
[facility]  Chief Complaint  Patient presents with  . Follow-up    OSA on CPAP    Constitutional:  BP 118/70 (BP Location: Right Arm, Patient Position: Sitting, Cuff Size: Normal)   Pulse 60   Temp 97.6 F (36.4 C)   Ht _0  (1.702 m)   Wt 193 lb (87.5 kg)   SpO2 98% Comment: on room air  BMI 30.23 kg/m   Past Medical History:  ED, GERD, Hep C, HLD, HTN, BPH, Lumbar spinal stenosis, Delta beta thalassemia  Brief Summary:  Brandon [name] is a 68 y.o. male former smoker with a mild intermittent asthma, upper airway cough, OSA, and lung nodule.  He had CT chest from 09/09/19 (reviewed by me).  No change in lung nodules. Have been stable since 2018.  He has sinus congestion and drainage intermittently.  Not having sore throat, dry mouth, aerophagia, chest pain, wheeze, hemoptysis, or fever.  Using singulair, zyrtec, flonase, atrovent.  Hasn't needed to use symbicort or albuterol recently.  Uses CPAP nightly.  Has lost about 10 lbs since last year.  Changed his diet after he found out he was prediabetic.  He has noticed his CPAP mask leaking more since he lost weight.  He had MRI lumbar spine recently that showed spinal stenosis.  Did rehab several months ago.  Physical Exam:   Appearance - well kempt   ENMT - no sinus tenderness, no nasal discharge, no oral exudate  Neck - no masses, trachea midline, no thyromegaly, no elevation in JVP  Respiratory - normal appearance of chest wall, normal respiratory effort w/o accessory muscle use, no dullness on percussion, no wheezing or rales  CV - s1s2 regular rate and rhythm, no murmurs, no peripheral edema, radial pulses symmetric  GI - soft, non tender  Lymph - no adenopathy noted in neck and axillary areas  MSK - normal gait  Ext - no cyanosis, clubbing, or joint inflammation noted  Skin - no rashes, lesions, or ulcers  Neuro - normal strength, oriented x 3  Psych -  normal mood and affect   Assessment/Plan:   Mild, intermittent asthma. - continue singulair - he can use symbicort as needed if his symptoms progress  Upper airway cough. - continue singulair, atrovent nasal spray, flonase, zyrtec, nasal irrigation  Lung nodule with hx of smoking. - stable on CT imaging from 2018 through 2020 - likely benign lesions - no additional radiographic follow up needed  Obstructive sleep apnea. - he is compliant with CPAP and reports benefit from therapy - he is having mask leak since he lost weight - will have his DME refit his mask and will change his CPAP from 12 to 10 cm H2O   Patient Instructions  Will have your home care company refit your CPAP mask and change your CPAP setting to 10 cm water  Follow up in 1 year    Chesley Mires, MD [place] Pager: [phone] 09/25/2019, 9:40 AM  Flow Sheet     Pulmonary tests:  Spirometry 02/14/18 >> FVC 70%, FEV1 73%, FEV1% 77 RAST 02/20/18 >> negative, IgE 15 PFT 03/10/18 >> FEV1 2.47 (93%), FEV1% 83, FEF 25-75% 1.78 (75%), TLC 5.15 (79%), DLCO 71%, no BD  Chest imaging:  CT chest 01/18/17 >> calcified granulomas, 5 mm nodule LUL, 5 mm nodule LLL, centrilobular nodularity RML CT chest 03/07/18 >> scattered nodules up to 6 mm PFT 03/10/18 >> FEV1 2.47 (93%), FEV1% 83, TLC 5.15 (  79%), DLCO 71%, no BD CT chest 09/10/18 >> 6 mm subpleural nodule LLL, 6 mm nodule RML no change CT chest 09/09/19 >> few nodules up to 6 mm in RML no change  Sleep tests:  ONO with RA 03/06/18 >>test time 7 hrs 25 min. Average SpO2 96%, low SpO2 92%.  CPAP 08/25/19 to 09/23/19 >> used on 30 of 30 nights with average 11 hr 19 min.  Average AHI 34.2 with CPAP 12 cm H2O.  Air leak noted.  Medications:   Allergies as of 09/25/2019   No Known Allergies     Medication List       Accurate as of September 25, 2019  9:40 AM. If you have any questions, ask your nurse or doctor.        albuterol  108 (90 Base) MCG/ACT inhaler Commonly known as: ProAir HFA Inhale 2 puffs into the lungs every 6 (six) hours as needed for wheezing or shortness of breath.   atorvastatin 20 MG tablet Commonly known as: LIPITOR TAKE 1 TABLET BY MOUTH EVERY DAY   budesonide-formoterol 80-4.5 MCG/ACT inhaler Commonly known as: Symbicort Inhale 2 puffs into the lungs 2 (two) times daily.   cetirizine 10 MG tablet Commonly known as: ZYRTEC Take 1 tablet (10 mg total) by mouth daily.   CINNAMON PO Take 2 capsules by mouth daily.   clindamycin 1 % lotion Commonly known as: CLEOCIN T Apply topically 2 (two) times daily.   clindamycin 1 % Swab Commonly known as: CLEOCIN T   clotrimazole-betamethasone cream Commonly known as: LOTRISONE Apply topically 2 (two) times daily.   cyclobenzaprine 10 MG tablet Commonly known as: FLEXERIL   diclofenac sodium 1 % Gel Commonly known as: VOLTAREN Apply 2 g topically 4 (four) times daily. To affected areas   DULoxetine 30 MG capsule Commonly known as: CYMBALTA Take 30 mg by mouth daily. Take one capsule by mouth for 14 days then take 2 capsules daily for mood.   fluticasone 50 MCG/ACT nasal spray Commonly known as: FLONASE Place 2 sprays into both nostrils daily.   glucose blood test strip Commonly known as: OneTouch Ultra Check fasting blood sugar 2-3 times weekly.   hydrochlorothiazide 25 MG tablet Commonly known as: HYDRODIURIL Take 1 tablet (25 mg total) by mouth daily.   hydrocortisone 2.5 % cream Apply topically as needed. Reported on 04/19/2016   hydrocortisone 25 MG suppository Commonly known as: ANUSOL-HC Place 25 mg rectally as needed. Reported on 04/19/2016   ipratropium 0.06 % nasal spray Commonly known as: Atrovent Place 2 sprays into both nostrils every 4 (four) hours as needed for rhinitis.   IRON FORMULA PO Take by mouth daily. In liquid form   Lancets 30G Misc Check fasting blood sugar 2-3 times weekly.   losartan 50 MG  tablet Commonly known as: COZAAR Take 1 tablet (50 mg total) by mouth 2 (two) times a day.   montelukast 10 MG tablet Commonly known as: SINGULAIR TAKE 1 TABLET BY MOUTH AT BEDTIME.   multivitamin tablet Take 1 tablet by mouth daily.   Naftifine HCl 2 % Crea Apply 1 application topically 2 (two) times a day.   omeprazole 40 MG capsule Commonly known as: PRILOSEC TAKE 1 CAPSULE BY MOUTH EVERY DAY   ONE TOUCH ULTRA 2 w/Device Kit Check fasting blood sugar 2-3 times weekly.   sertraline 100 MG tablet Commonly known as: ZOLOFT Take 1 tablet by mouth daily.   tadalafil 5 MG tablet Commonly known as: CIALIS Take 5  mg by mouth daily.   tamsulosin 0.4 MG Caps capsule Commonly known as: FLOMAX Take 0.4 mg by mouth.   terbinafine 1 % cream Commonly known as: LAMISIL Apply 1 application topically 2 (two) times daily.   topiramate 50 MG tablet Commonly known as: TOPAMAX TAKE 1 TABLET BY MOUTH TWICE A DAY   triamcinolone ointment 0.1 % Commonly known as: KENALOG   valACYclovir 500 MG tablet Commonly known as: VALTREX TAKE 1 TABLET BY MOUTH EVERY DAY       Past Surgical History:  He  has a past surgical history that includes Foot surgery (Right); Foot surgery (Left); and Inguinal hernia repair (Right).  Family History:  His family history includes Breast cancer in his sister; Depression in his sister; Heart disease in his brother; Hyperlipidemia in an other family member; Hypertension in an other family member; Ovarian cancer in his sister.  Social History:  He  reports that he quit smoking about 22 years ago. His smoking use included cigarettes. He has a 22.50 pack-year smoking history. He has never used smokeless tobacco. He reports that he does not drink alcohol or use drugs.

## 2019-10-09 ENCOUNTER — Other Ambulatory Visit: Payer: Self-pay | Admitting: Family Medicine

## 2019-10-09 DIAGNOSIS — A6000 Herpesviral infection of urogenital system, unspecified: Secondary | ICD-10-CM

## 2019-11-07 ENCOUNTER — Other Ambulatory Visit: Payer: Self-pay | Admitting: Family Medicine

## 2019-11-25 ENCOUNTER — Other Ambulatory Visit: Payer: Self-pay | Admitting: Family Medicine

## 2019-11-25 DIAGNOSIS — I10 Essential (primary) hypertension: Secondary | ICD-10-CM

## 2019-11-25 NOTE — Telephone Encounter (Signed)
Needs follow up appointment.  

## 2019-11-30 LAB — HM DIABETES EYE EXAM

## 2019-12-11 DIAGNOSIS — Z23 Encounter for immunization: Secondary | ICD-10-CM | POA: Diagnosis not present

## 2019-12-14 LAB — HEPATIC FUNCTION PANEL
ALT: 38 (ref 10–40)
AST: 24 (ref 14–40)
Albumin: 3.9
Bilirubin, Direct: 0.1 (ref 0.01–0.4)
Bilirubin, Total: 0.5

## 2019-12-14 LAB — URINALYSIS, COMPLETE
Bilirubin (Urine): NEGATIVE
Blood, UA: NEGATIVE
Glucose, Ur: NEGATIVE
Ketones, UA: NEGATIVE
Leukocytes,Ua: NEGATIVE
Nitrite, UA: NEGATIVE
Protein,UA: NEGATIVE
Specific Gravity, UA: 1.014
Urobilinogen, UA: <2
pH, Urine: 7

## 2019-12-14 LAB — BASIC METABOLIC PANEL
BUN: 18 (ref 4–21)
CO2: 29 — AB (ref 13–22)
Chloride: 103 (ref 99–108)
Creatinine: 1.2 (ref 0.6–1.3)
Glucose: 83
Potassium: 3.8 (ref 3.4–5.3)
Sodium: 138 (ref 137–147)

## 2019-12-14 LAB — CBC AND DIFFERENTIAL
HCT: 49 (ref 41–53)
Hemoglobin: 16.4 (ref 13.5–17.5)
WBC: 7.5

## 2019-12-14 LAB — LIPID PANEL
Cholesterol: 136 (ref 0–200)
HDL: 32 — AB (ref 35–70)
Triglycerides: 325 — AB (ref 40–160)

## 2019-12-14 LAB — CBC & DIFF AND RETIC
MCH: 26
MCH: 26
MCHC: 33.5
MCHC: 33.5
MCV: 77.6 (ref 76–111)
MCV: 77.6 (ref 76–111)

## 2019-12-14 LAB — CBC: RBC: 6.3 — AB (ref 3.87–5.11)

## 2019-12-14 LAB — COMPREHENSIVE METABOLIC PANEL
Albumin: 3.9 (ref 3.5–5.0)
Calcium: 9.4 (ref 8.7–10.7)

## 2019-12-15 NOTE — Progress Notes (Signed)
Subjective:   Brandon [name] is a 69 y.o. male who presents for Medicare Annual/Subsequent preventive examination.  Review of Systems:  No ROS.  Medicare Wellness Virtual Visit.  Visual/audio telehealth visit, UTA vital signs.   See social history for additional risk factors.    Cardiac Risk Factors include: advanced age (>86mn, >>37women);diabetes mellitus;hypertension;male gender  Sleep patterns: Getting  6 hours of sleep a night . Wakes up 2-3 times a night to void. Wakes up and feels sluggish.    Home Safety/Smoke Alarms: Feels safe in home. Smoke alarms in place.  Living environment; Lives with wife in 1 story home and stairs have hand railson them.  Shower is a walk in shower and no grab bars in place . Seat Belt Safety/BikeLives Helmet: Wears seat belt.    Male:   CCS- UTD    PSA-  UTD Lab Results  Component Value Date   PSA 0.6 08/04/2019   PSA 0.6 08/08/2018        Objective:    Vitals: BP (!) 114/57   Pulse 71   Ht _0  (1.702 m)   Wt 197 lb (89.4 kg)   SpO2 99%   BMI 30.85 kg/m   Body mass index is 30.85 kg/m.  Advanced Directives 12/28/2019 04/16/2019 12/23/2018  Does Patient Have a Medical Advance Directive? No No No  Would patient like information on creating a medical advance directive? No - Patient declined No - Patient declined Yes (MAU/Ambulatory/Procedural Areas - Information given)    Tobacco Social History   Tobacco Use  Smoking Status Former Smoker  . Packs/day: 1.50  . Years: 15.00  . Pack years: 22.50  . Types: Cigarettes  . Quit date: 10/22/1996  . Years since quitting: 23.1  Smokeless Tobacco Never Used     Counseling given: No   Clinical Intake:  Pre-visit preparation completed: Yes  Pain : No/denies pain Pain Score: 0-No pain     Nutritional Risks: None Diabetes: Yes CBG done?: No(last reading at home was 120) Did pt. bring in CBG monitor from home?: No  How often do you need to have someone help you when you  read instructions, pamphlets, or other written materials from your doctor or pharmacy?: 1 - Never What is the last grade level you completed in school?: 16  Interpreter Needed?: No  Information entered by :: KOrlie Dakin LPN  Past Medical History:  Diagnosis Date  . ED (erectile dysfunction)   . GERD (gastroesophageal reflux disease)   . Hepatitis C    [phone] . Hyperlipidemia   . Hypertension   . Nose fracture    Past Surgical History:  Procedure Laterality Date  . FOOT SURGERY Right    1980  . FOOT SURGERY Left    1980  . INGUINAL HERNIA REPAIR Right    2009   Family History  Problem Relation Age of Onset  . Breast cancer Sister   . Depression Sister   . Hyperlipidemia Other   . Ovarian cancer Sister   . Hypertension Other   . Heart disease Brother    Social History   Socioeconomic History  . Marital status: Married    Spouse name: AToshiyuki [name] . Number of children: 4  . Years of education: 110 . Highest education level: Associate degree: academic program  Occupational History  . Occupation: VInvestment banker, corporateRep    [place] [place]  . Smoking  status: Former Smoker    Packs/day: 1.50    Years: 15.00    Pack years: 22.50    Types: Cigarettes    Quit date: 10/22/1996    Years since quitting: 23.1  . Smokeless tobacco: Never Used  Substance and Sexual Activity  . Alcohol use: No  . Drug use: No  . Sexual activity: Yes    Partners: Female  Other Topics Concern  . Not on file  Social History Narrative   No caffeine.  Has a 2 years associate degree. Previously worked at [employer] for 12 years. Also work to [employer].   Social Determinants of Health   Financial Resource Strain:   . Difficulty of Paying Living Expenses: Not on file  Food Insecurity:   . Worried About Charity fundraiser in the Last Year: Not on file  . Ran Out of Food in the Last Year: Not on file  Transportation Needs:   . Lack of  Transportation (Medical): Not on file  . Lack of Transportation (Non-Medical): Not on file  Physical Activity:   . Days of Exercise per Week: Not on file  . Minutes of Exercise per Session: Not on file  Stress:   . Feeling of Stress : Not on file  Social Connections:   . Frequency of Communication with Friends and Family: Not on file  . Frequency of Social Gatherings with Friends and Family: Not on file  . Attends Religious Services: Not on file  . Active Member of Clubs or Organizations: Not on file  . Attends Archivist Meetings: Not on file  . Marital Status: Not on file    Outpatient Encounter Medications as of 12/28/2019  Medication Sig  . albuterol (PROAIR HFA) 108 (90 Base) MCG/ACT inhaler Inhale 2 puffs into the lungs every 6 (six) hours as needed for wheezing or shortness of breath.  [name] atorvastatin (LIPITOR) 20 MG tablet TAKE 1 TABLET BY MOUTH EVERY DAY  . Blood Glucose Monitoring Suppl (ONE TOUCH ULTRA 2) w/Device KIT Check fasting blood sugar 2-3 times weekly.  . budesonide-formoterol (SYMBICORT) 80-4.5 MCG/ACT inhaler Inhale 2 puffs into the lungs 2 (two) times daily.  . cetirizine (ZYRTEC) 10 MG tablet TAKE 1 TABLET BY MOUTH EVERY DAY  . CINNAMON PO Take 2 capsules by mouth daily.  . clindamycin (CLEOCIN T) 1 % lotion Apply topically 2 (two) times daily.  . clindamycin (CLEOCIN T) 1 % SWAB   . clotrimazole-betamethasone (LOTRISONE) cream Apply topically 2 (two) times daily.  . cyclobenzaprine (FLEXERIL) 10 MG tablet   . diclofenac sodium (VOLTAREN) 1 % GEL Apply 2 g topically 4 (four) times daily. To affected areas  . DULoxetine (CYMBALTA) 30 MG capsule Take 30 mg by mouth daily. Take one capsule by mouth for 14 days then take 2 capsules daily for mood.  . fluticasone (FLONASE) 50 MCG/ACT nasal spray Place 2 sprays into both nostrils daily.  [name] glucose blood (ONETOUCH ULTRA) test strip Check fasting blood sugar 2-3 times weekly.  . hydrochlorothiazide (HYDRODIURIL)  25 MG tablet Take 1 tablet (25 mg total) by mouth daily. Needs appointment  . hydrocortisone (ANUSOL-HC) 25 MG suppository Place 25 mg rectally as needed. Reported on 04/19/2016  . hydrocortisone 2.5 % cream Apply topically as needed. Reported on 04/19/2016  . ipratropium (ATROVENT) 0.06 % nasal spray Place 2 sprays into both nostrils every 4 (four) hours as needed for rhinitis.  . Iron-Folic Acid-Vit C48 (IRON FORMULA PO) Take by mouth daily. In liquid form  .  Lancets 30G MISC Check fasting blood sugar 2-3 times weekly.  [name] losartan (COZAAR) 50 MG tablet Take 1 tablet (50 mg total) by mouth 2 (two) times a day.  . montelukast (SINGULAIR) 10 MG tablet TAKE 1 TABLET BY MOUTH AT BEDTIME.  . Multiple Vitamin (MULTIVITAMIN) tablet Take 1 tablet by mouth daily.  . Naftifine HCl 2 % CREA Apply 1 application topically 2 (two) times a day.  . Omega-3 Fatty Acids (FISH OIL) 1000 MG CAPS Take by mouth.  [name] omeprazole (PRILOSEC) 40 MG capsule TAKE 1 CAPSULE BY MOUTH EVERY DAY  . sertraline (ZOLOFT) 100 MG tablet Take 1 tablet by mouth daily.  . tadalafil (CIALIS) 5 MG tablet Take 5 mg by mouth daily.  . tamsulosin (FLOMAX) 0.4 MG CAPS capsule Take 0.4 mg by mouth.  . terbinafine (LAMISIL) 1 % cream Apply 1 application topically 2 (two) times daily.  [name] tiotropium (SPIRIVA) 18 MCG inhalation capsule Place 18 mcg into inhaler and inhale daily.  [name] topiramate (TOPAMAX) 50 MG tablet TAKE 1 TABLET BY MOUTH TWICE A DAY  . triamcinolone ointment (KENALOG) 0.1 %   . valACYclovir (VALTREX) 500 MG tablet TAKE 1 TABLET BY MOUTH EVERY DAY  . TRAZODONE HCL PO Take 100 mg by mouth daily.   No facility-administered encounter medications on file as of 12/28/2019.    Activities of Daily Living In your present state of health, do you have any difficulty performing the following activities: 12/28/2019  Hearing? Y  Comment has noticed some hearing loss in left ear  Vision? N  Difficulty concentrating or making decisions? Y   Comment concentrating at times  Walking or climbing stairs? N  Dressing or bathing? N  Doing errands, shopping? N  Preparing Food and eating ? N  Using the Toilet? N  In the past six months, have you accidently leaked urine? Y  Comment does have some leakage at times  Do you have problems with loss of bowel control? N  Managing your Medications? N  Managing your Finances? N  Housekeeping or managing your Housekeeping? N  Some recent data might be hidden    Patient Care Team: Hali Marry, MD as PCP - General (Family Medicine) Hali Marry, MD as Consulting Physician (Family Medicine)   Assessment:   This is a routine wellness examination for Brandon [name].Physical assessment deferred to PCP.   Exercise Activities and Dietary recommendations Current Exercise Habits: Home exercise routine, Type of exercise: walking, Time (Minutes): 30, Frequency (Times/Week): 2, Weekly Exercise (Minutes/Week): 60, Intensity: Mild Diet Eats a healthy diet of fruits, vegetables and proteins. Breakfast: Oatmeal, toast, eggs Lunch: salad or small hamburger Dinner: Meat ( fish) beans, vegetables salads    Drinks 4 bottles of water daily.  Goals    . weight     Eats at least 3 full meals a day and snacks if needed. Continue to eat your fruits and vegetables. Loose weight at least 12 lbs in the year 2020.    [name] Weight (lb) < 200 lb (90.7 kg)     Wants to get down to 182 lbs.        Fall Risk Fall Risk  12/28/2019 08/04/2019 07/14/2019 12/23/2018 09/09/2018  Falls in the past year? 0 0 0 0 0  Comment - - - - Emmi Telephone Survey: data to providers prior to load  Number falls in past yr: - 0 0 - -  Injury with Fall? - 0 0 - -  Risk for fall due to :  No Fall Risks - - - -  Follow up Falls prevention discussed - - Falls prevention discussed -   Is the patient's home free of loose throw rugs in walkways, pet beds, electrical cords, etc?   yes      Grab bars in the bathroom? no       Handrails on the stairs?   yes      Adequate lighting?   yes   Depression Screen PHQ 2/9 Scores 12/28/2019 07/14/2019 01/02/2019 12/23/2018  PHQ - 2 Score _0 PHQ- 9 Score - 16 15 -    Cognitive Function     6CIT Screen 12/28/2019 12/23/2018  What Year? 0 points 0 points  What month? 0 points 0 points  What time? 0 points 0 points  Count back from 20 0 points 0 points  Months in reverse 0 points 0 points  Repeat phrase 4 points 2 points  Total Score 4 2    Immunization History  Administered Date(s) Administered  . Influenza Split 09/03/2018  . Influenza,inj,Quad PF,6+ Mos 07/14/2014, 08/23/2015  . Influenza-Unspecified 10/01/2011, 08/19/2012, 08/03/2013, 08/15/2016, 08/08/2017, 08/22/2018, 08/06/2019  . Pneumococcal Conjugate-13 10/01/2016  . Pneumococcal Polysaccharide-23 01/15/2012, 08/25/2015  . Tdap 11/18/2014  . Zoster 11/18/2014    Screening Tests Health Maintenance  Topic Date Due  . OPHTHALMOLOGY EXAM  04/21/1961  . HEMOGLOBIN A1C  02/02/2020  . FOOT EXAM  08/03/2020  . COLONOSCOPY  08/04/2020  . TETANUS/TDAP  11/18/2024  . INFLUENZA VACCINE  Completed  . Hepatitis C Screening  Completed  . PNA vac Low Risk Adult  Completed        Plan:    Please schedule your next medicare wellness visit with me in 1 yr.  Mr. Greenman , Thank you for taking time to come for your Medicare Wellness Visit. I appreciate your ongoing commitment to your health goals. Please review the following plan we discussed and let me know if I can assist you in the future.   Continue doing brain stimulating activities (puzzles, reading, adult coloring books, staying active) to keep memory sharp.    These are the goals we discussed: Goals    . weight     Eats at least 3 full meals a day and snacks if needed. Continue to eat your fruits and vegetables. Loose weight at least 12 lbs in the year 2020.    [name] Weight (lb) < 200 lb (90.7 kg)     Wants to get down to 182 lbs.        This  is a list of the screening recommended for you and due dates:  Health Maintenance  Topic Date Due  . Eye exam for diabetics  04/21/1961  . Hemoglobin A1C  02/02/2020  . Complete foot exam   08/03/2020  . Colon Cancer Screening  08/04/2020  . Tetanus Vaccine  11/18/2024  . Flu Shot  Completed  .  Hepatitis C: One time screening is recommended by Center for Disease Control  (CDC) for  adults born from [birth year] through 1966.   Completed  . Pneumonia vaccines  Completed     I have personally reviewed and noted the following in the patient's chart:   . Medical and social history . Use of alcohol, tobacco or illicit drugs  . Current medications and supplements . Functional ability and status . Nutritional status . Physical activity . Advanced directives . List of other physicians . Hospitalizations, surgeries, and ER visits in previous 12 months .  Vitals . Screenings to include cognitive, depression, and falls . Referrals and appointments  In addition, I have reviewed and discussed with patient certain preventive protocols, quality metrics, and best practice recommendations. A written personalized care plan for preventive services as well as general preventive health recommendations were provided to patient.     Joanne Chars, LPN  12/26/1060

## 2019-12-28 ENCOUNTER — Other Ambulatory Visit: Payer: Self-pay

## 2019-12-28 ENCOUNTER — Ambulatory Visit (INDEPENDENT_AMBULATORY_CARE_PROVIDER_SITE_OTHER): Payer: Medicare Other | Admitting: *Deleted

## 2019-12-28 VITALS — BP 114/57 | HR 71 | Ht 67.0 in | Wt 197.0 lb

## 2019-12-28 DIAGNOSIS — Z0000 Encounter for general adult medical examination without abnormal findings: Secondary | ICD-10-CM | POA: Diagnosis not present

## 2019-12-28 NOTE — Patient Instructions (Signed)
Please schedule your next medicare wellness visit with me in 1 yr.  Brandon [name] , Thank you for taking time to come for your Medicare Wellness Visit. I appreciate your ongoing commitment to your health goals. Please review the following plan we discussed and let me know if I can assist you in the future.   Continue doing brain stimulating activities (puzzles, reading, adult coloring books, staying active) to keep memory sharp.   These are the goals we discussed: Goals    . weight     Eats at least 3 full meals a day and snacks if needed. Continue to eat your fruits and vegetables. Loose weight at least 12 lbs in the year 2020.    [name] Weight (lb) < 200 lb (90.7 kg)     Wants to get down to 182 lbs.        Health Maintenance, Male Adopting a healthy lifestyle and getting preventive care are important in promoting health and wellness. Ask your health care provider about:  The right schedule for you to have regular tests and exams.  Things you can do on your own to prevent diseases and keep yourself healthy. What should I know about diet, weight, and exercise? Eat a healthy diet   Eat a diet that includes plenty of vegetables, fruits, low-fat dairy products, and lean protein.  Do not eat a lot of foods that are high in solid fats, added sugars, or sodium. Maintain a healthy weight Body mass index (BMI) is a measurement that can be used to identify possible weight problems. It estimates body fat based on height and weight. Your health care provider can help determine your BMI and help you achieve or maintain a healthy weight. Get regular exercise Get regular exercise. This is one of the most important things you can do for your health. Most adults should:  Exercise for at least 150 minutes each week. The exercise should increase your heart rate and make you sweat (moderate-intensity exercise).  Do strengthening exercises at least twice a week. This is in addition to the  moderate-intensity exercise.  Spend less time sitting. Even light physical activity can be beneficial. Watch cholesterol and blood lipids Have your blood tested for lipids and cholesterol at 70 years of age, then have this test every 5 years. You may need to have your cholesterol levels checked more often if:  Your lipid or cholesterol levels are high.  You are older than 70 years of age.  You are at high risk for heart disease. What should I know about cancer screening? Many types of cancers can be detected early and may often be prevented. Depending on your health history and family history, you may need to have cancer screening at various ages. This may include screening for:  Colorectal cancer.  Prostate cancer.  Skin cancer.  Lung cancer. What should I know about heart disease, diabetes, and high blood pressure? Blood pressure and heart disease  High blood pressure causes heart disease and increases the risk of stroke. This is more likely to develop in people who have high blood pressure readings, are of African descent, or are overweight.  Talk with your health care provider about your target blood pressure readings.  Have your blood pressure checked: ? Every 3-5 years if you are 41-46 years of age. ? Every year if you are 42 years old or older.  If you are between the ages of 62 and 27 and are a current or former smoker, ask  your health care provider if you should have a one-time screening for abdominal aortic aneurysm (AAA). Diabetes Have regular diabetes screenings. This checks your fasting blood sugar level. Have the screening done:  Once every three years after age 81 if you are at a normal weight and have a low risk for diabetes.  More often and at a younger age if you are overweight or have a high risk for diabetes. What should I know about preventing infection? Hepatitis B If you have a higher risk for hepatitis B, you should be screened for this virus. Talk  with your health care provider to find out if you are at risk for hepatitis B infection. Hepatitis C Blood testing is recommended for:  Everyone born from [birth year] through 1966.  Anyone with known risk factors for hepatitis C. Sexually transmitted infections (STIs)  You should be screened each year for STIs, including gonorrhea and chlamydia, if: ? You are sexually active and are younger than 70 years of age. ? You are older than 70 years of age and your health care provider tells you that you are at risk for this type of infection. ? Your sexual activity has changed since you were last screened, and you are at increased risk for chlamydia or gonorrhea. Ask your health care provider if you are at risk.  Ask your health care provider about whether you are at high risk for HIV. Your health care provider may recommend a prescription medicine to help prevent HIV infection. If you choose to take medicine to prevent HIV, you should first get tested for HIV. You should then be tested every 3 months for as long as you are taking the medicine. Follow these instructions at home: Lifestyle  Do not use any products that contain nicotine or tobacco, such as cigarettes, e-cigarettes, and chewing tobacco. If you need help quitting, ask your health care provider.  Do not use street drugs.  Do not share needles.  Ask your health care provider for help if you need support or information about quitting drugs. Alcohol use  Do not drink alcohol if your health care provider tells you not to drink.  If you drink alcohol: ? Limit how much you have to 0-2 drinks a day. ? Be aware of how much alcohol is in your drink. In the [place] one drink equals one 12 oz bottle of beer (355 mL), one 5 oz glass of wine (148 mL), or one 1 oz glass of hard liquor (44 mL). General instructions  Schedule regular health, dental, and eye exams.  Stay current with your vaccines.  Tell your health care provider if: ? You often  feel depressed. ? You have ever been abused or do not feel safe at home. Summary  Adopting a healthy lifestyle and getting preventive care are important in promoting health and wellness.  Follow your health care provider's instructions about healthy diet, exercising, and getting tested or screened for diseases.  Follow your health care provider's instructions on monitoring your cholesterol and blood pressure. This information is not intended to replace advice given to you by your health care provider. Make sure you discuss any questions you have with your health care provider. Document Revised: 10/01/2018 Document Reviewed: 10/01/2018 Elsevier Patient Education  2020 [employer].

## 2020-01-01 DIAGNOSIS — Z23 Encounter for immunization: Secondary | ICD-10-CM | POA: Diagnosis not present

## 2020-02-02 ENCOUNTER — Encounter: Payer: Self-pay | Admitting: Family Medicine

## 2020-02-02 ENCOUNTER — Other Ambulatory Visit: Payer: Self-pay

## 2020-02-02 ENCOUNTER — Ambulatory Visit (INDEPENDENT_AMBULATORY_CARE_PROVIDER_SITE_OTHER): Payer: Medicare Other | Admitting: Family Medicine

## 2020-02-02 VITALS — BP 137/65 | HR 54 | Ht 67.0 in | Wt 192.0 lb

## 2020-02-02 DIAGNOSIS — I10 Essential (primary) hypertension: Secondary | ICD-10-CM | POA: Diagnosis not present

## 2020-02-02 DIAGNOSIS — F339 Major depressive disorder, recurrent, unspecified: Secondary | ICD-10-CM

## 2020-02-02 DIAGNOSIS — E119 Type 2 diabetes mellitus without complications: Secondary | ICD-10-CM

## 2020-02-02 DIAGNOSIS — R790 Abnormal level of blood mineral: Secondary | ICD-10-CM

## 2020-02-02 LAB — POCT GLYCOSYLATED HEMOGLOBIN (HGB A1C): Hemoglobin A1C: 4.8 % (ref 4.0–5.6)

## 2020-02-02 MED ORDER — HYDROCHLOROTHIAZIDE 25 MG PO TABS: 25.0000 mg | ORAL_TABLET | Freq: Every day | ORAL | 1 refills | Status: DC

## 2020-02-02 MED ORDER — ONETOUCH ULTRA 2 W/DEVICE KIT: PACK | 0 refills | Status: DC

## 2020-02-02 MED ORDER — ONETOUCH ULTRA VI STRP: ORAL_STRIP | 12 refills | Status: DC

## 2020-02-02 MED ORDER — LANCETS 30G MISC: 99 refills | Status: DC

## 2020-02-02 NOTE — Assessment & Plan Note (Signed)
Really well controlled without medication in fact his A1c is down to 4.8.  Okay to decrease A1c testing to every 12 months.  He did have his eye exam done at the [place] and will drop a copy of that off for [place] to half.

## 2020-02-02 NOTE — Assessment & Plan Note (Signed)
Pressure is up just a little bit today.  Normally looks absolutely fantastic.  We will continue to monitor.  Plan to follow-up in 6 months.

## 2020-02-02 NOTE — Progress Notes (Signed)
Established Patient Office Visit  Subjective:  Patient ID: Brandon [name], male    DOB: 1951-09-07  Age: 69 y.o. MRN: [id number]  CC:  Chief Complaint  Patient presents with  . Diabetes  . Hypertension    HPI Brandon [name] presents for   Hypertension- Pt denies chest pain, SOB, dizziness, or heart palpitations.  Taking meds as directed w/o problems.  Denies medication side effects.    Diabetes - no hypoglycemic events. No wounds or sores that are not healing well. No increased thirst or urination. Checking glucose at home. Taking medications as prescribed without any side effects.   Past Medical History:  Diagnosis Date  . ED (erectile dysfunction)   . GERD (gastroesophageal reflux disease)   . Hepatitis C    [phone], treated at [facility]  . Hyperlipidemia   . Hypertension   . Nose fracture     Past Surgical History:  Procedure Laterality Date  . FOOT SURGERY Right    1980  . FOOT SURGERY Left    1980  . INGUINAL HERNIA REPAIR Right    2009    Family History  Problem Relation Age of Onset  . Breast cancer Sister   . Depression Sister   . Hyperlipidemia Other   . Ovarian cancer Sister   . Hypertension Other   . Heart disease Brother     Social History   Socioeconomic History  . Marital status: Married    Spouse name: Tamel Abel  . Number of children: 4  . Years of education: 76  . Highest education level: Associate degree: academic program  Occupational History  . Occupation: Investment banker, corporate Rep    Comment: Dept of [employer] in [place] Use  . Smoking status: Former Smoker    Packs/day: 1.50    Years: 15.00    Pack years: 22.50    Types: Cigarettes    Quit date: 10/22/1996    Years since quitting: 23.2  . Smokeless tobacco: Never Used  Substance and Sexual Activity  . Alcohol use: No  . Drug use: No  . Sexual activity: Yes    Partners: Female  Other Topics Concern  . Not on file  Social History Narrative   No  caffeine.  Has a 2 years associate degree. Previously worked at [employer] for 12 years. Also work to [employer].   Social Determinants of Health   Financial Resource Strain:   . Difficulty of Paying Living Expenses:   Food Insecurity:   . Worried About Charity fundraiser in the Last Year:   . Arboriculturist in the Last Year:   Transportation Needs:   . Film/video editor (Medical):   [name] Lack of Transportation (Non-Medical):   Physical Activity:   . Days of Exercise per Week:   . Minutes of Exercise per Session:   Stress:   . Feeling of Stress :   Social Connections:   . Frequency of Communication with Friends and Family:   . Frequency of Social Gatherings with Friends and Family:   . Attends Religious Services:   . Active Member of Clubs or Organizations:   . Attends Archivist Meetings:   [name] Marital Status:   Intimate Partner Violence:   . Fear of Current or Ex-Partner:   . Emotionally Abused:   [name] Physically Abused:   . Sexually Abused:     Outpatient Medications Prior to Visit  Medication Sig Dispense Refill  . albuterol (PROAIR HFA)  108 (90 Base) MCG/ACT inhaler Inhale 2 puffs into the lungs every 6 (six) hours as needed for wheezing or shortness of breath. 18 g 4  . atorvastatin (LIPITOR) 20 MG tablet TAKE 1 TABLET BY MOUTH EVERY DAY 90 tablet 3  . budesonide-formoterol (SYMBICORT) 80-4.5 MCG/ACT inhaler Inhale 2 puffs into the lungs 2 (two) times daily. 1 Inhaler 0  . cetirizine (ZYRTEC) 10 MG tablet TAKE 1 TABLET BY MOUTH EVERY DAY 90 tablet 3  . CINNAMON PO Take 2 capsules by mouth daily.    . clindamycin (CLEOCIN T) 1 % lotion Apply topically 2 (two) times daily.    . clindamycin (CLEOCIN T) 1 % SWAB     . clotrimazole-betamethasone (LOTRISONE) cream Apply topically 2 (two) times daily. 45 g 3  . cyclobenzaprine (FLEXERIL) 10 MG tablet     . diclofenac sodium (VOLTAREN) 1 % GEL Apply 2 g topically 4 (four) times daily. To affected areas 400 g PRN  . DULoxetine  (CYMBALTA) 30 MG capsule Take 30 mg by mouth daily. Take one capsule by mouth for 14 days then take 2 capsules daily for mood.    . fluticasone (FLONASE) 50 MCG/ACT nasal spray Place 2 sprays into both nostrils daily. 16 g 2  . hydrocortisone (ANUSOL-HC) 25 MG suppository Place 25 mg rectally as needed. Reported on 04/19/2016    . hydrocortisone 2.5 % cream Apply topically as needed. Reported on 04/19/2016    . ipratropium (ATROVENT) 0.06 % nasal spray Place 2 sprays into both nostrils every 4 (four) hours as needed for rhinitis. 10 mL 6  . Iron-Folic Acid-Vit J68 (IRON FORMULA PO) Take by mouth daily. In liquid form    . losartan (COZAAR) 50 MG tablet Take 1 tablet (50 mg total) by mouth 2 (two) times a day. 360 tablet 1  . montelukast (SINGULAIR) 10 MG tablet TAKE 1 TABLET BY MOUTH AT BEDTIME. 30 tablet 5  . Multiple Vitamin (MULTIVITAMIN) tablet Take 1 tablet by mouth daily.    . Naftifine HCl 2 % CREA Apply 1 application topically 2 (two) times a day. 60 g 11  . Omega-3 Fatty Acids (FISH OIL) 1000 MG CAPS Take by mouth.    [name] omeprazole (PRILOSEC) 40 MG capsule TAKE 1 CAPSULE BY MOUTH EVERY DAY 90 capsule 3  . sertraline (ZOLOFT) 100 MG tablet Take 1 tablet by mouth daily.    . tadalafil (CIALIS) 5 MG tablet Take 5 mg by mouth daily.  11  . tamsulosin (FLOMAX) 0.4 MG CAPS capsule Take 0.4 mg by mouth.    . terbinafine (LAMISIL) 1 % cream Apply 1 application topically 2 (two) times daily.    [name] tiotropium (SPIRIVA) 18 MCG inhalation capsule Place 18 mcg into inhaler and inhale daily.    [name] topiramate (TOPAMAX) 50 MG tablet TAKE 1 TABLET BY MOUTH TWICE A DAY 180 tablet 1  . TRAZODONE HCL PO Take 100 mg by mouth daily.    [name] triamcinolone ointment (KENALOG) 0.1 %     . valACYclovir (VALTREX) 500 MG tablet TAKE 1 TABLET BY MOUTH EVERY DAY 90 tablet 1  . Blood Glucose Monitoring Suppl (ONE TOUCH ULTRA 2) w/Device KIT Check fasting blood sugar 2-3 times weekly. 1 each 0  . glucose blood (ONETOUCH  ULTRA) test strip Check fasting blood sugar 2-3 times weekly. 100 each 12  . hydrochlorothiazide (HYDRODIURIL) 25 MG tablet Take 1 tablet (25 mg total) by mouth daily. Needs appointment 90 tablet 0  . Lancets 30G MISC Check  fasting blood sugar 2-3 times weekly. 100 each prn   No facility-administered medications prior to visit.    No Known Allergies  ROS Review of Systems    Objective:    Physical Exam  Constitutional: He is oriented to person, place, and time. He appears well-developed and well-nourished.  HENT:  Head: Normocephalic and atraumatic.  Cardiovascular: Normal rate, regular rhythm and normal heart sounds.  Pulmonary/Chest: Effort normal and breath sounds normal.  Neurological: He is alert and oriented to person, place, and time.  Skin: Skin is warm and dry.  Psychiatric: He has a normal mood and affect. His behavior is normal.    BP 137/65   Pulse (!) 54   Ht 5' 7"  (1.702 m)   Wt 192 lb (87.1 kg)   SpO2 99%   BMI 30.07 kg/m  Wt Readings from Last 3 Encounters:  02/02/20 192 lb (87.1 kg)  12/28/19 197 lb (89.4 kg)  09/25/19 193 lb (87.5 kg)     Health Maintenance Due  Topic Date Due  . OPHTHALMOLOGY EXAM  Never done    There are no preventive care reminders to display for this patient.  Lab Results  Component Value Date   TSH 1.76 03/19/2019   Lab Results  Component Value Date   WBC 6.8 04/16/2019   HGB 14.2 04/16/2019   HCT 44.1 04/16/2019   MCV 76.3 (L) 04/16/2019   PLT 160 04/16/2019   Lab Results  Component Value Date   NA 138 08/04/2019   K 3.9 08/04/2019   CO2 28 08/04/2019   GLUCOSE 106 (H) 08/04/2019   BUN 17 08/04/2019   CREATININE 1.15 08/04/2019   BILITOT 0.5 08/04/2019   ALKPHOS 93 04/16/2019   AST 17 08/04/2019   ALT 17 08/04/2019   PROT 7.4 08/04/2019   ALBUMIN 4.5 04/16/2019   CALCIUM 9.9 08/04/2019   ANIONGAP 8 04/16/2019   Lab Results  Component Value Date   CHOL 148 08/04/2019   Lab Results  Component Value  Date   HDL 35 (L) 08/04/2019   Lab Results  Component Value Date   LDLCALC 87 08/04/2019   Lab Results  Component Value Date   TRIG 154 (H) 08/04/2019   Lab Results  Component Value Date   CHOLHDL 4.2 08/04/2019   Lab Results  Component Value Date   HGBA1C 4.8 02/02/2020      Assessment & Plan:   Problem List Items Addressed This Visit      Cardiovascular and Mediastinum   Essential hypertension, benign    Pressure is up just a little bit today.  Normally looks absolutely fantastic.  We will continue to monitor.  Plan to follow-up in 6 months.      Relevant Medications   hydrochlorothiazide (HYDRODIURIL) 25 MG tablet   Other Relevant Orders   BASIC METABOLIC PANEL WITH GFR     Endocrine   Controlled type 2 diabetes mellitus without complication, without long-term current use of insulin ([place]) - Primary    Really well controlled without medication in fact his A1c is down to 4.8.  Okay to decrease A1c testing to every 12 months.  He did have his eye exam done at the [place] and will drop a copy of that off for [place] to half.      Relevant Orders   POCT glycosylated hemoglobin (Hb A1C) (Completed)     Other   Low ferritin    Still taking a daily iron supplement.  Due to recheck his ferritin  he finally got his levels into the normal range back in October but I really want a get his ferritin greater than 40.      Relevant Orders   Ferritin   Depression, recurrent ([place])    He is followed and managed at the [place].  He says another medication was recently added back which she had taken before he is taking it 3 times a day but does not remember the name of it.  He feels like his anxiety is just been ramped up a little bit more than usual.  Sleep has been fair and he does take trazodone for that.         Meds ordered this encounter  Medications  . Blood Glucose Monitoring Suppl (ONE TOUCH ULTRA 2) w/Device KIT    Sig: Check fasting blood sugar 2-3 times weekly.    Dispense:  1  kit    Refill:  0  . glucose blood (ONETOUCH ULTRA) test strip    Sig: Check fasting blood sugar 2-3 times weekly.    Dispense:  100 each    Refill:  12  . Lancets 30G MISC    Sig: Check fasting blood sugar 2-3 times weekly.    Dispense:  100 each    Refill:  prn  . hydrochlorothiazide (HYDRODIURIL) 25 MG tablet    Sig: Take 1 tablet (25 mg total) by mouth daily.    Dispense:  90 tablet    Refill:  1    Ok to fill 02/15/2020    Follow-up: Return in about 6 months (around 08/03/2020) for Hypertension.    Beatrice Lecher, MD

## 2020-02-02 NOTE — Assessment & Plan Note (Signed)
He is followed and managed at the [place].  He says another medication was recently added back which she had taken before he is taking it 3 times a day but does not remember the name of it.  He feels like his anxiety is just been ramped up a little bit more than usual.  Sleep has been fair and he does take trazodone for that.

## 2020-02-02 NOTE — Assessment & Plan Note (Signed)
Still taking a daily iron supplement.  Due to recheck his ferritin he finally got his levels into the normal range back in October but I really want a get his ferritin greater than 40.

## 2020-02-04 ENCOUNTER — Other Ambulatory Visit: Payer: Self-pay | Admitting: Family Medicine

## 2020-02-04 ENCOUNTER — Encounter: Payer: Self-pay | Admitting: Family Medicine

## 2020-02-04 ENCOUNTER — Other Ambulatory Visit: Payer: Self-pay

## 2020-02-04 ENCOUNTER — Ambulatory Visit (INDEPENDENT_AMBULATORY_CARE_PROVIDER_SITE_OTHER): Payer: Medicare Other | Admitting: Family Medicine

## 2020-02-04 VITALS — BP 125/62 | HR 58 | Ht 67.0 in | Wt 197.0 lb

## 2020-02-04 DIAGNOSIS — E785 Hyperlipidemia, unspecified: Secondary | ICD-10-CM

## 2020-02-04 DIAGNOSIS — R3989 Other symptoms and signs involving the genitourinary system: Secondary | ICD-10-CM

## 2020-02-04 DIAGNOSIS — N401 Enlarged prostate with lower urinary tract symptoms: Secondary | ICD-10-CM

## 2020-02-04 DIAGNOSIS — R3911 Hesitancy of micturition: Secondary | ICD-10-CM

## 2020-02-04 DIAGNOSIS — G43701 Chronic migraine without aura, not intractable, with status migrainosus: Secondary | ICD-10-CM

## 2020-02-04 DIAGNOSIS — R790 Abnormal level of blood mineral: Secondary | ICD-10-CM | POA: Diagnosis not present

## 2020-02-04 DIAGNOSIS — I10 Essential (primary) hypertension: Secondary | ICD-10-CM | POA: Diagnosis not present

## 2020-02-04 LAB — BASIC METABOLIC PANEL WITH GFR
BUN: 13 mg/dL (ref 7–25)
CO2: 30 mmol/L (ref 20–32)
Calcium: 10.1 mg/dL (ref 8.6–10.3)
Chloride: 100 mmol/L (ref 98–110)
Creat: 1.15 mg/dL (ref 0.70–1.25)
GFR, Est African American: 75 mL/min/{1.73_m2} (ref >=60)
GFR, Est Non African American: 65 mL/min/{1.73_m2} (ref >=60)
Glucose, Bld: 100 mg/dL — ABNORMAL HIGH (ref 65–99)
Potassium: 4 mmol/L (ref 3.5–5.3)
Sodium: 139 mmol/L (ref 135–146)

## 2020-02-04 LAB — POCT URINALYSIS DIP (CLINITEK)
Bilirubin, UA: NEGATIVE
Blood, UA: NEGATIVE
Glucose, UA: NEGATIVE mg/dL
Ketones, POC UA: NEGATIVE mg/dL
Leukocytes, UA: NEGATIVE
Nitrite, UA: NEGATIVE
POC PROTEIN,UA: NEGATIVE
Spec Grav, UA: 1.015 (ref 1.010–1.025)
Urobilinogen, UA: 0.2 E.U./dL
pH, UA: 7 (ref 5.0–8.0)

## 2020-02-04 LAB — FERRITIN: Ferritin: 37 ng/mL (ref 24–380)

## 2020-02-04 MED ORDER — ROSUVASTATIN CALCIUM 10 MG PO TABS: 10.0000 mg | ORAL_TABLET | Freq: Every day | ORAL | 3 refills | Status: DC

## 2020-02-04 NOTE — Assessment & Plan Note (Signed)
I do think his urinary hesitancy is related to his BPH.  Urinalysis is normal today.  We will check renal function.  Normal abdominal exam.  He actually has an upcoming appoint with his urologist so encouraged him to speak to him about it he is also getting some nocturia and feeling like he is not emptying completely.  He is currently only on Flomax it might be at the point where he needs additional therapy with a 5 alpha reductase inhibitor.

## 2020-02-04 NOTE — Progress Notes (Signed)
Acute Office Visit  Subjective:    Patient ID: Brandon [name], male    DOB: 04-12-1951, 69 y.o.   MRN: [id number]  Chief Complaint  Patient presents with  . Follow-up    HPI Patient is in today for urinary hesitancy.  He says he is having a little bit of difficulty starting urination and he just feels like he is not emptying completely.  He has a known history of benign prostatic hypertrophy and is on Flomax.  He actually does follow with Dr. Domingo Cocking, his urologist in fact he thinks his last appointment was about 5 months ago and has an upcoming appointment soon.  He does report nocturia 2-3 times per night on average but admits he does drink a lot of fluid before bedtime.  He says he is particularly worried about his kidneys.  He also wanted to discuss his statin.  He feels like he has been getting some achiness particularly in the upper body but really a little bit all over.  He feels like it is definitely related to the atorvastatin he has never been on any other statin.  Indication for statin is his diabetes.  He also reports some occasional upper abdominal pain.  Sometimes it happens with eating sometimes not with eating.  He does have reflux but says he takes his PPI daily.  He says the pain is not associated with change in bowels or loose stools.  He denies any pelvic pain.  Past Medical History:  Diagnosis Date  . ED (erectile dysfunction)   . GERD (gastroesophageal reflux disease)   . Hepatitis C    [phone], treated at [facility]  . Hyperlipidemia   . Hypertension   . Nose fracture     Past Surgical History:  Procedure Laterality Date  . FOOT SURGERY Right    1980  . FOOT SURGERY Left    1980  . INGUINAL HERNIA REPAIR Right    2009    Family History  Problem Relation Age of Onset  . Breast cancer Sister   . Depression Sister   . Hyperlipidemia Other   . Ovarian cancer Sister   . Hypertension Other   . Heart disease Brother     Social History    Socioeconomic History  . Marital status: Married    Spouse name: Brandon [name]  . Number of children: 4  . Years of education: 80  . Highest education level: Associate degree: academic program  Occupational History  . Occupation: Investment banker, corporate Rep    Comment: Dept of [employer] in [place] Use  . Smoking status: Former Smoker    Packs/day: 1.50    Years: 15.00    Pack years: 22.50    Types: Cigarettes    Quit date: 10/22/1996    Years since quitting: 23.3  . Smokeless tobacco: Never Used  Substance and Sexual Activity  . Alcohol use: No  . Drug use: No  . Sexual activity: Yes    Partners: Female  Other Topics Concern  . Not on file  Social History Narrative   No caffeine.  Has a 2 years associate degree. Previously worked at [employer] for 12 years. Also work to [employer].   Social Determinants of Health   Financial Resource Strain:   . Difficulty of Paying Living Expenses:   Food Insecurity:   . Worried About Charity fundraiser in the Last Year:   . Arboriculturist in the Last Year:   Transportation Needs:   .  Lack of Transportation (Medical):   [name] Lack of Transportation (Non-Medical):   Physical Activity:   . Days of Exercise per Week:   . Minutes of Exercise per Session:   Stress:   . Feeling of Stress :   Social Connections:   . Frequency of Communication with Friends and Family:   . Frequency of Social Gatherings with Friends and Family:   . Attends Religious Services:   . Active Member of Clubs or Organizations:   . Attends Archivist Meetings:   [name] Marital Status:   Intimate Partner Violence:   . Fear of Current or Ex-Partner:   . Emotionally Abused:   [name] Physically Abused:   . Sexually Abused:     Outpatient Medications Prior to Visit  Medication Sig Dispense Refill  . albuterol (PROAIR HFA) 108 (90 Base) MCG/ACT inhaler Inhale 2 puffs into the lungs every 6 (six) hours as needed for wheezing or shortness of breath. 18 g 4  . Blood  Glucose Monitoring Suppl (ONE TOUCH ULTRA 2) w/Device KIT Check fasting blood sugar 2-3 times weekly. 1 kit 0  . budesonide-formoterol (SYMBICORT) 80-4.5 MCG/ACT inhaler Inhale 2 puffs into the lungs 2 (two) times daily. 1 Inhaler 0  . cetirizine (ZYRTEC) 10 MG tablet TAKE 1 TABLET BY MOUTH EVERY DAY 90 tablet 3  . CINNAMON PO Take 2 capsules by mouth daily.    . clindamycin (CLEOCIN T) 1 % lotion Apply topically 2 (two) times daily.    . clindamycin (CLEOCIN T) 1 % SWAB     . clotrimazole-betamethasone (LOTRISONE) cream Apply topically 2 (two) times daily. 45 g 3  . cyclobenzaprine (FLEXERIL) 10 MG tablet     . diclofenac sodium (VOLTAREN) 1 % GEL Apply 2 g topically 4 (four) times daily. To affected areas 400 g PRN  . DULoxetine (CYMBALTA) 30 MG capsule Take 30 mg by mouth daily. Take one capsule by mouth for 14 days then take 2 capsules daily for mood.    . fluticasone (FLONASE) 50 MCG/ACT nasal spray Place 2 sprays into both nostrils daily. 16 g 2  . glucose blood (ONETOUCH ULTRA) test strip Check fasting blood sugar 2-3 times weekly. 100 each 12  . hydrochlorothiazide (HYDRODIURIL) 25 MG tablet Take 1 tablet (25 mg total) by mouth daily. 90 tablet 1  . hydrocortisone (ANUSOL-HC) 25 MG suppository Place 25 mg rectally as needed. Reported on 04/19/2016    . hydrocortisone 2.5 % cream Apply topically as needed. Reported on 04/19/2016    . ipratropium (ATROVENT) 0.06 % nasal spray Place 2 sprays into both nostrils every 4 (four) hours as needed for rhinitis. 10 mL 6  . Iron-Folic Acid-Vit I95 (IRON FORMULA PO) Take by mouth daily. In liquid form    . Lancets 30G MISC Check fasting blood sugar 2-3 times weekly. 100 each prn  . losartan (COZAAR) 50 MG tablet Take 1 tablet (50 mg total) by mouth 2 (two) times a day. 360 tablet 1  . montelukast (SINGULAIR) 10 MG tablet TAKE 1 TABLET BY MOUTH AT BEDTIME. 30 tablet 5  . Multiple Vitamin (MULTIVITAMIN) tablet Take 1 tablet by mouth daily.    . Naftifine  HCl 2 % CREA Apply 1 application topically 2 (two) times a day. 60 g 11  . Omega-3 Fatty Acids (FISH OIL) 1000 MG CAPS Take by mouth.    [name] omeprazole (PRILOSEC) 40 MG capsule TAKE 1 CAPSULE BY MOUTH EVERY DAY 90 capsule 3  . sertraline (ZOLOFT) 100 MG tablet  Take 1 tablet by mouth daily.    . tadalafil (CIALIS) 5 MG tablet Take 5 mg by mouth daily.  11  . tamsulosin (FLOMAX) 0.4 MG CAPS capsule Take 0.4 mg by mouth.    . terbinafine (LAMISIL) 1 % cream Apply 1 application topically 2 (two) times daily.    [name] tiotropium (SPIRIVA) 18 MCG inhalation capsule Place 18 mcg into inhaler and inhale daily.    [name] topiramate (TOPAMAX) 50 MG tablet TAKE 1 TABLET BY MOUTH TWICE A DAY 180 tablet 1  . TRAZODONE HCL PO Take 100 mg by mouth daily.    [name] triamcinolone ointment (KENALOG) 0.1 %     . valACYclovir (VALTREX) 500 MG tablet TAKE 1 TABLET BY MOUTH EVERY DAY 90 tablet 1  . atorvastatin (LIPITOR) 20 MG tablet TAKE 1 TABLET BY MOUTH EVERY DAY 90 tablet 3   No facility-administered medications prior to visit.    Allergies  Allergen Reactions  . Atorvastatin Other (See Comments)    Myalgias    Review of Systems     Objective:    Physical Exam Constitutional:      Appearance: He is well-developed.  HENT:     Head: Normocephalic and atraumatic.  Cardiovascular:     Rate and Rhythm: Normal rate and regular rhythm.     Heart sounds: Normal heart sounds.     Comments: No renal artery bruits. Pulmonary:     Effort: Pulmonary effort is normal.     Breath sounds: Normal breath sounds.  Abdominal:     General: Abdomen is flat. Bowel sounds are normal. There is no distension.     Palpations: Abdomen is soft.     Tenderness: There is no abdominal tenderness.  Musculoskeletal:     Comments: No CVA tenderness.  Skin:    General: Skin is warm and dry.  Neurological:     Mental Status: He is alert and oriented to person, place, and time.  Psychiatric:        Behavior: Behavior normal.     BP  125/62   Pulse (!) 58   Ht 5' 7"  (1.702 m)   Wt 197 lb (89.4 kg)   SpO2 100%   BMI 30.85 kg/m  Wt Readings from Last 3 Encounters:  02/04/20 197 lb (89.4 kg)  02/02/20 192 lb (87.1 kg)  12/28/19 197 lb (89.4 kg)    Health Maintenance Due  Topic Date Due  . OPHTHALMOLOGY EXAM  Never done    There are no preventive care reminders to display for this patient.   Lab Results  Component Value Date   TSH 1.76 03/19/2019   Lab Results  Component Value Date   WBC 6.8 04/16/2019   HGB 14.2 04/16/2019   HCT 44.1 04/16/2019   MCV 76.3 (L) 04/16/2019   PLT 160 04/16/2019   Lab Results  Component Value Date   NA 138 08/04/2019   K 3.9 08/04/2019   CO2 28 08/04/2019   GLUCOSE 106 (H) 08/04/2019   BUN 17 08/04/2019   CREATININE 1.15 08/04/2019   BILITOT 0.5 08/04/2019   ALKPHOS 93 04/16/2019   AST 17 08/04/2019   ALT 17 08/04/2019   PROT 7.4 08/04/2019   ALBUMIN 4.5 04/16/2019   CALCIUM 9.9 08/04/2019   ANIONGAP 8 04/16/2019   Lab Results  Component Value Date   CHOL 148 08/04/2019   Lab Results  Component Value Date   HDL 35 (L) 08/04/2019   Lab Results  Component Value Date  [place] 87 08/04/2019   Lab Results  Component Value Date   TRIG 154 (H) 08/04/2019   Lab Results  Component Value Date   CHOLHDL 4.2 08/04/2019   Lab Results  Component Value Date   HGBA1C 4.8 02/02/2020       Assessment & Plan:   Problem List Items Addressed This Visit      Genitourinary   BPH (benign prostatic hyperplasia)    I do think his urinary hesitancy is related to his BPH.  Urinalysis is normal today.  We will check renal function.  Normal abdominal exam.  He actually has an upcoming appoint with his urologist so encouraged him to speak to him about it he is also getting some nocturia and feeling like he is not emptying completely.  He is currently only on Flomax it might be at the point where he needs additional therapy with a 5 alpha reductase inhibitor.         Other   Hyperlipidemia    Gust options including changing statins completely or decreasing his dose on his current statin.  He has never tried anything but atorvastatin so we will have him hold the medication for 10 days until the body aches resolved and then have him on Crestor.  He can let me know how he does on that medication.      Relevant Medications   rosuvastatin (CRESTOR) 10 MG tablet    Other Visit Diagnoses    Urinary problem    -  Primary   Relevant Orders   POCT URINALYSIS DIP (CLINITEK) (Completed)      Meds ordered this encounter  Medications  . rosuvastatin (CRESTOR) 10 MG tablet    Sig: Take 1 tablet (10 mg total) by mouth at bedtime.    Dispense:  30 tablet    Refill:  3     Beatrice Lecher, MD

## 2020-02-04 NOTE — Assessment & Plan Note (Addendum)
Gust options including changing statins completely or decreasing his dose on his current statin.  He has never tried anything but atorvastatin so we will have him hold the medication for 10 days until the body aches resolved and then have him on Crestor.  He can let me know how he does on that medication.

## 2020-02-10 ENCOUNTER — Encounter: Payer: Self-pay | Admitting: Family Medicine

## 2020-02-26 ENCOUNTER — Other Ambulatory Visit: Payer: Self-pay | Admitting: Family Medicine

## 2020-02-26 DIAGNOSIS — A6000 Herpesviral infection of urogenital system, unspecified: Secondary | ICD-10-CM

## 2020-03-03 ENCOUNTER — Other Ambulatory Visit: Payer: Self-pay

## 2020-03-03 ENCOUNTER — Other Ambulatory Visit: Payer: Self-pay | Admitting: Family Medicine

## 2020-03-03 DIAGNOSIS — I10 Essential (primary) hypertension: Secondary | ICD-10-CM

## 2020-03-03 DIAGNOSIS — E119 Type 2 diabetes mellitus without complications: Secondary | ICD-10-CM

## 2020-03-03 MED ORDER — LANCETS 30G MISC: 99 refills | Status: AC

## 2020-03-03 MED ORDER — ONETOUCH ULTRA 2 W/DEVICE KIT: PACK | 99 refills | Status: AC

## 2020-03-03 MED ORDER — ONETOUCH ULTRA VI STRP: ORAL_STRIP | 99 refills | Status: DC

## 2020-03-28 ENCOUNTER — Other Ambulatory Visit: Payer: Self-pay | Admitting: Family Medicine

## 2020-03-28 DIAGNOSIS — E119 Type 2 diabetes mellitus without complications: Secondary | ICD-10-CM

## 2020-04-07 DIAGNOSIS — N5201 Erectile dysfunction due to arterial insufficiency: Secondary | ICD-10-CM | POA: Diagnosis not present

## 2020-04-07 DIAGNOSIS — R3911 Hesitancy of micturition: Secondary | ICD-10-CM | POA: Diagnosis not present

## 2020-04-07 DIAGNOSIS — N138 Other obstructive and reflux uropathy: Secondary | ICD-10-CM | POA: Diagnosis not present

## 2020-04-07 DIAGNOSIS — N401 Enlarged prostate with lower urinary tract symptoms: Secondary | ICD-10-CM | POA: Diagnosis not present

## 2020-04-22 ENCOUNTER — Other Ambulatory Visit: Payer: Self-pay | Admitting: Family Medicine

## 2020-04-27 ENCOUNTER — Other Ambulatory Visit: Payer: Self-pay | Admitting: Family Medicine

## 2020-05-25 ENCOUNTER — Other Ambulatory Visit: Payer: Self-pay | Admitting: *Deleted

## 2020-05-25 DIAGNOSIS — I10 Essential (primary) hypertension: Secondary | ICD-10-CM

## 2020-05-25 MED ORDER — HYDROCHLOROTHIAZIDE 25 MG PO TABS: 25.0000 mg | ORAL_TABLET | Freq: Every day | ORAL | 1 refills | Status: DC

## 2020-05-25 MED ORDER — LOSARTAN POTASSIUM 50 MG PO TABS: 50.0000 mg | ORAL_TABLET | Freq: Every day | ORAL | 1 refills | Status: DC

## 2020-07-07 ENCOUNTER — Other Ambulatory Visit: Payer: Self-pay

## 2020-07-07 NOTE — Telephone Encounter (Signed)
Pt called stating that he did not realize that his daily dose for losartan. Per pt, he has been taking 2 tab/100 mg daily. He now has 10 days left on his rx. Pt wants to know what is the correct daily dose he should be taking? Per pt, he stated when he called for a refill, the pharmacy informed him that his request is too early. Pls advise, thanks.

## 2020-07-08 NOTE — Telephone Encounter (Signed)
If he is tolerating this well then I will send over new prescription for losartan 100 mg.  I really cannot remember if we had temporarily increased it to 2 but never updated his prescription but I will send over new prescription for the 100s which he should be able to pick up today.  Medication pended please send to preferred pharmacy.

## 2020-07-08 NOTE — Telephone Encounter (Addendum)
Pt does not want a rx for losartan 100 mg. He wants to continue on the 50 mg dose. Per pt, he is out of bp med. Pls advise, thanks.

## 2020-07-08 NOTE — Telephone Encounter (Signed)
Left a detailed vm msg for pt to return a call back to the clinic regarding losartan rx. Direct call back info provided.

## 2020-07-11 MED ORDER — LOSARTAN POTASSIUM 50 MG PO TABS: 50.0000 mg | ORAL_TABLET | Freq: Every day | ORAL | 1 refills | Status: DC

## 2020-07-14 NOTE — Telephone Encounter (Signed)
Task completed. Pt is taking losartan 50 mg/ 1 tab daily. No other inquiries during the call.

## 2020-07-25 ENCOUNTER — Telehealth: Payer: Self-pay

## 2020-07-25 NOTE — Telephone Encounter (Signed)
Brandon [name] called and left a message stating the Losartan 50 mg is not controlling his blood pressure. He would like to go back up to the Losartan 100 mg. Please advise.

## 2020-07-26 MED ORDER — LOSARTAN POTASSIUM 100 MG PO TABS: 100.0000 mg | ORAL_TABLET | Freq: Every day | ORAL | 1 refills | Status: DC

## 2020-07-26 NOTE — Telephone Encounter (Signed)
OK, new rx sent for 100 mg dose.  Please also verify if he has had a flu shot.

## 2020-07-26 NOTE — Telephone Encounter (Signed)
Patient aware that a new prescription was sent in for the higher dose.  He reports that he has an appt at the [place] next week and he plans to get his flu shot there.

## 2020-08-22 ENCOUNTER — Other Ambulatory Visit: Payer: Self-pay | Admitting: Family Medicine

## 2020-08-22 DIAGNOSIS — A6000 Herpesviral infection of urogenital system, unspecified: Secondary | ICD-10-CM

## 2020-09-06 DIAGNOSIS — Z23 Encounter for immunization: Secondary | ICD-10-CM | POA: Diagnosis not present

## 2020-10-04 ENCOUNTER — Telehealth (INDEPENDENT_AMBULATORY_CARE_PROVIDER_SITE_OTHER): Payer: Medicare Other | Admitting: Family Medicine

## 2020-10-04 ENCOUNTER — Encounter: Payer: Self-pay | Admitting: Family Medicine

## 2020-10-04 DIAGNOSIS — R202 Paresthesia of skin: Secondary | ICD-10-CM | POA: Diagnosis not present

## 2020-10-04 DIAGNOSIS — G43101 Migraine with aura, not intractable, with status migrainosus: Secondary | ICD-10-CM

## 2020-10-04 DIAGNOSIS — J342 Deviated nasal septum: Secondary | ICD-10-CM | POA: Diagnosis not present

## 2020-10-04 DIAGNOSIS — I10 Essential (primary) hypertension: Secondary | ICD-10-CM | POA: Diagnosis not present

## 2020-10-04 DIAGNOSIS — H9313 Tinnitus, bilateral: Secondary | ICD-10-CM | POA: Insufficient documentation

## 2020-10-04 DIAGNOSIS — R519 Headache, unspecified: Secondary | ICD-10-CM | POA: Diagnosis not present

## 2020-10-04 MED ORDER — HYDROCHLOROTHIAZIDE 25 MG PO TABS: 25.0000 mg | ORAL_TABLET | Freq: Every day | ORAL | 2 refills | Status: DC

## 2020-10-04 NOTE — Assessment & Plan Note (Signed)
Secondary to old nasal fracture.

## 2020-10-04 NOTE — Progress Notes (Signed)
Colonoscopy/endoscopy done on Friday.   He said that they are going to do a CT scan for something found on his endoscopy.   Pt reports that his sinus symptoms got worse along with headaches  Would like to like to discuss how this is connected to his service related medical issues and will possibly need referral to ENT again for this

## 2020-10-04 NOTE — Progress Notes (Signed)
Virtual Visit via Telephone Note  I connected with Brandon [name] on 10/04/20 at  1:00 PM EST by telephone and verified that I am speaking with the correct person using two identifiers.   I discussed the limitations, risks, security and privacy concerns of performing an evaluation and management service by telephone and the availability of in person appointments. I also discussed with the patient that there may be a patient responsible charge related to this service. The patient expressed understanding and agreed to proceed.  Patient location: at home Provider loccation: In office   Subjective:    CC:   HPI: Colonoscopy/endoscopy done on Friday. He said that they are going to do a CT scan of chest for something found on his endoscopy. He was in ampicillin for his sinuses. Finished it a couple of weeks ago. No energy.   Pt reports that his sinus symptoms got worse along with headaches. Still getting fatigue and weakness. Taking hsi Topamax and sometime helps but not always.   Would like to like to discuss how this is connected to his service related medical issues and will possibly need referral to ENT again for this. 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 He also has tinnitus and wonders if that can be related.  Hx of loud noise exposure   Has been having tingling in his fingers and toes. Have more days with it than not with it.  Can month in between when it happens. Does have chronic neck and back pain.    Reports has been evaluated for sleep apnea.  Being followed by Dr. Halford Chessman for this.  Been CPAP.    Past medical history, Surgical history, Family history not pertinant except as noted below, Social history, Allergies, and medications have been entered into the medical record, reviewed, and corrections made.   Review of Systems: No fevers, chills, night sweats, weight loss, chest pain, or shortness of breath.   Objective:    General: Speaking  clearly in complete sentences without any shortness of breath.  Alert and oriented x3.  Normal judgment. No apparent acute distress.    Impression and Recommendations:    Migraine with aura and with status migrainosus, not intractable Still having frequent headaches.  I do not know if this is really related to the frequent fatigue or not.  He has been recently treated for sinusitis as well.  At this point I still think this is migraine related and would like to refer him to neurology just to make sure that his diagnosis is accurate.  I suspect he is having a combination of sinus infections and migraines.  He has been on Topamax 50 mg twice a day but I do not really feel like he has had a great response.  He feels like sometimes it helps.  Tingling in extremities Unclear etiology.  Discussed that typically this will be worked up to look for deficiencies he says the [place] did do some labs so he does think that they have looked and evaluated for some of these things.  Also could consider nerve conduction study.  He does have chronic spine pain so certainly there could be an issue with his neck going on.  He has a follow-up scheduled up with it believe either later this month or early next month and can address it further then.  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  Unfortunately there are not a lot  of treatments for this.  And it can fluctuate in severity and intensity.  We can go ahead and place referral to ENT as well.  Deviated septum Secondary to old nasal fracture.     Referral placed to neurology for the headaches.    I discussed the assessment and treatment plan with the patient. The patient was provided an opportunity to ask questions and all were answered. The patient agreed with the plan and demonstrated an understanding of the instructions.   The patient was advised to call back or seek an in-person evaluation if the symptoms worsen or  if the condition fails to improve as anticipated.  I provided 28 minutes of non-face-to-face time during this encounter.   Beatrice Lecher, MD

## 2020-10-04 NOTE — Assessment & Plan Note (Signed)
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  Unfortunately there are not a lot of treatments for this.  And it can fluctuate in severity and intensity.  We can go ahead and place referral to ENT as well.

## 2020-10-04 NOTE — Assessment & Plan Note (Signed)
Unclear etiology.  Discussed that typically this will be worked up to look for deficiencies he says the [place] did do some labs so he does think that they have looked and evaluated for some of these things.  Also could consider nerve conduction study.  He does have chronic spine pain so certainly there could be an issue with his neck going on.  He has a follow-up scheduled up with it believe either later this month or early next month and can address it further then.

## 2020-10-04 NOTE — Assessment & Plan Note (Signed)
Still having frequent headaches.  I do not know if this is really related to the frequent fatigue or not.  He has been recently treated for sinusitis as well.  At this point I still think this is migraine related and would like to refer him to neurology just to make sure that his diagnosis is accurate.  I suspect he is having a combination of sinus infections and migraines.  He has been on Topamax 50 mg twice a day but I do not really feel like he has had a great response.  He feels like sometimes it helps.

## 2020-10-17 ENCOUNTER — Other Ambulatory Visit: Payer: Self-pay

## 2020-10-17 MED ORDER — LOSARTAN POTASSIUM 100 MG PO TABS: 100.0000 mg | ORAL_TABLET | Freq: Every day | ORAL | 1 refills | Status: DC

## 2020-10-25 ENCOUNTER — Encounter: Payer: Self-pay | Admitting: Neurology

## 2020-10-28 ENCOUNTER — Other Ambulatory Visit: Payer: Self-pay | Admitting: Family Medicine

## 2020-10-28 DIAGNOSIS — G43701 Chronic migraine without aura, not intractable, with status migrainosus: Secondary | ICD-10-CM

## 2020-11-02 ENCOUNTER — Other Ambulatory Visit: Payer: Self-pay

## 2020-11-02 ENCOUNTER — Encounter: Payer: Self-pay | Admitting: Pulmonary Disease

## 2020-11-02 ENCOUNTER — Ambulatory Visit (INDEPENDENT_AMBULATORY_CARE_PROVIDER_SITE_OTHER): Payer: Medicare Other | Admitting: Pulmonary Disease

## 2020-11-02 VITALS — BP 112/62 | HR 72 | Ht 67.0 in | Wt 198.0 lb

## 2020-11-02 DIAGNOSIS — G4733 Obstructive sleep apnea (adult) (pediatric): Secondary | ICD-10-CM

## 2020-11-02 DIAGNOSIS — J310 Chronic rhinitis: Secondary | ICD-10-CM | POA: Diagnosis not present

## 2020-11-02 DIAGNOSIS — J4520 Mild intermittent asthma, uncomplicated: Secondary | ICD-10-CM

## 2020-11-02 DIAGNOSIS — Z9989 Dependence on other enabling machines and devices: Secondary | ICD-10-CM

## 2020-11-02 DIAGNOSIS — J342 Deviated nasal septum: Secondary | ICD-10-CM | POA: Diagnosis not present

## 2020-11-02 NOTE — Patient Instructions (Signed)
Will arrange for refitting of your CPAP mask ? ?Follow up in 1 year ?

## 2020-11-02 NOTE — Progress Notes (Signed)
[facility]  Chief Complaint  Patient presents with  . Follow-up    Pt states he has been doing good since last visit and denies any real complaints with his cpap.    Constitutional:  BP 112/62 (BP Location: Left Arm, Cuff Size: Normal)   Pulse 72   Ht _0  (1.702 m)   Wt 198 lb (89.8 kg)   SpO2 98%   BMI 31.01 kg/m   Past Medical History:  ED, GERD, Hep C, HLD, HTN, BPH, Lumbar spinal stenosis, Delta beta thalassemia  Past Surgical History:  He  has a past surgical history that includes Foot surgery (Right); Foot surgery (Left); and Inguinal hernia repair (Right).  Brief Summary:  Brandon [name] is a 70 y.o. male former smoker with amild intermittent asthma, upper airway cough, OSA, and lung nodule.      Subjective:   He continues to have sinus congestion and drainage.  He coughs up clear sputum.  Has been using zyrtec, flonase, atrovent, singulair, neti pot.  He reports that he broke his nose while in [employer]  He has deviated nasal septum.  He has appointment with Dr. Melony Overly schedule for later this month.  He uses albuterol intermittently.  Was switched from symbicort to spiriva through the [place].  He feels this has been working well.  He uses CPAP nightly.  Still has trouble with mask leak.  Pressure setting feels okay.  Physical Exam:   Appearance - well kempt   ENMT - no sinus tenderness, no oral exudate, no LAN, Mallampati 4 airway, no stridor, deviated septum  Respiratory - equal breath sounds bilaterally, no wheezing or rales  CV - s1s2 regular rate and rhythm, no murmurs  Ext - no clubbing, no edema  Skin - no rashes  Psych - normal mood and affect   Pulmonary testing:   Spirometry 02/14/18 >> FVC 70%, FEV1 73%, FEV1% 77  RAST 02/20/18 >> negative, IgE 15  PFT 03/10/18 >> FEV1 2.47 (93%), FEV1% 83, FEF 25-75% 1.78 (75%), TLC 5.15 (79%), DLCO 71%, no BD  Chest Imaging:   CT chest 01/18/17  >> calcified granulomas, 5 mm nodule LUL, 5 mm nodule LLL, centrilobular nodularity RML  CT chest 03/07/18 >> scattered nodules up to 6 mm  CT chest 09/10/18 >> 6 mm subpleural nodule LLL, 6 mm nodule RML no change  CT chest 09/09/19 >> few nodules up to 6 mm in RML no change  Sleep Tests:   ONO with RA 03/06/18 >>test time 7 hrs 25 min. Average SpO2 96%, low SpO2 92%.  CPAP 10/02/20 to 10/31/20 >> used on 30 of 30 nights with average 7 hrs 46 min.  Average AHI 10.6 with CPAP 12 cm H2O.  Air leak.  Social History:  He  reports that he quit smoking about 24 years ago. His smoking use included cigarettes. He has a 22.50 pack-year smoking history. He has never used smokeless tobacco. He reports that he does not drink alcohol and does not use drugs.  Family History:  His family history includes Breast cancer in his sister; Depression in his sister; Heart disease in his brother; Hyperlipidemia in an other family member; Hypertension in an other family member; Ovarian cancer in his sister.     Assessment/Plan:   Mild, persistent asthma. - continue singulair - he was prescribed spiriva from the [place] and he feels this works better than symbicort; continue spiriva - prn albuterol  Upper airway cough  from chronic rhinitis with deviated nasal septum. - continue singulair, atrovent nasal spray, flonase, zyrtec, nasal irrigation - he has appointment with Dr. Melony Overly with ENT scheduled for later this month  Obstructive sleep apnea. - he is compliant with CPAP and reports benefit from therapy - he gets supplies from the [place] - continue CPAP 12 cm H2O - will see if he can get mask refitting  Time Spent Involved in Patient Care on Day of Examination:  23 minutes  Follow up:  Patient Instructions  Will arrange for refitting of your CPAP mask  Follow up in 1 year   Medication List:   Allergies as of 11/02/2020      Reactions   Amlodipine Rash   Atorvastatin Other (See  Comments)   Myalgias      Medication List       Accurate as of November 02, 2020 12:15 PM. If you have any questions, ask your nurse or doctor.        STOP taking these medications   budesonide-formoterol 80-4.5 MCG/ACT inhaler Commonly known as: Symbicort Stopped by: Chesley Mires, MD   omeprazole 40 MG capsule Commonly known as: PRILOSEC Stopped by: Chesley Mires, MD     TAKE these medications   albuterol 108 (90 Base) MCG/ACT inhaler Commonly known as: ProAir HFA Inhale 2 puffs into the lungs every 6 (six) hours as needed for wheezing or shortness of breath.   cetirizine 10 MG tablet Commonly known as: ZYRTEC TAKE 1 TABLET BY MOUTH EVERY DAY   CINNAMON PO Take 2 capsules by mouth daily.   clindamycin 1 % lotion Commonly known as: CLEOCIN T Apply topically 2 (two) times daily.   clindamycin 1 % Swab Commonly known as: CLEOCIN T   clotrimazole-betamethasone cream Commonly known as: LOTRISONE Apply topically 2 (two) times daily.   cyclobenzaprine 10 MG tablet Commonly known as: FLEXERIL   diclofenac sodium 1 % Gel Commonly known as: VOLTAREN Apply 2 g topically 4 (four) times daily. To affected areas   DULoxetine 30 MG capsule Commonly known as: CYMBALTA Take 30 mg by mouth daily. Take one capsule by mouth for 14 days then take 2 capsules daily for mood.   Fish Oil 1000 MG Caps Take by mouth.   fluticasone 50 MCG/ACT nasal spray Commonly known as: FLONASE Place 2 sprays into both nostrils daily.   hydrochlorothiazide 25 MG tablet Commonly known as: HYDRODIURIL Take 1 tablet (25 mg total) by mouth daily.   hydrocortisone 2.5 % cream Apply topically as needed. Reported on 04/19/2016   hydrocortisone 25 MG suppository Commonly known as: ANUSOL-HC Place 25 mg rectally as needed. Reported on 04/19/2016   ipratropium 0.06 % nasal spray Commonly known as: Atrovent Place 2 sprays into both nostrils every 4 (four) hours as needed for rhinitis.   IRON  FORMULA PO Take by mouth daily. In liquid form   Lancets 30G Misc DX DM E11.9 Check fasting blood sugar 2-3 times weekly.   losartan 100 MG tablet Commonly known as: COZAAR Take 1 tablet (100 mg total) by mouth daily.   montelukast 10 MG tablet Commonly known as: SINGULAIR TAKE 1 TABLET BY MOUTH AT BEDTIME.   multivitamin tablet Take 1 tablet by mouth daily.   Naftifine HCl 2 % Crea APPLY 1 APPLICATION TOPICALLY 2 TIMES A DAY.   ONE TOUCH ULTRA 2 w/Device Kit DX DM E11.9 Check fasting blood sugar 2-3 times weekly.   OneTouch Ultra test strip Generic drug: glucose blood CHECK FASTING BLOOD  SUGAR 2-3 TIMES WEEKLY.   pantoprazole 40 MG tablet Commonly known as: PROTONIX Take 40 mg by mouth 2 (two) times daily.   rosuvastatin 10 MG tablet Commonly known as: CRESTOR TAKE 1 TABLET BY MOUTH EVERYDAY AT BEDTIME   sertraline 100 MG tablet Commonly known as: ZOLOFT Take 1 tablet by mouth daily.   tadalafil 5 MG tablet Commonly known as: CIALIS Take 5 mg by mouth daily.   tamsulosin 0.4 MG Caps capsule Commonly known as: FLOMAX Take 0.4 mg by mouth.   terbinafine 1 % cream Commonly known as: LAMISIL Apply 1 application topically 2 (two) times daily.   tiotropium 18 MCG inhalation capsule Commonly known as: SPIRIVA Place 18 mcg into inhaler and inhale daily.   topiramate 50 MG tablet Commonly known as: TOPAMAX TAKE 1 TABLET BY MOUTH TWICE A DAY   TRAZODONE HCL PO Take 100 mg by mouth daily.   triamcinolone ointment 0.1 % Commonly known as: KENALOG   valACYclovir 500 MG tablet Commonly known as: VALTREX TAKE 1 TABLET BY MOUTH EVERY DAY   VITAMIN D3 PO Take 1 tablet by mouth daily.       Signature:  Chesley Mires, MD [place] Pager - [phone] 11/02/2020, 12:15 PM

## 2020-11-16 ENCOUNTER — Other Ambulatory Visit: Payer: Self-pay

## 2020-11-16 ENCOUNTER — Other Ambulatory Visit (INDEPENDENT_AMBULATORY_CARE_PROVIDER_SITE_OTHER): Payer: Self-pay

## 2020-11-16 ENCOUNTER — Encounter (INDEPENDENT_AMBULATORY_CARE_PROVIDER_SITE_OTHER): Payer: Self-pay | Admitting: Otolaryngology

## 2020-11-16 ENCOUNTER — Ambulatory Visit (INDEPENDENT_AMBULATORY_CARE_PROVIDER_SITE_OTHER): Payer: Medicare Other | Admitting: Otolaryngology

## 2020-11-16 VITALS — Temp 97.3°F

## 2020-11-16 DIAGNOSIS — J324 Chronic pansinusitis: Secondary | ICD-10-CM | POA: Diagnosis not present

## 2020-11-16 DIAGNOSIS — J329 Chronic sinusitis, unspecified: Secondary | ICD-10-CM

## 2020-11-16 DIAGNOSIS — J342 Deviated nasal septum: Secondary | ICD-10-CM

## 2020-11-16 DIAGNOSIS — H903 Sensorineural hearing loss, bilateral: Secondary | ICD-10-CM | POA: Diagnosis not present

## 2020-11-16 DIAGNOSIS — J310 Chronic rhinitis: Secondary | ICD-10-CM

## 2020-11-16 NOTE — Progress Notes (Addendum)
HPI: Brandon [name] is a 70 y.o. male who presents is referred by his PCP Dr. Ward Chatters for evaluation of chronic "sinus" issues.  He always has difficulty breathing through his nose.  He describes frequent "sinus headaches".  He also complains of chronic nasal discharge and occasional colored discharge.  He has also been diagnosed with migraine headaches previously. He uses Flonase regularly in the mornings as well as Atrovent for the drainage and or Zyrtec and Singulair.  He also occasionally uses Nettie pot.  He has received several rounds of antibiotics for sinus infections.  He receives some of his medical care at the [facility] and said he had a sinus x-ray several years ago that showed sinusitis.  He was also diagnosed with a deviated septum. He also has obstructive sleep apnea and has been using a CPAP machine for the past 3 years.  Apparently has severe obstructive sleep apnea He is also scheduled to get a heart catheterization performed at the [facility] in the next month..  Past Medical History:  Diagnosis Date   ED (erectile dysfunction)    GERD (gastroesophageal reflux disease)    Hepatitis C    [phone], treated at [facility]   Hyperlipidemia    Hypertension    Nose fracture    Past Surgical History:  Procedure Laterality Date   FOOT SURGERY Right    1980   FOOT SURGERY Left    1980   INGUINAL HERNIA REPAIR Right    2009   Social History   Socioeconomic History   Marital status: Married    Spouse name: Tarrell Debes   Number of children: 4   Years of education: 14   Highest education level: Associate degree: academic program  Occupational History   Occupation: Investment banker, corporate Rep    Comment: Dept of [employer] in [employer] [place]  Tobacco Use   Smoking status: Former Smoker    Packs/day: 1.50    Years: 15.00    Pack years: 22.50    Types: Cigarettes    Quit date: 10/22/1986    Years since quitting: 34.0   Smokeless tobacco: Never Used   Tobacco  comment: SmokE off and on  Vaping Use   Vaping Use: Never used  Substance and Sexual Activity   Alcohol use: No   Drug use: No   Sexual activity: Yes    Partners: Female  Other Topics Concern   Not on file  Social History Narrative   No caffeine.  Has a 2 years associate degree. Previously worked at [employer] for 12 years. Also work to [employer].   Social Determinants of Health   Financial Resource Strain: Not on file  Food Insecurity: Not on file  Transportation Needs: Not on file  Physical Activity: Not on file  Stress: Not on file  Social Connections: Not on file   Family History  Problem Relation Age of Onset   Breast cancer Sister    Depression Sister    Hyperlipidemia Other    Ovarian cancer Sister    Hypertension Other    Heart disease Brother    Allergies  Allergen Reactions   Amlodipine Rash   Atorvastatin Other (See Comments)    Myalgias   Prior to Admission medications   Medication Sig Start Date End Date Taking? Authorizing Provider  albuterol (PROAIR HFA) 108 (90 Base) MCG/ACT inhaler Inhale 2 puffs into the lungs every 6 (six) hours as needed for wheezing or shortness of breath. 01/30/18   Hali Marry, MD  Blood Glucose Monitoring Suppl (ONE TOUCH ULTRA 2) w/Device KIT DX DM E11.9 Check fasting blood sugar 2-3 times weekly. 03/03/20   Hali Marry, MD  cetirizine (ZYRTEC) 10 MG tablet TAKE 1 TABLET BY MOUTH EVERY DAY 11/09/19   Emeterio Reeve, DO  Cholecalciferol (VITAMIN D3 PO) Take 1 tablet by mouth daily.    [provider]  CINNAMON PO Take 2 capsules by mouth daily.    [provider]  clindamycin (CLEOCIN T) 1 % lotion Apply topically 2 (two) times daily.    [provider]  clindamycin (CLEOCIN T) 1 % SWAB  04/17/16   [provider]  clotrimazole-betamethasone (LOTRISONE) cream Apply topically 2 (two) times daily. 05/16/17   Hali Marry, MD  cyclobenzaprine (FLEXERIL) 10 MG tablet   08/01/19   [provider]  diclofenac sodium (VOLTAREN) 1 % GEL Apply 2 g topically 4 (four) times daily. To affected areas 01/30/18   Hali Marry, MD  DULoxetine (CYMBALTA) 30 MG capsule Take 30 mg by mouth daily. Take one capsule by mouth for 14 days then take 2 capsules daily for mood.    [provider]  fluticasone (FLONASE) 50 MCG/ACT nasal spray Place 2 sprays into both nostrils daily. 04/19/16   Gregor Hams, MD  glucose blood (ONETOUCH ULTRA) test strip CHECK FASTING BLOOD SUGAR 2-3 TIMES WEEKLY. 03/28/20   Hali Marry, MD  hydrochlorothiazide (HYDRODIURIL) 25 MG tablet Take 1 tablet (25 mg total) by mouth daily. 10/04/20   Hali Marry, MD  hydrocortisone (ANUSOL-HC) 25 MG suppository Place 25 mg rectally as needed. Reported on 04/19/2016    [provider]  hydrocortisone 2.5 % cream Apply topically as needed. Reported on 04/19/2016    [provider]  ipratropium (ATROVENT) 0.06 % nasal spray Place 2 sprays into both nostrils every 4 (four) hours as needed for rhinitis. 04/19/16   Gregor Hams, MD  Iron-Folic Acid-Vit B93 (IRON FORMULA PO) Take by mouth daily. In liquid form    [provider]  Lancets 30G MISC DX DM E11.9 Check fasting blood sugar 2-3 times weekly. 03/03/20   Hali Marry, MD  losartan (COZAAR) 100 MG tablet Take 1 tablet (100 mg total) by mouth daily. 10/17/20   Hali Marry, MD  montelukast (SINGULAIR) 10 MG tablet TAKE 1 TABLET BY MOUTH AT BEDTIME. 05/12/18   Hali Marry, MD  Multiple Vitamin (MULTIVITAMIN) tablet Take 1 tablet by mouth daily.    [provider]  Naftifine HCl 2 % CREA APPLY 1 APPLICATION TOPICALLY 2 TIMES A DAY. 04/25/20   Hali Marry, MD  Omega-3 Fatty Acids (FISH OIL) 1000 MG CAPS Take by mouth.    [provider]  pantoprazole (PROTONIX) 40 MG tablet Take 40 mg by mouth 2 (two) times daily. 10/17/20   [provider]   rosuvastatin (CRESTOR) 10 MG tablet TAKE 1 TABLET BY MOUTH EVERYDAY AT BEDTIME 04/27/20   Hali Marry, MD  sertraline (ZOLOFT) 100 MG tablet Take 1 tablet by mouth daily. 11/13/18   [provider]  tadalafil (CIALIS) 5 MG tablet Take 5 mg by mouth daily. 11/11/17   [provider]  tamsulosin (FLOMAX) 0.4 MG CAPS capsule Take 0.4 mg by mouth.    [provider]  terbinafine (LAMISIL) 1 % cream Apply 1 application topically 2 (two) times daily.    [provider]  tiotropium (SPIRIVA) 18 MCG inhalation capsule Place 18 mcg into inhaler and  inhale daily.    [provider]  topiramate (TOPAMAX) 50 MG tablet TAKE 1 TABLET BY MOUTH TWICE A DAY 10/28/20   Breeback, Jade L, PA-C  TRAZODONE HCL PO Take 100 mg by mouth daily.    [provider]  triamcinolone ointment (KENALOG) 0.1 %  08/31/19   [provider]  valACYclovir (VALTREX) 500 MG tablet TAKE 1 TABLET BY MOUTH EVERY DAY 08/22/20   Hali Marry, MD     Positive ROS: Otherwise negative  All other systems have been reviewed and were otherwise negative with the exception of those mentioned in the HPI and as above.  Physical Exam: Constitutional: Alert, well-appearing, no acute distress Ears: External ears without lesions or tenderness. Ear canals are clear bilaterally with intact, clear TMs.  He has decreased hearing on tuning fork testing with AC > BC bilaterally. Nasal: External nose without lesions. Septum is mildly deviated to the right..  He has a lot of swelling of his mucous membranes on both sides.  On nasal endoscopy he has moderately severe deviated septum.  No obvious polyps noted although he has thick mucus discharge which has some slight color to it consistent with chronic sinusitis.  Nasopharynx was clear and the eustachian tube opening was unobstructed on both sides. Oral: Lips and gums without lesions. Tongue and palate mucosa without lesions. Posterior  oropharynx clear.  Average sized tonsils which are symmetric bilaterally. Neck: No palpable adenopathy or masses Respiratory: Breathing comfortably  Skin: No facial/neck lesions or rash noted.  Procedures  Assessment: Septal deviation with chronic rhinitis and probable chronic sinusitis. Sensorineural hearing loss with eustachian tube dysfunction.  Plan: Recommended changing his use of Flonase to 2 sprays in the evenings before bed instead of in the first thing in the morning. Placed him on Augmentin 875 mg twice daily for 10 days and we will plan on obtaining a CT scan of his sinuses following the antibiotics. He would be a surgical candidate improve his nasal airway as well as the sinuses however his cardiac work-up at the [place] will have to be cleared first. He will follow up here following the CT scan of the sinuses to review this and discuss options.  He will get a audiogram at the follow-up visit to review his hearing loss.   Radene Journey, MD   CC:

## 2020-11-30 ENCOUNTER — Ambulatory Visit
Admission: RE | Admit: 2020-11-30 | Discharge: 2020-11-30 | Disposition: A | Discharge status: Home / Self Care | Payer: Medicare Other | Source: Ambulatory Visit | Attending: Otolaryngology | Admitting: Otolaryngology

## 2020-11-30 DIAGNOSIS — J321 Chronic frontal sinusitis: Secondary | ICD-10-CM | POA: Diagnosis not present

## 2020-11-30 DIAGNOSIS — J0110 Acute frontal sinusitis, unspecified: Secondary | ICD-10-CM | POA: Diagnosis not present

## 2020-11-30 DIAGNOSIS — J329 Chronic sinusitis, unspecified: Secondary | ICD-10-CM

## 2020-12-02 ENCOUNTER — Other Ambulatory Visit: Payer: Self-pay

## 2020-12-02 MED ORDER — LOSARTAN POTASSIUM 100 MG PO TABS: 100.0000 mg | ORAL_TABLET | Freq: Every day | ORAL | 1 refills | Status: DC

## 2020-12-14 ENCOUNTER — Ambulatory Visit (INDEPENDENT_AMBULATORY_CARE_PROVIDER_SITE_OTHER): Payer: Medicare Other | Admitting: Otolaryngology

## 2020-12-19 NOTE — Progress Notes (Signed)
NEUROLOGY CONSULTATION NOTE  Brandon [name] MRN: [id number] DOB: 06/28/1951  Referring provider: Beatrice Lecher, MD Primary care provider: Beatrice Lecher, MD  Reason for consult:  migraines  Assessment/Plan:   Migraine without aura, without status migrainosus, not intractable  1.  Migraine prevention:  Emgality.  Continue topiramate for now. 2.  Migraine rescue:  Tylenol but Limit use of pain relievers to no more than 2 days out of week to prevent risk of rebound or medication-overuse headache. 3.  Keep headache diary 4.  Follow up 4 to 6 months   Subjective:  Brandon [name] is a 70 year old right-handed male with HTN, HLD and hepatitis C who presents for migraines.  History supplemented by referring provider's note.  Onset:  Started after fracturing his nose in football in young adulthood.  Started having sinus issues and headaches.  Hasd deviated septum.  History of recurrent sinusitis. Location:  Bifrontal and top of head, face Quality:  pounding Intensity:   Severe.   He denies new headache, thunderclap headache or severe headache that wakes him from sleep. Aura:  no Associated symptoms:  Nausea, photophobia, phonophobia, mild dizziness, mild blurred vision.[name]  He denies associated unilateral numbness or weakness. Duration:  Several hours.  He has had headaches that lasted several days. Frequency:  6-7 days in February Frequency of abortive medication: at least 3 days a week Triggers:  Seasonal allergies and sinus infections Relieving factors:  rest Activity:  aggravates  Treats with Tylenol Current NSAIDS/analgesics:  Tylenol 545m Current triptans:  none Current ergotamine:  none Current anti-emetic:  none Current muscle relaxants:  Flexeril 166mCurrent Antihypertensive medications:  HCTZ, losartan Current Antidepressant medications: sertraline 10022murrent Anticonvulsant medications:  topiramate 34m84mice daily Current anti-CGRP:  none Current  Vitamins/Herbal/Supplements:  MVI Current Antihistamines/Decongestants:  Flonase, Zyrtec, Singulair Other therapy:  none Hormone/birth control:  none   Past NSAIDS/analgesics:  Tramadol, ibuprofen 800mg34mt abortive triptans:  none Past abortive ergotamine:  none Past muscle relaxants:  baclofen Past anti-emetic:  none Past antihypertensive medications:  Verapamil, amlodipine Past antidepressant medications:  Cybalta Past anticonvulsant medications:  none Past anti-CGRP:  none Past vitamins/Herbal/Supplements:  none Past antihistamines/decongestants:  none Other past therapies:  none  Caffeine:  No coffee/soda Diet:  Hydrates with water.  Does not skip meals  Exercise:  walks Depression:  yes; Anxiety:  yes Other pain:  Back pain Sleep hygiene:  varies Family history of headache:  no    BMP from April was unremarkable.  PAST MEDICAL HISTORY: Past Medical History:  Diagnosis Date  . ED (erectile dysfunction)   . GERD (gastroesophageal reflux disease)   . Hepatitis C    [phone]   . Hypertension   . Nose fracture     PAST SURGICAL HISTORY: Past Surgical History:  Procedure Laterality Date  . FOOT SURGERY Right    1980  . FOOT SURGERY Left    1980  . INGUINAL HERNIA REPAIR Right    2009    MEDICATIONS: Current Outpatient Medications on File Prior to Visit  Medication Sig Dispense Refill  . albuterol (PROAIR HFA) 108 (90 Base) MCG/ACT inhaler Inhale 2 puffs into the lungs every 6 (six) hours as needed for wheezing or shortness of breath. 18 g 4  . Blood Glucose Monitoring Suppl (ONE TOUCH ULTRA 2) w/Device KIT DX DM E11.9 Check fasting blood sugar 2-3 times weekly. 1 kit prn  . cetirizine (ZYRTEC) 10 MG tablet TAKE 1 TABLET  BY MOUTH EVERY DAY 90 tablet 3  . Cholecalciferol (VITAMIN D3 PO) Take 1 tablet by mouth daily.    [name] CINNAMON PO Take 2 capsules by mouth daily.    . clindamycin (CLEOCIN T) 1 % lotion Apply topically  2 (two) times daily.    . clindamycin (CLEOCIN T) 1 % SWAB     . clotrimazole-betamethasone (LOTRISONE) cream Apply topically 2 (two) times daily. 45 g 3  . cyclobenzaprine (FLEXERIL) 10 MG tablet     . diclofenac sodium (VOLTAREN) 1 % GEL Apply 2 g topically 4 (four) times daily. To affected areas 400 g PRN  . DULoxetine (CYMBALTA) 30 MG capsule Take 30 mg by mouth daily. Take one capsule by mouth for 14 days then take 2 capsules daily for mood.    . fluticasone (FLONASE) 50 MCG/ACT nasal spray Place 2 sprays into both nostrils daily. 16 g 2  . glucose blood (ONETOUCH ULTRA) test strip CHECK FASTING BLOOD SUGAR 2-3 TIMES WEEKLY. 50 strip 25  . hydrochlorothiazide (HYDRODIURIL) 25 MG tablet Take 1 tablet (25 mg total) by mouth daily. 90 tablet 2  . hydrocortisone (ANUSOL-HC) 25 MG suppository Place 25 mg rectally as needed. Reported on 04/19/2016    . hydrocortisone 2.5 % cream Apply topically as needed. Reported on 04/19/2016    . ipratropium (ATROVENT) 0.06 % nasal spray Place 2 sprays into both nostrils every 4 (four) hours as needed for rhinitis. 10 mL 6  . Iron-Folic Acid-Vit L93 (IRON FORMULA PO) Take by mouth daily. In liquid form    . Lancets 30G MISC DX DM E11.9 Check fasting blood sugar 2-3 times weekly. 100 each prn  . losartan (COZAAR) 100 MG tablet Take 1 tablet (100 mg total) by mouth daily. 90 tablet 1  . montelukast (SINGULAIR) 10 MG tablet TAKE 1 TABLET BY MOUTH AT BEDTIME. 30 tablet 5  . Multiple Vitamin (MULTIVITAMIN) tablet Take 1 tablet by mouth daily.    . Naftifine HCl 2 % CREA APPLY 1 APPLICATION TOPICALLY 2 TIMES A DAY. 60 g 11  . Omega-3 Fatty Acids (FISH OIL) 1000 MG CAPS Take by mouth.    . pantoprazole (PROTONIX) 40 MG tablet Take 40 mg by mouth 2 (two) times daily.    . rosuvastatin (CRESTOR) 10 MG tablet TAKE 1 TABLET BY MOUTH EVERYDAY AT BEDTIME 90 tablet 3  . sertraline (ZOLOFT) 100 MG tablet Take 1 tablet by mouth daily.    . tadalafil (CIALIS) 5 MG tablet Take 5  mg by mouth daily.  11  . tamsulosin (FLOMAX) 0.4 MG CAPS capsule Take 0.4 mg by mouth.    . terbinafine (LAMISIL) 1 % cream Apply 1 application topically 2 (two) times daily.    [name] tiotropium (SPIRIVA) 18 MCG inhalation capsule Place 18 mcg into inhaler and inhale daily.    [name] topiramate (TOPAMAX) 50 MG tablet TAKE 1 TABLET BY MOUTH TWICE A DAY 180 tablet 0  . TRAZODONE HCL PO Take 100 mg by mouth daily.    [name] triamcinolone ointment (KENALOG) 0.1 %     . valACYclovir (VALTREX) 500 MG tablet TAKE 1 TABLET BY MOUTH EVERY DAY 90 tablet 1   No current facility-administered medications on file prior to visit.    ALLERGIES: Allergies  Allergen Reactions  . Amlodipine Rash  . Atorvastatin Other (See Comments)    Myalgias    FAMILY HISTORY: Family History  Problem Relation Age of Onset  . Breast cancer Sister   . Depression Sister   .  Hyperlipidemia Other   . Ovarian cancer Sister   . Hypertension Other   . Heart disease Brother    Objective:  Blood pressure 130/74, pulse 77, height _0  (1.676 m), weight 197 lb 6.4 oz (89.5 kg), SpO2 95 %. General: No acute distress.  Patient appears well-groomed.   Head:  Normocephalic/atraumatic Eyes:  fundi examined but not visualized Neck: supple, no paraspinal tenderness, full range of motion Back: No paraspinal tenderness Heart: regular rate and rhythm Lungs: Clear to auscultation bilaterally. Vascular: No carotid bruits. Neurological Exam: Mental status: alert and oriented to person, place, and time, recent and remote memory intact, fund of knowledge intact, attention and concentration intact, speech fluent and not dysarthric, language intact. Cranial nerves: CN I: not tested CN II: pupils equal, round and reactive to light, visual fields intact CN III, IV, VI:  full range of motion, no nystagmus, no ptosis CN V: facial sensation intact. CN VII: upper and lower face symmetric CN VIII: hearing intact CN IX, X: gag intact, uvula  midline CN XI: sternocleidomastoid and trapezius muscles intact CN XII: tongue midline Bulk & Tone: normal, no fasciculations. Motor:  muscle strength 5/5 throughout Sensation:  Pinprick, temperature and vibratory sensation intact. Deep Tendon Reflexes:  2+ throughout,  toes downgoing.   Finger to nose testing:  Without dysmetria.   Heel to shin:  Without dysmetria.   Gait:  Normal station and stride.  Romberg negative.    Thank you for allowing me to take part in the care of this patient.  Metta Clines, DO  CC: Beatrice Lecher, MD

## 2020-12-21 ENCOUNTER — Encounter: Payer: Self-pay | Admitting: Neurology

## 2020-12-21 ENCOUNTER — Ambulatory Visit (INDEPENDENT_AMBULATORY_CARE_PROVIDER_SITE_OTHER): Payer: Medicare Other | Admitting: Neurology

## 2020-12-21 ENCOUNTER — Other Ambulatory Visit: Payer: Self-pay

## 2020-12-21 VITALS — BP 130/74 | HR 77 | Ht 66.0 in | Wt 197.4 lb

## 2020-12-21 DIAGNOSIS — G43009 Migraine without aura, not intractable, without status migrainosus: Secondary | ICD-10-CM

## 2020-12-21 MED ORDER — EMGALITY 120 MG/ML ~~LOC~~ SOAJ: 240.0000 mg | Freq: Once | SUBCUTANEOUS | 0 refills | Status: AC

## 2020-12-21 MED ORDER — EMGALITY 120 MG/ML ~~LOC~~ SOAJ: 120.0000 mg | SUBCUTANEOUS | 5 refills | Status: DC

## 2020-12-21 NOTE — Progress Notes (Signed)
[facility] [place] [facility] [place] - [place] Case ID: [id number] Need help? Call us at [phone] Outcome Approvedtoday Your PA request has been approved. Additional information will be provided in the approval communication. (Message 1145) Drug Emgality 120MG /ML auto-injectors (migraine) Form [employer] Electronic PA Form [phone] NCPDP)

## 2020-12-21 NOTE — Patient Instructions (Addendum)
  1. Plan is to start Emgality every 28 days 2. Limit use of pain relievers to no more than 2 days out of the week.  These medications include acetaminophen, NSAIDs (ibuprofen/Advil/Motrin, naproxen/Aleve, triptans (Imitrex/sumatriptan), Excedrin, and narcotics.  This will help reduce risk of rebound headaches. 3. Be aware of common food triggers:  - Caffeine:  coffee, black tea, cola, Mt. Dew  - Chocolate  - Dairy:  aged cheeses (brie, blue, cheddar, gouda, [place], provolone, [place], Swiss, etc), chocolate milk, buttermilk, sour cream, limit eggs and yogurt  - Nuts, peanut butter  - Alcohol  - Cereals/grains:  FRESH breads (fresh bagels, sourdough, doughnuts), yeast productions  - Processed/canned/aged/cured meats (pre-packaged deli meats, hotdogs)  - MSG/glutamate:  soy sauce, flavor enhancer, pickled/preserved/marinated foods  - Sweeteners:  aspartame (Equal, Nutrasweet).  Sugar and Splenda are okay  - Vegetables:  legumes (lima beans, lentils, snow peas, fava beans, pinto peans, peas, garbanzo beans), sauerkraut, onions, olives, pickles  - Fruit:  avocados, bananas, citrus fruit (orange, lemon, grapefruit), mango  - Other:  Frozen meals, macaroni and cheese 4. Routine exercise 5. Stay adequately hydrated (aim for 64 oz water daily) 6. Keep headache diary 7. Maintain proper stress management 8. Maintain proper sleep hygiene 9. Do not skip meals 10. Consider supplements:  magnesium citrate 400mg  daily, riboflavin 400mg  daily, coenzyme Q10 100mg  three times daily. 11.  Follow up 4 to 6 months.

## 2021-01-04 ENCOUNTER — Other Ambulatory Visit: Payer: Self-pay

## 2021-01-04 ENCOUNTER — Encounter (INDEPENDENT_AMBULATORY_CARE_PROVIDER_SITE_OTHER): Payer: Self-pay | Admitting: Otolaryngology

## 2021-01-04 ENCOUNTER — Ambulatory Visit (INDEPENDENT_AMBULATORY_CARE_PROVIDER_SITE_OTHER): Payer: Medicare Other | Admitting: Otolaryngology

## 2021-01-04 VITALS — Temp 97.5°F

## 2021-01-04 DIAGNOSIS — J342 Deviated nasal septum: Secondary | ICD-10-CM | POA: Diagnosis not present

## 2021-01-04 DIAGNOSIS — H9313 Tinnitus, bilateral: Secondary | ICD-10-CM | POA: Diagnosis not present

## 2021-01-04 DIAGNOSIS — J310 Chronic rhinitis: Secondary | ICD-10-CM | POA: Diagnosis not present

## 2021-01-04 DIAGNOSIS — H903 Sensorineural hearing loss, bilateral: Secondary | ICD-10-CM | POA: Diagnosis not present

## 2021-01-04 NOTE — Progress Notes (Signed)
HPI: Brandon [name] is a 70 y.o. male who returns today for evaluation of "sinus" complaints as well as to review hearing with audiologic testing.  The hearing test demonstrated minimal hearing loss with SRT's of 25 dB on the right and 20 dB on the left.  He had type a tympanograms bilaterally. I reviewed the CT scan with the patient in the office today.  This showed clear paranasal sinuses.  He has slight septal deviation to the right with bilateral concha bullosa of the middle turbinate. He is breathing reasonably well today.  He does use nasal CPAP at night..  Past Medical History:  Diagnosis Date  . ED (erectile dysfunction)   . GERD (gastroesophageal reflux disease)   . Hepatitis C    [phone], treated at [facility]  . Hyperlipidemia   . Hypertension   . Nose fracture    Past Surgical History:  Procedure Laterality Date  . FOOT SURGERY Right    1980  . FOOT SURGERY Left    1980  . INGUINAL HERNIA REPAIR Right    2009   Social History   Socioeconomic History  . Marital status: Married    Spouse name: Raevon Broom  . Number of children: 4  . Years of education: 85  . Highest education level: Associate degree: academic program  Occupational History  . Occupation: Investment banker, corporate Rep    Comment: Dept of [employer] in [place] Use  . Smoking status: Former Smoker    Packs/day: 1.50    Years: 15.00    Pack years: 22.50    Types: Cigarettes    Quit date: 10/22/1986    Years since quitting: 34.2  . Smokeless tobacco: Never Used  . Tobacco comment: SmokE off and on  Vaping Use  . Vaping Use: Never used  Substance and Sexual Activity  . Alcohol use: No  . Drug use: No  . Sexual activity: Yes    Partners: Female  Other Topics Concern  . Not on file  Social History Narrative   No caffeine.  Has a 2 years associate degree. Previously worked at [employer] for 12 years. Also work to [employer].   Social Determinants of Health   Financial Resource Strain: Not on file   Food Insecurity: Not on file  Transportation Needs: Not on file  Physical Activity: Not on file  Stress: Not on file  Social Connections: Not on file   Family History  Problem Relation Age of Onset  . Breast cancer Sister   . Depression Sister   . Hyperlipidemia Other   . Ovarian cancer Sister   . Hypertension Other   . Heart disease Brother    Allergies  Allergen Reactions  . Amlodipine Rash  . Atorvastatin Other (See Comments)    Myalgias   Prior to Admission medications   Medication Sig Start Date End Date Taking? Authorizing Provider  albuterol (PROAIR HFA) 108 (90 Base) MCG/ACT inhaler Inhale 2 puffs into the lungs every 6 (six) hours as needed for wheezing or shortness of breath. 01/30/18   Hali Marry, MD  Blood Glucose Monitoring Suppl (ONE TOUCH ULTRA 2) w/Device KIT DX DM E11.9 Check fasting blood sugar 2-3 times weekly. Patient not taking: Reported on 12/21/2020 03/03/20   Hali Marry, MD  cetirizine (ZYRTEC) 10 MG tablet TAKE 1 TABLET BY MOUTH EVERY DAY 11/09/19   Emeterio Reeve, DO  Cholecalciferol (VITAMIN D3 PO) Take 1 tablet by mouth daily.    [provider]  CINNAMON PO Take 2 capsules by mouth daily.    [provider]  clindamycin (CLEOCIN T) 1 % lotion Apply topically 2 (two) times daily.    [provider]  clindamycin (CLEOCIN T) 1 % SWAB  04/17/16   [provider]  clotrimazole-betamethasone (LOTRISONE) cream Apply topically 2 (two) times daily. Patient not taking: Reported on 12/21/2020 05/16/17   Hali Marry, MD  cyclobenzaprine (FLEXERIL) 10 MG tablet  08/01/19   [provider]  diclofenac sodium (VOLTAREN) 1 % GEL Apply 2 g topically 4 (four) times daily. To affected areas 01/30/18   Hali Marry, MD  fluticasone [facility]) 50 MCG/ACT nasal spray Place 2 sprays into both nostrils daily. 04/19/16   Gregor Hams, MD  Galcanezumab-gnlm [facility]) 120 MG/ML SOAJ Inject 120 mg  into the skin every 28 (twenty-eight) days. 12/21/20   Jaffe, Adam R, DO  glucose blood (ONETOUCH ULTRA) test strip CHECK FASTING BLOOD SUGAR 2-3 TIMES WEEKLY. Patient not taking: Reported on 12/21/2020 03/28/20   Hali Marry, MD  hydrochlorothiazide (HYDRODIURIL) 25 MG tablet Take 1 tablet (25 mg total) by mouth daily. 10/04/20   Hali Marry, MD  hydrocortisone (ANUSOL-HC) 25 MG suppository Place 25 mg rectally as needed. Reported on 04/19/2016    [provider]  hydrocortisone 2.5 % cream Apply topically as needed. Reported on 04/19/2016    [provider]  ipratropium (ATROVENT) 0.06 % nasal spray Place 2 sprays into both nostrils every 4 (four) hours as needed for rhinitis. 04/19/16   Gregor Hams, MD  Iron-Folic Acid-Vit N82 (IRON FORMULA PO) Take by mouth daily. In liquid form    [provider]  Lancets 30G MISC DX DM E11.9 Check fasting blood sugar 2-3 times weekly. Patient not taking: Reported on 12/21/2020 03/03/20   Hali Marry, MD  losartan (COZAAR) 100 MG tablet Take 1 tablet (100 mg total) by mouth daily. 12/02/20   Hali Marry, MD  montelukast (SINGULAIR) 10 MG tablet TAKE 1 TABLET BY MOUTH AT BEDTIME. 05/12/18   Hali Marry, MD  Multiple Vitamin (MULTIVITAMIN) tablet Take 1 tablet by mouth daily.    [provider]  Naftifine HCl 2 % CREA APPLY 1 APPLICATION TOPICALLY 2 TIMES A DAY. Patient not taking: Reported on 12/21/2020 04/25/20   Hali Marry, MD  Omega-3 Fatty Acids (FISH OIL) 1000 MG CAPS Take by mouth.    [provider]  pantoprazole (PROTONIX) 40 MG tablet Take 40 mg by mouth 2 (two) times daily. 10/17/20   [provider]  rosuvastatin (CRESTOR) 10 MG tablet TAKE 1 TABLET BY MOUTH EVERYDAY AT BEDTIME 04/27/20   Hali Marry, MD  sertraline (ZOLOFT) 100 MG tablet Take 1 tablet by mouth daily. 11/13/18   [provider]  tadalafil (CIALIS) 5 MG tablet Take 5 mg  by mouth daily. 11/11/17   [provider]  tamsulosin (FLOMAX) 0.4 MG CAPS capsule Take 0.4 mg by mouth.    [provider]  terbinafine (LAMISIL) 1 % cream Apply 1 application topically 2 (two) times daily.    [provider]  tiotropium (SPIRIVA) 18 MCG inhalation capsule Place 18 mcg into inhaler and inhale daily.    [provider]  topiramate (TOPAMAX) 50 MG tablet TAKE 1 TABLET BY MOUTH TWICE A DAY 10/28/20   Breeback, Jade L, PA-C  TRAZODONE HCL PO Take 100 mg by mouth daily.    [provider]  triamcinolone ointment (KENALOG) 0.1 %  08/31/19   [provider]  valACYclovir (VALTREX) 500 MG tablet TAKE 1 TABLET BY MOUTH EVERY DAY 08/22/20   Hali Marry, MD     Positive ROS: Otherwise negative  All other systems have been reviewed and were otherwise negative with the exception of those mentioned in the HPI and as above.  Physical Exam: Constitutional: Alert, well-appearing, no acute distress Ears: External ears without lesions or tenderness. Ear canals are clear bilaterally with intact, clear TMs.  Nasal: External nose without lesions. Septum slightly deviated to the right.  Both middle meatus regions were clear.  No polyps noted.. Oral: Lips and gums without lesions. Tongue and palate mucosa without lesions. Posterior oropharynx clear. Neck: No palpable adenopathy or masses Respiratory: Breathing comfortably  Skin: No facial/neck lesions or rash noted.  Procedures  Assessment: Mild hearing loss. Chronic rhinitis.  Plan: Would recommend regular use of nasal steroid spray to help with the nasal congestion as well as saline rinse.  If he continues to have nasal obstruction could consider surgical intervention which would involve septoplasty and turbinate reductions as well as reduction of concha bullosa.  However he is doing reasonably well at this point on the Flonase and saline rinse. He will follow-up as  needed.   Radene Journey, MD

## 2021-01-13 DIAGNOSIS — R3911 Hesitancy of micturition: Secondary | ICD-10-CM | POA: Diagnosis not present

## 2021-01-13 DIAGNOSIS — R300 Dysuria: Secondary | ICD-10-CM | POA: Diagnosis not present

## 2021-01-20 DIAGNOSIS — R300 Dysuria: Secondary | ICD-10-CM | POA: Diagnosis not present

## 2021-01-20 DIAGNOSIS — R3911 Hesitancy of micturition: Secondary | ICD-10-CM | POA: Diagnosis not present

## 2021-01-22 ENCOUNTER — Other Ambulatory Visit: Payer: Self-pay | Admitting: Physician Assistant

## 2021-01-22 DIAGNOSIS — G43701 Chronic migraine without aura, not intractable, with status migrainosus: Secondary | ICD-10-CM

## 2021-02-01 DIAGNOSIS — Z23 Encounter for immunization: Secondary | ICD-10-CM | POA: Diagnosis not present

## 2021-02-14 ENCOUNTER — Telehealth: Payer: Self-pay | Admitting: Neurology

## 2021-02-14 ENCOUNTER — Other Ambulatory Visit: Payer: Self-pay

## 2021-02-14 MED ORDER — PROPRANOLOL HCL ER 60 MG PO CP24: 60.0000 mg | ORAL_CAPSULE | Freq: Every day | ORAL | 3 refills | Status: DC

## 2021-02-14 NOTE — Telephone Encounter (Signed)
Patient called in stating he would like to see if Dr. Tomi [name] could prescribe something different for him than the Emgality. He stated he would prefer it to be in pill form.

## 2021-02-14 NOTE — Telephone Encounter (Signed)
Please advise 

## 2021-02-14 NOTE — Telephone Encounter (Signed)
We can start propranolol ER 60mg  daily.  It is a blood pressure medication but different than the ones he already takes.  This one is helpful in reducing migraines.  Just has to be cautious for lightheadedness, in which case he needs to contact us and we can stop it.  If headaches not improved in 6 weeks, he should contact us and we can increase dose if needed/tolerated.  He may discontinue Emgality.

## 2021-02-14 NOTE — Telephone Encounter (Signed)
Rx printed off for Dr.Jaffe to sign.

## 2021-03-03 ENCOUNTER — Other Ambulatory Visit: Payer: Self-pay | Admitting: Family Medicine

## 2021-03-03 DIAGNOSIS — A6000 Herpesviral infection of urogenital system, unspecified: Secondary | ICD-10-CM

## 2021-03-08 ENCOUNTER — Other Ambulatory Visit: Payer: Self-pay | Admitting: Family Medicine

## 2021-03-08 DIAGNOSIS — I10 Essential (primary) hypertension: Secondary | ICD-10-CM

## 2021-04-07 DIAGNOSIS — N5201 Erectile dysfunction due to arterial insufficiency: Secondary | ICD-10-CM | POA: Diagnosis not present

## 2021-04-07 DIAGNOSIS — N138 Other obstructive and reflux uropathy: Secondary | ICD-10-CM | POA: Diagnosis not present

## 2021-04-07 DIAGNOSIS — N401 Enlarged prostate with lower urinary tract symptoms: Secondary | ICD-10-CM | POA: Diagnosis not present

## 2021-04-07 DIAGNOSIS — R300 Dysuria: Secondary | ICD-10-CM | POA: Diagnosis not present

## 2021-04-19 ENCOUNTER — Other Ambulatory Visit: Payer: Self-pay | Admitting: Family Medicine

## 2021-04-19 DIAGNOSIS — G43701 Chronic migraine without aura, not intractable, with status migrainosus: Secondary | ICD-10-CM

## 2021-05-18 ENCOUNTER — Other Ambulatory Visit: Payer: Self-pay

## 2021-05-18 MED ORDER — NAFTIFINE HCL 2 % EX CREA: TOPICAL_CREAM | CUTANEOUS | 11 refills | Status: DC

## 2021-05-18 NOTE — Telephone Encounter (Signed)
I am unsure of appropriateness to refill this RX.  Please review and refill if appropriate.  Charyl Bigger, CMA

## 2021-06-13 ENCOUNTER — Other Ambulatory Visit: Payer: Self-pay | Admitting: Family Medicine

## 2021-06-16 MED ORDER — NAFTIFINE HCL 2 % EX CREA: TOPICAL_CREAM | CUTANEOUS | 11 refills | Status: DC

## 2021-06-16 NOTE — Addendum Note (Signed)
Addended by: Narda Rutherford on: 06/16/2021 08:08 AM   Modules accepted: Orders

## 2021-06-27 NOTE — Progress Notes (Signed)
NEUROLOGY FOLLOW UP OFFICE NOTE  Brandon [name] [id number]  Assessment/Plan:   Migraine without aura, without status migrainosus, not intractable  Migraine prevention:  Propranolol ER 70m daily.  Continue topiramate 58mtwice daily. Migraine rescue:  Tylenol Limit use of pain relievers to no more than 2 days out of week to prevent risk of rebound or medication-overuse headache. Keep headache diary Follow up 6 months   Subjective:  Brandon [name] a [age]ear old right-handed male with HTN, HLD and hepatitis C who follows up for migraines.  UPDATE: Considered Emgality but he would rather try something different.  Started propranolol in April. Intensity:  moderate-severe Duration:  2 hours Frequency:  3 since April Frequency of abortive medication: 3 days since April.  Current NSAIDS/analgesics:  Tylenol 50070murrent triptans:  none Current ergotamine:  none Current anti-emetic:  none Current muscle relaxants:  Flexeril 56m40mrrent Antihypertensive medications:  Propranolol ER 60mg32mly.  HCTZ, losartan Current Antidepressant medications: sertraline 100mg 26ment Anticonvulsant medications:  topiramate 50mg t65m daily Current anti-CGRP:  none Current Vitamins/Herbal/Supplements:  MVI Current Antihistamines/Decongestants:  Flonase, Zyrtec, Singulair Other therapy:  none Hormone/birth control:  none  Caffeine:  No coffee/soda Diet:  Hydrates with water.  Does not skip meals  Exercise:  walks Depression:  yes; Anxiety:  yes Other pain:  Back pain Sleep hygiene:  varies  HISTORY:  Onset:  Started after fracturing his nose in football in young adulthood.  Started having sinus issues and headaches.  Hasd deviated septum.  History of recurrent sinusitis. Location:  Bifrontal and top of head, face Quality:  pounding Initial Intensity:   Severe.   He denies new headache, thunderclap headache or severe headache that wakes him from sleep. Aura:  no Associated  symptoms:  Nausea, photophobia, phonophobia, mild dizziness, mild blurred vision..  He dMarland Kitchennies associated unilateral numbness or weakness. Initial Duration:  Several hours.  He has had headaches that lasted several days. Initial Frequency:  6-7 days in February Initial Frequency of abortive medication: at least 3 days a week Triggers:  Seasonal allergies and sinus infections Relieving factors:  rest Activity:  aggravates     Past NSAIDS/analgesics:  Tramadol, ibuprofen 800mg Pa47mbortive triptans:  none Past abortive ergotamine:  none Past muscle relaxants:  baclofen Past anti-emetic:  none Past antihypertensive medications:  Verapamil, amlodipine Past antidepressant medications:  Cybalta Past anticonvulsant medications:  none Past anti-CGRP:  none Past vitamins/Herbal/Supplements:  none Past antihistamines/decongestants:  none Other past therapies:  none    Family history of headache:  no  PAST MEDICAL HISTORY: Past Medical History:  Diagnosis Date   ED (erectile dysfunction)    GERD (gastroesophageal reflux disease)    Hepatitis C    [phone]    Hypertension    Nose fracture     MEDICATIONS: Current Outpatient Medications on File Prior to Visit  Medication Sig Dispense Refill   albuterol (PROAIR HFA) 108 (90 Base) MCG/ACT inhaler Inhale 2 puffs into the lungs every 6 (six) hours as needed for wheezing or shortness of breath. 18 g 4   Blood Glucose Monitoring Suppl (ONE TOUCH ULTRA 2) w/Device KIT DX DM E11.9 Check fasting blood sugar 2-3 times weekly. (Patient not taking: Reported on 12/21/2020) 1 kit prn   cetirizine (ZYRTEC) 10 MG tablet TAKE 1 TABLET BY MOUTH EVERY DAY 90 tablet 3   Cholecalciferol (VITAMIN D3 PO) Take 1 tablet by mouth daily.     CINNAMON PO Take  2 capsules by mouth daily.     clindamycin (CLEOCIN T) 1 % lotion Apply topically 2 (two) times daily.     clindamycin (CLEOCIN T) 1 % SWAB       clotrimazole-betamethasone (LOTRISONE) cream Apply topically 2 (two) times daily. (Patient not taking: Reported on 12/21/2020) 45 g 3   cyclobenzaprine (FLEXERIL) 10 MG tablet      diclofenac sodium (VOLTAREN) 1 % GEL Apply 2 g topically 4 (four) times daily. To affected areas 400 g PRN   fluticasone (FLONASE) 50 MCG/ACT nasal spray Place 2 sprays into both nostrils daily. 16 g 2   Galcanezumab-gnlm (EMGALITY) 120 MG/ML SOAJ Inject 120 mg into the skin every 28 (twenty-eight) days. 1.12 mL 5   glucose blood (ONETOUCH ULTRA) test strip CHECK FASTING BLOOD SUGAR 2-3 TIMES WEEKLY. (Patient not taking: Reported on 12/21/2020) 50 strip 25   hydrochlorothiazide (HYDRODIURIL) 25 MG tablet Take 1 tablet  by mouth daily. 90 tablet 1   hydrocortisone (ANUSOL-HC) 25 MG suppository Place 25 mg rectally as needed. Reported on 04/19/2016     hydrocortisone 2.5 % cream Apply topically as needed. Reported on 04/19/2016     ipratropium (ATROVENT) 0.06 % nasal spray Place 2 sprays into both nostrils every 4 (four) hours as needed for rhinitis. 10 mL 6   Iron-Folic Acid-Vit I50 (IRON FORMULA PO) Take by mouth daily. In liquid form     Lancets 30G MISC DX DM E11.9 Check fasting blood sugar 2-3 times weekly. (Patient not taking: Reported on 12/21/2020) 100 each prn   losartan (COZAAR) 100 MG tablet Take 1 tablet (100 mg total) by mouth daily. 90 tablet 1   montelukast (SINGULAIR) 10 MG tablet TAKE 1 TABLET BY MOUTH AT BEDTIME. 30 tablet 5   Multiple Vitamin (MULTIVITAMIN) tablet Take 1 tablet by mouth daily.     Naftifine HCl 2 % CREA Apply 1 application topically 2 times a day 60 g 11   Omega-3 Fatty Acids (FISH OIL) 1000 MG CAPS Take by mouth.     pantoprazole (PROTONIX) 40 MG tablet Take 40 mg by mouth 2 (two) times daily.     propranolol ER (INDERAL LA) 60 MG 24 hr capsule Take 1 capsule (60 mg total) by mouth daily. 30 capsule 3   rosuvastatin (CRESTOR) 10 MG tablet TAKE 1 TABLET BY MOUTH EVERYDAY AT BEDTIME 90 tablet 3    sertraline (ZOLOFT) 100 MG tablet Take 1 tablet by mouth daily.     tadalafil (CIALIS) 5 MG tablet Take 5 mg by mouth daily.  11   tamsulosin (FLOMAX) 0.4 MG CAPS capsule Take 0.4 mg by mouth.     tiotropium (SPIRIVA) 18 MCG inhalation capsule Place 18 mcg into inhaler and inhale daily.     topiramate (TOPAMAX) 50 MG tablet TAKE 1 TABLET BY MOUTH TWICE A DAY 180 tablet 0   TRAZODONE HCL PO Take 100 mg by mouth daily.     triamcinolone ointment (KENALOG) 0.1 %      valACYclovir (VALTREX) 500 MG tablet TAKE 1 TABLET BY MOUTH EVERY DAY 90 tablet 1   No current facility-administered medications on file prior to visit.    ALLERGIES: Allergies  Allergen Reactions   Amlodipine Rash   Atorvastatin Other (See Comments)    Myalgias    FAMILY HISTORY: Family History  Problem Relation Age of Onset   Breast cancer Sister    Depression Sister    Hyperlipidemia Other    Ovarian cancer Sister    Hypertension  Other    Heart disease Brother       Objective:  Blood pressure 136/80, pulse 66, height 5' 7"  (1.702 m), weight 197 lb (89.4 kg), SpO2 97 %. General: No acute distress.  Patient appears well-groomed.    Metta Clines, DO  CC: Beatrice Lecher, MD

## 2021-06-28 ENCOUNTER — Ambulatory Visit (INDEPENDENT_AMBULATORY_CARE_PROVIDER_SITE_OTHER): Payer: Medicare Other | Admitting: Neurology

## 2021-06-28 ENCOUNTER — Encounter: Payer: Self-pay | Admitting: Neurology

## 2021-06-28 ENCOUNTER — Ambulatory Visit: Payer: Medicare Other | Admitting: Family Medicine

## 2021-06-28 ENCOUNTER — Other Ambulatory Visit: Payer: Self-pay

## 2021-06-28 VITALS — BP 136/80 | HR 66 | Ht 67.0 in | Wt 197.0 lb

## 2021-06-28 DIAGNOSIS — G43009 Migraine without aura, not intractable, without status migrainosus: Secondary | ICD-10-CM

## 2021-06-28 NOTE — Patient Instructions (Signed)
Continue propranolol and topiramate Limit use of pain relievers to no more than 2 days out of week to prevent risk of rebound or medication-overuse headache. Follow up 6 months

## 2021-06-29 ENCOUNTER — Telehealth: Payer: Self-pay | Admitting: Lab

## 2021-06-29 NOTE — Progress Notes (Signed)
  Chronic Care Management   Outreach Note  06/29/2021 Name: Brandon [name] MRN: [id number] DOB: June 09, 1951  Referred by: Hali Marry, MD Reason for referral : Medication Management   An unsuccessful telephone outreach was attempted today. The patient was referred to the pharmacist for assistance with care management and care coordination.   Follow Up Plan:   [place]

## 2021-07-04 ENCOUNTER — Encounter: Payer: Self-pay | Admitting: Family Medicine

## 2021-07-04 ENCOUNTER — Ambulatory Visit (INDEPENDENT_AMBULATORY_CARE_PROVIDER_SITE_OTHER): Payer: Medicare Other | Admitting: Family Medicine

## 2021-07-04 ENCOUNTER — Other Ambulatory Visit: Payer: Self-pay | Admitting: Family Medicine

## 2021-07-04 VITALS — BP 136/64 | HR 87 | Ht 67.0 in | Wt 197.0 lb

## 2021-07-04 DIAGNOSIS — N401 Enlarged prostate with lower urinary tract symptoms: Secondary | ICD-10-CM

## 2021-07-04 DIAGNOSIS — J019 Acute sinusitis, unspecified: Secondary | ICD-10-CM | POA: Diagnosis not present

## 2021-07-04 DIAGNOSIS — Z0000 Encounter for general adult medical examination without abnormal findings: Secondary | ICD-10-CM | POA: Diagnosis not present

## 2021-07-04 DIAGNOSIS — I10 Essential (primary) hypertension: Secondary | ICD-10-CM | POA: Diagnosis not present

## 2021-07-04 DIAGNOSIS — R3911 Hesitancy of micturition: Secondary | ICD-10-CM | POA: Diagnosis not present

## 2021-07-04 DIAGNOSIS — E785 Hyperlipidemia, unspecified: Secondary | ICD-10-CM

## 2021-07-04 MED ORDER — AMOXICILLIN-POT CLAVULANATE 875-125 MG PO TABS: 1.0000 | ORAL_TABLET | Freq: Two times a day (BID) | ORAL | 0 refills | Status: DC

## 2021-07-04 NOTE — Patient Instructions (Signed)
  Brandon [name] , Thank you for taking time to come for your Medicare Wellness Visit. I appreciate your ongoing commitment to your health goals. Please review the following plan we discussed and let me know if I can assist you in the future.   These are the goals we discussed Continue to work on weight loss.  I would love to see you lose about 5 pounds over the next year.   This is a list of the screening recommended for you and due dates:  Health Maintenance  Topic Date Due   Complete foot exam   08/03/2020   Hemoglobin A1C  08/03/2020   Colon Cancer Screening  08/04/2020   Eye exam for diabetics  11/29/2020   Flu Shot  05/22/2021   Tetanus Vaccine  11/18/2024   COVID-19 Vaccine  Completed   Hepatitis C Screening: USPSTF Recommendation to screen - Ages 18-79 yo.  Completed   Pneumonia vaccines  Completed   Zoster (Shingles) Vaccine  Completed   HPV Vaccine  Aged Out

## 2021-07-04 NOTE — Progress Notes (Signed)
Subjective:   Brandon [name] is a 70 y.o. male who presents for Medicare Annual/Subsequent preventive examination.  He is doing well overall.  He did want a give me an update he is likely going to have surgery on his left foot with podiatry through the [facility] at some point this year.  He also had COVID about 4 weeks ago and says since then he is continued to have some thick nasal congestion and drainage to the point that is causing coughing and choking at night.  He says he has had a lot of facial pressure particularly across the nasal bridge and into his anterior forehead.  Also had a little bit of drainage from his eyes as well.  He is also had a bad taste from the drainage and its been upsetting his stomach.  Has been having some issues with his eyes and recently had laser surgery in fact he has a follow-up tomorrow.  He has completed his Shingrix vaccine series and he recently received a flu vaccine.  Review of Systems    ROS is negative.         Objective:    Today's Vitals   07/04/21 1404  BP: 136/64  Pulse: 87  SpO2: 97%  Weight: 197 lb (89.4 kg)  Height: _0  (1.702 m)   Body mass index is 30.85 kg/m.  Advanced Directives 12/21/2020 12/28/2019 04/16/2019 12/23/2018  Does Patient Have a Medical Advance Directive? No No No No  Would patient like information on creating a medical advance directive? - No - Patient declined No - Patient declined Yes (MAU/Ambulatory/Procedural Areas - Information given)    Current Medications (verified) Outpatient Encounter Medications as of 07/04/2021  Medication Sig   albuterol (PROAIR HFA) 108 (90 Base) MCG/ACT inhaler Inhale 2 puffs into the lungs every 6 (six) hours as needed for wheezing or shortness of breath.   amoxicillin-clavulanate (AUGMENTIN) 875-125 MG tablet Take 1 tablet by mouth 2 (two) times daily.   Blood Glucose Monitoring Suppl (ONE TOUCH ULTRA 2) w/Device KIT DX DM E11.9 Check fasting blood sugar 2-3 times weekly.    cetirizine (ZYRTEC) 10 MG tablet TAKE 1 TABLET BY MOUTH EVERY DAY   Cholecalciferol (VITAMIN D3 PO) Take 1 tablet by mouth daily.   CINNAMON PO Take 2 capsules by mouth daily.   clindamycin (CLEOCIN T) 1 % lotion Apply topically 2 (two) times daily.   clindamycin (CLEOCIN T) 1 % SWAB    clotrimazole-betamethasone (LOTRISONE) cream Apply topically 2 (two) times daily.   cyclobenzaprine (FLEXERIL) 10 MG tablet    diclofenac sodium (VOLTAREN) 1 % GEL Apply 2 g topically 4 (four) times daily. To affected areas   fluticasone (FLONASE) 50 MCG/ACT nasal spray Place 2 sprays into both nostrils daily.   glucose blood (ONETOUCH ULTRA) test strip CHECK FASTING BLOOD SUGAR 2-3 TIMES WEEKLY.   hydrochlorothiazide (HYDRODIURIL) 25 MG tablet Take 1 tablet  by mouth daily.   hydrocortisone (ANUSOL-HC) 25 MG suppository Place 25 mg rectally as needed. Reported on 04/19/2016   hydrocortisone 2.5 % cream Apply topically as needed. Reported on 04/19/2016   ipratropium (ATROVENT) 0.06 % nasal spray Place 2 sprays into both nostrils every 4 (four) hours as needed for rhinitis.   Iron-Folic Acid-Vit H63 (IRON FORMULA PO) Take by mouth daily. In liquid form   Lancets 30G MISC DX DM E11.9 Check fasting blood sugar 2-3 times weekly.   losartan (COZAAR) 100 MG tablet Take 1 tablet (100 mg total) by mouth daily.  montelukast (SINGULAIR) 10 MG tablet TAKE 1 TABLET BY MOUTH AT BEDTIME.   Multiple Vitamin (MULTIVITAMIN) tablet Take 1 tablet by mouth daily.   Naftifine HCl 2 % CREA Apply 1 application topically 2 times a day   Omega-3 Fatty Acids (FISH OIL) 1000 MG CAPS Take by mouth.   pantoprazole (PROTONIX) 40 MG tablet Take 40 mg by mouth 2 (two) times daily.   propranolol ER (INDERAL LA) 60 MG 24 hr capsule Take 1 capsule (60 mg total) by mouth daily.   rosuvastatin (CRESTOR) 10 MG tablet TAKE 1 TABLET BY MOUTH EVERYDAY AT BEDTIME   sertraline (ZOLOFT) 100 MG tablet Take 1 tablet by mouth daily.   tadalafil (CIALIS) 5  MG tablet Take 5 mg by mouth daily.   tamsulosin (FLOMAX) 0.4 MG CAPS capsule Take 0.4 mg by mouth.   tiotropium (SPIRIVA) 18 MCG inhalation capsule Place 18 mcg into inhaler and inhale daily.   topiramate (TOPAMAX) 50 MG tablet TAKE 1 TABLET BY MOUTH TWICE A DAY   TRAZODONE HCL PO Take 100 mg by mouth daily.   triamcinolone ointment (KENALOG) 0.1 %    valACYclovir (VALTREX) 500 MG tablet TAKE 1 TABLET BY MOUTH EVERY DAY   [DISCONTINUED] Galcanezumab-gnlm (EMGALITY) 120 MG/ML SOAJ Inject 120 mg into the skin every 28 (twenty-eight) days. (Patient not taking: Reported on 06/28/2021)   No facility-administered encounter medications on file as of 07/04/2021.    Allergies (verified) Amlodipine and Atorvastatin   History: Past Medical History:  Diagnosis Date   ED (erectile dysfunction)    GERD (gastroesophageal reflux disease)    Hepatitis C    [phone], treated at [facility]   Hyperlipidemia    Hypertension    Nose fracture    Past Surgical History:  Procedure Laterality Date   FOOT SURGERY Right    1980   FOOT SURGERY Left    1980   INGUINAL HERNIA REPAIR Right    2009   Family History  Problem Relation Age of Onset   Breast cancer Sister    Depression Sister    Hyperlipidemia Other    Ovarian cancer Sister    Hypertension Other    Heart disease Brother    Social History   Socioeconomic History   Marital status: Married    Spouse name: Bailey Kolbe   Number of children: 4   Years of education: 14   Highest education level: Associate degree: academic program  Occupational History   Occupation: [place]: Dept of [employer] in [employer]  Tobacco Use   Smoking status: Former    Packs/day: 1.50    Years: 15.00    Pack years: 22.50    Types: Cigarettes    Quit date: 10/22/1986    Years since quitting: 34.7   Smokeless tobacco: Never   Tobacco comments:    SmokE off and on  Vaping Use   Vaping Use: Never used  Substance and Sexual Activity    Alcohol use: No   Drug use: No   Sexual activity: Yes    Partners: Female  Other Topics Concern   Not on file  Social History Narrative   No caffeine.  Has a 2 years associate degree. Previously worked at [employer] for 12 years. Also work to [employer].   Social Determinants of Health   Financial Resource Strain: Not on file  Food Insecurity: Not on file  Transportation Needs: Not on file  Physical Activity: Not on file  Stress: Not on  file  Social Connections: Not on file    Tobacco Counseling Counseling given: Not Answered Tobacco comments: SmokE off and on   Clinical Intake:                 Diabetic?No          Activities of Daily Living No flowsheet data found.  Patient Care Team: Hali Marry, MD as PCP - General (Family Medicine) Hali Marry, MD as Consulting Physician (Family Medicine)  Indicate any recent Medical Services you may have received from other than [facility] providers in the past year (date may be approximate).     Assessment:   This is a routine wellness examination for Masen.  Hearing/Vision screen No results found.  Dietary issues and exercise activities discussed:     Goals Addressed   None    Depression Screen PHQ 2/9 Scores 12/28/2019 07/14/2019 01/02/2019 12/23/2018 01/30/2018 08/02/2017 01/31/2017  PHQ - 2 Score _0 PHQ- 9 Score - 16 15 - _1 Fall Risk Fall Risk  12/21/2020 12/28/2019 08/04/2019 07/14/2019 12/23/2018  Falls in the past year? 0 0 0 0 0  Comment - - - - -  Number falls in past yr: 0 - 0 0 -  Injury with Fall? 0 - 0 0 -  Risk for fall due to : - No Fall Risks - - -  Follow up - Falls prevention discussed - - Falls prevention discussed    FALL RISK PREVENTION PERTAINING TO THE HOME:  Any stairs in or around the home? Yes  If so, are there any without handrails? No  Home free of loose throw rugs in walkways, pet beds, electrical cords, etc? Yes  Adequate lighting in your home to reduce  risk of falls? Yes   ASSISTIVE DEVICES UTILIZED TO PREVENT FALLS:  Life alert? No  Use of a cane, walker or w/c? No  Grab bars in the bathroom? No  Shower chair or bench in shower? No  Elevated toilet seat or a handicapped toilet? No   TIMED UP AND GO:  Was the test performed? No .   Gait steady and fast without use of assistive device  Cognitive Function:     6CIT Screen 12/28/2019 12/23/2018  What Year? 0 points 0 points  What month? 0 points 0 points  What time? 0 points 0 points  Count back from 20 0 points 0 points  Months in reverse 0 points 0 points  Repeat phrase 4 points 2 points  Total Score 4 2    Immunizations Immunization History  Administered Date(s) Administered   Influenza Split 09/03/2018   Influenza, High Dose Seasonal PF 07/22/2020   Influenza,inj,Quad PF,6+ Mos 07/14/2014, 08/23/2015   Influenza-Unspecified 10/01/2011, 08/19/2012, 08/03/2013, 08/15/2016, 08/08/2017, 08/22/2018, 08/06/2019   PFIZER Comirnaty(Gray Top)Covid-19 Tri-Sucrose Vaccine 02/01/2021   PFIZER(Purple Top)SARS-COV-2 Vaccination 12/11/2019, 01/01/2020, 09/06/2020   Pneumococcal Conjugate-13 10/01/2016   Pneumococcal Polysaccharide-23 01/15/2012, 08/25/2015   Td (Adult) 12/12/2018   Tdap 10/22/2006, 11/18/2014   Zoster Recombinat (Shingrix) 04/18/2021, 06/22/2021   Zoster, Live 11/18/2014    TDAP status: Up to date  Flu Vaccine status: Up to date  Pneumococcal vaccine status: Up to date  Covid-19 vaccine status: Completed vaccines  Qualifies for Shingles Vaccine? No   Zostavax completed Yes   Shingrix Completed?: Yes  Screening Tests Health Maintenance  Topic Date Due   COLONOSCOPY (Pts 45-75yr Insurance coverage will need to be confirmed)  08/04/2020  INFLUENZA VACCINE  01/19/2022 (Originally 05/22/2021)   TETANUS/TDAP  11/18/2024   COVID-19 Vaccine  Completed   Hepatitis C Screening  Completed   PNA vac Low Risk Adult  Completed   Zoster Vaccines- Shingrix   Completed   HPV VACCINES  Aged Out    Health Maintenance  Health Maintenance Due  Topic Date Due   COLONOSCOPY (Pts 45-34yr Insurance coverage will need to be confirmed)  08/04/2020    Colorectal cancer screening: Type of screening: Colonoscopy. Completed 2018. Repeat every 3 years  Lung Cancer Screening: (Low Dose CT Chest recommended if Age 71-80years, 30 pack-year currently smoking OR have quit w/in 15years.) does not qualify.   Lung Cancer Screening Referral: NA  Additional Screening:  Hepatitis C Screening: does not qualify; Completed - Yes  Vision Screening: Recommended annual ophthalmology exams for early detection of glaucoma and other disorders of the eye. Is the patient up to date with their annual eye exam?  Yes  Who is the provider or what is the name of the office in which the patient attends [facility] exams? [facility] If pt is not established with a provider, would they like to be referred to a provider to establish care?  NA .   Dental Screening: Recommended annual dental exams for proper oral hygiene  Community Resource Referral / Chronic Care Management: CRR required this visit?  No   CCM required this visit?  No      Plan:     I have personally reviewed and noted the following in the patient's chart:   Medical and social history Use of alcohol, tobacco or illicit drugs  Current medications and supplements including opioid prescriptions. Patient is not currently taking opioid prescriptions. Functional ability and status Nutritional status Physical activity Advanced directives List of other physicians Hospitalizations, surgeries, and ER visits in previous 12 months Vitals Screenings to include cognitive, depression, and falls Referrals and appointments Acute sinusitis - will tx with Augmentin. Call if not better in one week.  He is already received his flu vaccine and [place]  In addition, I have reviewed and discussed with patient  certain preventive protocols, quality metrics, and best practice recommendations. A written personalized care plan for preventive services as well as general preventive health recommendations were provided to patient.     CBeatrice Lecher MD   07/04/2021

## 2021-07-08 ENCOUNTER — Other Ambulatory Visit: Payer: Self-pay | Admitting: Family Medicine

## 2021-07-11 ENCOUNTER — Telehealth: Payer: Self-pay | Admitting: Lab

## 2021-07-11 DIAGNOSIS — N401 Enlarged prostate with lower urinary tract symptoms: Secondary | ICD-10-CM | POA: Diagnosis not present

## 2021-07-11 DIAGNOSIS — I10 Essential (primary) hypertension: Secondary | ICD-10-CM | POA: Diagnosis not present

## 2021-07-11 DIAGNOSIS — R3911 Hesitancy of micturition: Secondary | ICD-10-CM | POA: Diagnosis not present

## 2021-07-11 DIAGNOSIS — E785 Hyperlipidemia, unspecified: Secondary | ICD-10-CM | POA: Diagnosis not present

## 2021-07-11 LAB — COMPLETE METABOLIC PANEL WITH GFR
AG Ratio: 1.7 (calc) (ref 1.0–2.5)
ALT: 27 U/L (ref 9–46)
AST: 22 U/L (ref 10–35)
Albumin: 4.3 g/dL (ref 3.6–5.1)
Alkaline phosphatase (APISO): 80 U/L (ref 35–144)
BUN: 13 mg/dL (ref 7–25)
CO2: 27 mmol/L (ref 20–32)
Calcium: 9.6 mg/dL (ref 8.6–10.3)
Chloride: 101 mmol/L (ref 98–110)
Creat: 1.12 mg/dL (ref 0.70–1.28)
Globulin: 2.6 g/dL (calc) (ref 1.9–3.7)
Glucose, Bld: 108 mg/dL — ABNORMAL HIGH (ref 65–99)
Potassium: 4.3 mmol/L (ref 3.5–5.3)
Sodium: 140 mmol/L (ref 135–146)
Total Bilirubin: 0.6 mg/dL (ref 0.2–1.2)
Total Protein: 6.9 g/dL (ref 6.1–8.1)
eGFR: 70 mL/min/{1.73_m2} (ref >=60)

## 2021-07-11 LAB — CBC
HCT: 48.8 % (ref 38.5–50.0)
Hemoglobin: 16.1 g/dL (ref 13.2–17.1)
MCH: 26.3 pg — ABNORMAL LOW (ref 27.0–33.0)
MCHC: 33 g/dL (ref 32.0–36.0)
MCV: 79.7 fL — ABNORMAL LOW (ref 80.0–100.0)
Platelets: 150 10*3/uL (ref 140–400)
RBC: 6.12 10*6/uL — ABNORMAL HIGH (ref 4.20–5.80)
RDW: 16.2 % — ABNORMAL HIGH (ref 11.0–15.0)
WBC: 6.1 10*3/uL (ref 3.8–10.8)

## 2021-07-11 LAB — LIPID PANEL
Cholesterol: 187 mg/dL (ref <200)
HDL: 32 mg/dL — ABNORMAL LOW (ref >=40)
LDL Cholesterol (Calc): 118 mg/dL (calc) — ABNORMAL HIGH
Non-HDL Cholesterol (Calc): 155 mg/dL (calc) — ABNORMAL HIGH (ref <130)
Total CHOL/HDL Ratio: 5.8 (calc) — ABNORMAL HIGH (ref <5.0)
Triglycerides: 260 mg/dL — ABNORMAL HIGH (ref <150)

## 2021-07-11 LAB — PSA: PSA: 0.59 ng/mL (ref <=4.00)

## 2021-07-11 NOTE — Chronic Care Management (AMB) (Signed)
  Chronic Care Management   Note  07/11/2021 Name: Brandon [name] MRN: [id number] DOB: April 18, 1951  Brandon [name] is a 70 y.o. year old male who is a primary care patient of Metheney, Rene Kocher, MD. I reached out to Brandon [name] by phone today in response to a referral sent by Brandon [name]'s PCP, Hali Marry, MD.   Brandon [name] was given information about Chronic Care Management services today including:  CCM service includes personalized support from designated clinical staff supervised by his physician, including individualized plan of care and coordination with other care providers 24/7 contact phone numbers for assistance for urgent and routine care needs. Service will only be billed when office clinical staff spend 20 minutes or more in a month to coordinate care. Only one practitioner may furnish and bill the service in a calendar month. The patient may stop CCM services at any time (effective at the end of the month) by phone call to the office staff.   Patient agreed to services and verbal consent obtained.   Follow up plan: [place]

## 2021-07-12 NOTE — Progress Notes (Signed)
Hi Brandon [name], your prostate test is normal. Your Triglycerides look better but your LDL is up.  Have you missed or run out of your Crestor recently.  If not then we may need to increase your dose to better control your cholesterol. Your metabolic panel is normal. Your blood count is stable.

## 2021-07-26 ENCOUNTER — Other Ambulatory Visit: Payer: Self-pay | Admitting: Family Medicine

## 2021-07-26 DIAGNOSIS — G43701 Chronic migraine without aura, not intractable, with status migrainosus: Secondary | ICD-10-CM

## 2021-08-22 ENCOUNTER — Telehealth: Payer: Self-pay

## 2021-08-22 NOTE — Chronic Care Management (AMB) (Signed)
Chronic Care Management Pharmacy Assistant   Name: Brandon [name]  MRN: [id number] DOB: Oct 06, 1951  Brandon [name] is an 70 y.o. year old male who presents for his initial CCM visit with the clinical pharmacist.   Recent office visits:  07/04/21 Posey Pronto MD - Seen for medicare annual wellness exam - Labs ordered - No medication changes noted - Follow up in one year   Recent consult visits:  07/05/21 Beverely Low PA - Ophthalmology - Seen for dry eye syndrome - No medication changes noted - Follow up in 26 weeks  06/28/21 Metta Clines DO - Neurology - Seen for migraine - No medication changes noted - Follow up in 6 months  06/22/21 [facility] - Seen for immunization - No medication changes noted - No follow up noted  06/16/21 Ophthalmology - Seen for Horseshoe retinal tear - No medication changes noted - Follow up in 6 weeks  06/08/21 Nanetta Batty - Encounter for preprocedural laboratory examination - No medication changes noted - No follow up noted  05/31/21 Beverely Low PA - Ophthalmology - Seen for dry eye syndrome  - No medication changes noted - Follow up in 6 weeks  05/18/21 - Beverely Low PA - Ophthalmology - Seen for initial visit - No medication changes noted - Follow up in 13 days 04/07/21 Ihor Dow MD - Urology - Seen for Benign prostatic hyperplasia with urinary obstruction - No medication changes noted - Follow up in 1 year    Hospital visits:  None in previous 6 months  Medications: Outpatient Encounter Medications as of 08/22/2021  Medication Sig   albuterol (PROAIR HFA) 108 (90 Base) MCG/ACT inhaler Inhale 2 puffs into the lungs every 6 (six) hours as needed for wheezing or shortness of breath.   Blood Glucose Monitoring Suppl (ONE TOUCH ULTRA 2) w/Device KIT DX DM E11.9 Check fasting blood sugar 2-3 times weekly.   cetirizine (ZYRTEC) 10 MG tablet TAKE 1 TABLET BY MOUTH EVERY DAY   Cholecalciferol (VITAMIN D3 PO) Take 1 tablet by mouth daily.    CINNAMON PO Take 2 capsules by mouth daily.   clindamycin (CLEOCIN T) 1 % lotion Apply topically 2 (two) times daily.   clindamycin (CLEOCIN T) 1 % SWAB    clotrimazole-betamethasone (LOTRISONE) cream Apply topically 2 (two) times daily.   cyclobenzaprine (FLEXERIL) 10 MG tablet    diclofenac sodium (VOLTAREN) 1 % GEL Apply 2 g topically 4 (four) times daily. To affected areas   doxycycline (VIBRA-TABS) 100 MG tablet Take 1 tablet (100 mg total) by mouth 2 (two) times daily.   fluticasone (FLONASE) 50 MCG/ACT nasal spray Place 2 sprays into both nostrils daily.   glucose blood (ONETOUCH ULTRA) test strip CHECK FASTING BLOOD SUGAR 2-3 TIMES WEEKLY.   hydrochlorothiazide (HYDRODIURIL) 25 MG tablet Take 1 tablet  by mouth daily.   hydrocortisone (ANUSOL-HC) 25 MG suppository Place 25 mg rectally as needed. Reported on 04/19/2016   hydrocortisone 2.5 % cream Apply topically as needed. Reported on 04/19/2016   ipratropium (ATROVENT) 0.06 % nasal spray Place 2 sprays into both nostrils every 4 (four) hours as needed for rhinitis.   Iron-Folic Acid-Vit M35 (IRON FORMULA PO) Take by mouth daily. In liquid form   Lancets 30G MISC DX DM E11.9 Check fasting blood sugar 2-3 times weekly.   losartan (COZAAR) 100 MG tablet TAKE 1 TABLET BY MOUTH EVERY DAY   montelukast (SINGULAIR) 10 MG tablet TAKE 1 TABLET BY MOUTH AT BEDTIME.  Multiple Vitamin (MULTIVITAMIN) tablet Take 1 tablet by mouth daily.   Naftifine HCl 2 % CREA Apply 1 application topically 2 times a day   Omega-3 Fatty Acids (FISH OIL) 1000 MG CAPS Take by mouth.   pantoprazole (PROTONIX) 40 MG tablet Take 40 mg by mouth 2 (two) times daily.   propranolol ER (INDERAL LA) 60 MG 24 hr capsule Take 1 capsule (60 mg total) by mouth daily.   rosuvastatin (CRESTOR) 10 MG tablet TAKE 1 TABLET BY MOUTH EVERYDAY AT BEDTIME   sertraline (ZOLOFT) 100 MG tablet Take 1 tablet by mouth daily.   tadalafil (CIALIS) 5 MG tablet Take 5 mg by mouth daily.    tamsulosin (FLOMAX) 0.4 MG CAPS capsule Take 0.4 mg by mouth.   tiotropium (SPIRIVA) 18 MCG inhalation capsule Place 18 mcg into inhaler and inhale daily.   topiramate (TOPAMAX) 50 MG tablet TAKE 1 TABLET BY MOUTH TWICE A DAY   TRAZODONE HCL PO Take 100 mg by mouth daily.   triamcinolone ointment (KENALOG) 0.1 %    valACYclovir (VALTREX) 500 MG tablet TAKE 1 TABLET BY MOUTH EVERY DAY   No facility-administered encounter medications on file as of 08/22/2021.    Care Gaps: COLONOSCOPY Overdue since 08/04/2020 COVID-19 Vaccine (Dose 5 - Booster for [employer] series) Overdue since 03/29/2021   albuterol (PROAIR HFA) 108 (90 Base) MCG/ACT inhaler - Last filled 01/30/18 cetirizine (ZYRTEC) 10 MG tablet- Last filled 11/09/19 90 DS  Cholecalciferol (VITAMIN D3 PO) - Last filled 11/02/20 CINNAMON PO- Last filled 09/25/19  clindamycin (CLEOCIN T) 1 % lotion- Last filled 08/04/2021 30 DS  clotrimazole-betamethasone (LOTRISONE) cream- Last filled 05/16/17 cyclobenzaprine (FLEXERIL) 10 MG tablet- Last filled 08/01/2019 7 DS diclofenac sodium (VOLTAREN) 1 % GEL- Last filled 01/30/18 doxycycline (VIBRA-TABS) 100 MG tablet- Last filled 07/04/2021 10 DS fluticasone (FLONASE) 50 MCG/ACT nasal spray- Last filled 04/19/16 hydrochlorothiazide (HYDRODIURIL) 25 MG tablet- Last filled 06/04/2021 90 DS  hydrocortisone acetate 30 MG suppository- Last filled 02/11/2020 30 DS  hydrocortisone 2.5 % cream- Last filled 10/29/13  ipratropium (ATROVENT) 0.06 % nasal spray- Last filled 16/10/96  Iron-Folic Acid-Vit E45 (IRON FORMULA PO) - Last filled 09/25/19  losartan Potassium 100 MG tablet- Last filled 07/10/21 90 DS  montelukast (SINGULAIR) 10 MG tablet- Last filled 01/29/2019 30 DS  Multiple Vitamin (MULTIVITAMIN) tablet- Last filled 09/25/19  Naftifine HCl 2 % CREA- Last filled 06/15/21 30 DS Omega-3 Fatty Acids (FISH OIL) 1000 MG CAPS- Last filled 12/28/19  pantoprazole potassium  40 MG tablet- Last filled 06/17/21 30  DS  propranolol ER (INDERAL LA) 60 MG 24 hr capsule- Last filled 02/14/21 rosuvastatin (CRESTOR) 10 MG tablet- Last filled 08/07/2021 90 DS  sertraline (ZOLOFT) 100 MG tablet- Last filled 07/12/21 90 DS  tadalafil (CIALIS) 5 MG tablet- Last filled 04/01/2021 30 DS  tamsulosin (FLOMAX) 0.4 MG CAPS capsule- Last filled 06/09/2021 90 DS  tiotropium (SPIRIVA) 18 MCG inhalation capsule- Last filled 06/15/21 90 DS  topiramate (TOPAMAX) 50 MG tablet- Last filled 07/26/2021 90 DS  TRAZODONE HCL PO- Last filled 07/15/2021 90 DS  triamcinolone ointment (KENALOG) 0.1 %- Last filled 09/25/19  valACYclovir (VALTREX) 500 MG tablet- Last filled 05/30/2021 90 DS  DICYCLOMINE  CAP 10MG - Last filled 08/04/2021 30 DS  METHOCARBAM  TAB 500MG - Last filled 08/10/21 10 DS       Star Rating Drugs: losartan Potassium 100 MG tablet- Last filled 07/10/21 90 DS  rosuvastatin (CRESTOR) 10 MG tablet- Last filled 08/07/2021 90 DS  Andee Poles, CMA

## 2021-08-24 ENCOUNTER — Ambulatory Visit (INDEPENDENT_AMBULATORY_CARE_PROVIDER_SITE_OTHER): Payer: Medicare Other | Admitting: Pharmacist

## 2021-08-24 ENCOUNTER — Other Ambulatory Visit: Payer: Self-pay

## 2021-08-24 DIAGNOSIS — E785 Hyperlipidemia, unspecified: Secondary | ICD-10-CM

## 2021-08-24 DIAGNOSIS — I10 Essential (primary) hypertension: Secondary | ICD-10-CM

## 2021-08-24 NOTE — Progress Notes (Signed)
Chronic Care Management Pharmacy Note  08/24/2021 Name:  Brandon [name] MRN:  [id number] DOB:  Dec 25, 1950  Summary: addressed HTN, HLD  Recommendations/Changes made from today's visit: none, pt to check BP at home and provide numbers at our next discussion. Also will implement diet changes for cholesterol improvement, recommend sooner than annual recheck (~12 weeks) and increase rosuvastatin dose if not at goal.  Plan: f/u with pharmacist in 1 month  Subjective: Brandon [name] is an 70 y.o. year old male who is a primary patient of Metheney, Rene Kocher, MD.  The CCM team was consulted for assistance with disease management and care coordination needs.    Engaged with patient by telephone for initial visit in response to provider referral for pharmacy case management and/or care coordination services.   Consent to Services:  The patient was given information about Chronic Care Management services, agreed to services, and gave verbal consent prior to initiation of services.  Please see initial visit note for detailed documentation.   Patient Care Team: Hali Marry, MD as PCP - General (Family Medicine) Hali Marry, MD as Consulting Physician (Family Medicine) [name], [facility] as Pharmacist (Pharmacist)  Recent office visits:  07/04/21 Posey Pronto MD - Seen for medicare annual wellness exam - Labs ordered - No medication changes noted - Follow up in one year    Recent consult visits:  07/05/21 Beverely Low PA - Ophthalmology - Seen for dry eye syndrome - No medication changes noted - Follow up in 26 weeks  06/28/21 Metta Clines DO - Neurology - Seen for migraine - No medication changes noted - Follow up in 6 months  06/22/21 [facility] - Seen for immunization - No medication changes noted - No follow up noted  06/16/21 Ophthalmology - Seen for Horseshoe retinal tear - No medication changes noted - Follow up in 6 weeks  06/08/21 Nanetta Batty -  Encounter for preprocedural laboratory examination - No medication changes noted - No follow up noted  05/31/21 Beverely Low PA - Ophthalmology - Seen for dry eye syndrome  - No medication changes noted - Follow up in 6 weeks  05/18/21 - Beverely Low PA - Ophthalmology - Seen for initial visit - No medication changes noted - Follow up in 13 days 04/07/21 Ihor Dow MD - Urology - Seen for Benign prostatic hyperplasia with urinary obstruction - No medication changes noted - Follow up in 1 year      Hospital visits:  None in previous 6 months  Objective:  Lab Results  Component Value Date   CREATININE 1.12 07/11/2021   CREATININE 1.15 02/04/2020   CREATININE 1.2 12/14/2019    Lab Results  Component Value Date   HGBA1C 4.8 02/02/2020   Last diabetic Eye exam: No results found for: HMDIABEYEEXA  Last diabetic Foot exam: No results found for: HMDIABFOOTEX      Component Value Date/Time   CHOL 187 07/11/2021 0000   TRIG 260 (H) 07/11/2021 0000   HDL 32 (L) 07/11/2021 0000   CHOLHDL 5.8 (H) 07/11/2021 0000   VLDL 39 (H) 06/18/2016 0859   LDLCALC 118 (H) 07/11/2021 0000    Hepatic Function Latest Ref Rng & Units 07/11/2021 12/14/2019 08/04/2019  Total Protein 6.1 - 8.1 g/dL 6.9 - 7.4  Albumin 3.5 - 5.0 - 3.9 -  AST 10 - 35 U/L 22 24 17   ALT 9 - 46 U/L 27 38 17  Alk Phosphatase 38 - 126 U/L - - -  Total Bilirubin 0.2 - 1.2 mg/dL 0.6 - 0.5  Bilirubin, Direct 0.01 - 0.4 - 0.1 -    Lab Results  Component Value Date/Time   TSH 1.76 03/19/2019 07:49 AM   TSH 0.790 10/29/2013 11:26 AM    CBC Latest Ref Rng & Units 07/11/2021 12/14/2019 04/16/2019  WBC 3.8 - 10.8 Thousand/uL 6.1 7.5 6.8  Hemoglobin 13.2 - 17.1 g/dL 16.1 16.4 14.2  Hematocrit 38.5 - 50.0 % 48.8 49 44.1  Platelets 140 - 400 Thousand/uL 150 - 160    Lab Results  Component Value Date/Time   VD25OH 25.2 02/28/2012 12:00 AM    Clinical ASCVD:  The 10-year ASCVD risk score (Arnett DK, et al., 2019) is: 59.7%    Values used to calculate the score:     Age: 75 years     Sex: Male     Is Non-Hispanic African American: Yes     Diabetic: Yes     Tobacco smoker: Yes     Systolic Blood Pressure: 841 mmHg     Is BP treated: Yes     HDL Cholesterol: 32 mg/dL     Total Cholesterol: 187 mg/dL     Social History   Tobacco Use  Smoking Status Former   Packs/day: 1.50   Years: 15.00   Pack years: 22.50   Types: Cigarettes   Quit date: 10/22/1986   Years since quitting: 34.8  Smokeless Tobacco Never  Tobacco Comments   SmokE off and on   BP Readings from Last 3 Encounters:  07/04/21 136/64  06/28/21 136/80  12/21/20 130/74   Pulse Readings from Last 3 Encounters:  07/04/21 87  06/28/21 66  12/21/20 77   Wt Readings from Last 3 Encounters:  07/04/21 197 lb (89.4 kg)  06/28/21 197 lb (89.4 kg)  12/21/20 197 lb 6.4 oz (89.5 kg)    Assessment: Review of patient past medical history, allergies, medications, health status, including review of consultants reports, laboratory and other test data, was performed as part of comprehensive evaluation and provision of chronic care management services.   SDOH:  (Social Determinants of Health) assessments and interventions performed:    CCM Care Plan  Allergies  Allergen Reactions   Amlodipine Rash   Atorvastatin Other (See Comments)    Myalgias    Medications Reviewed Today     Reviewed by Hali Marry, MD (Physician) on 07/04/21 at 1457  Med List Status: <None>   Medication Order Taking? Sig Documenting Provider Last Dose Status Informant  albuterol (PROAIR HFA) 108 (90 Base) MCG/ACT inhaler [id number] Yes Inhale 2 puffs into the lungs every 6 (six) hours as needed for wheezing or shortness of breath. Hali Marry, MD Taking Active   amoxicillin-clavulanate (AUGMENTIN) 875-125 MG tablet [id number] Yes Take 1 tablet by mouth 2 (two) times daily. Hali Marry, MD  Active   Blood Glucose Monitoring Suppl (ONE TOUCH  ULTRA 2) w/Device KIT [id number] Yes DX DM E11.9 Check fasting blood sugar 2-3 times weekly. Hali Marry, MD Taking Active   cetirizine (ZYRTEC) 10 MG tablet [id number] Yes TAKE 1 TABLET BY MOUTH EVERY DAY Emeterio Reeve, DO Taking Active   Cholecalciferol (VITAMIN D3 PO) [id number] Yes Take 1 tablet by mouth daily. [provider] Taking Active   CINNAMON PO [id number] Yes Take 2 capsules by mouth daily. [provider] Taking Active   clindamycin (CLEOCIN T) 1 % lotion [id number] Yes Apply topically 2 (two) times daily. [provider] Taking Active  clindamycin (CLEOCIN T) 1 % SWAB [id number] Yes  [provider] Taking Active            Med Note ([facility], HEATHER C   Mon Mar 10, 2018  3:11 PM)    clotrimazole-betamethasone (LOTRISONE) cream [id number] Yes Apply topically 2 (two) times daily. Hali Marry, MD Taking Active   cyclobenzaprine (FLEXERIL) 10 MG tablet [id number] Yes  [provider] Taking Active   diclofenac sodium (VOLTAREN) 1 % GEL [id number] Yes Apply 2 g topically 4 (four) times daily. To affected areas Hali Marry, MD Taking Active   fluticasone [facility]) 50 MCG/ACT nasal spray [id number] Yes Place 2 sprays into both nostrils daily. Gregor Hams, MD Taking Active   glucose blood [facility] ULTRA) test strip [id number] Yes CHECK FASTING BLOOD SUGAR 2-3 TIMES WEEKLY. Hali Marry, MD Taking Active   hydrochlorothiazide (HYDRODIURIL) 25 MG tablet [id number] Yes Take 1 tablet  by mouth daily. Hali Marry, MD Taking Active   hydrocortisone (ANUSOL-HC) 25 MG suppository [id number] Yes Place 25 mg rectally as needed. Reported on 04/19/2016 [provider] Taking Active   hydrocortisone 2.5 % cream [id number] Yes Apply topically as needed. Reported on 04/19/2016 [provider] Taking Active   ipratropium (ATROVENT) 0.06 % nasal spray [id number] Yes Place 2 sprays into both nostrils every 4  (four) hours as needed for rhinitis. Gregor Hams, MD Taking Active   Iron-Folic Acid-Vit E39 (IRON FORMULA PO) [id number] Yes Take by mouth daily. In liquid form [provider] Taking Active   Lancets 30G MISC [id number] Yes DX DM E11.9 Check fasting blood sugar 2-3 times weekly. Hali Marry, MD Taking Active   losartan (COZAAR) 100 MG tablet [id number] Yes Take 1 tablet (100 mg total) by mouth daily. Hali Marry, MD Taking Active   montelukast (SINGULAIR) 10 MG tablet [id number] Yes TAKE 1 TABLET BY MOUTH AT BEDTIME. Hali Marry, MD Taking Active   Multiple Vitamin (MULTIVITAMIN) tablet [id number] Yes Take 1 tablet by mouth daily. [provider] Taking Active   Naftifine HCl 2 % CREA [id number] Yes Apply 1 application topically 2 times a day Hali Marry, MD Taking Active   Omega-3 Fatty Acids (FISH OIL) 1000 MG CAPS [id number] Yes Take by mouth. [provider] Taking Active   pantoprazole (PROTONIX) 40 MG tablet [id number] Yes Take 40 mg by mouth 2 (two) times daily. [provider] Taking Active   propranolol ER (INDERAL LA) 60 MG 24 hr capsule [id number] Yes Take 1 capsule (60 mg total) by mouth daily. Pieter Partridge, DO Taking Active   rosuvastatin (CRESTOR) 10 MG tablet [id number] Yes TAKE 1 TABLET BY MOUTH EVERYDAY AT BEDTIME Hali Marry, MD Taking Active   sertraline (ZOLOFT) 100 MG tablet [id number] Yes Take 1 tablet by mouth daily. [provider] Taking Active   tadalafil (CIALIS) 5 MG tablet [id number] Yes Take 5 mg by mouth daily. [provider] Taking Active   tamsulosin (FLOMAX) 0.4 MG CAPS capsule [id number] Yes Take 0.4 mg by mouth. [provider] Taking Active   tiotropium (SPIRIVA) 18 MCG inhalation capsule [id number] Yes Place 18 mcg into inhaler and inhale daily. [provider] Taking Active   topiramate (TOPAMAX) 50 MG tablet [id number] Yes TAKE 1 TABLET BY MOUTH TWICE  A DAY Hali Marry, MD Taking Active   TRAZODONE HCL PO [id number] Yes Take 100 mg by mouth daily. [provider] Taking Active   triamcinolone ointment (KENALOG)  0.1 % [id number] Yes  [provider] Taking Active   valACYclovir (VALTREX) 500 MG tablet [id number] Yes TAKE 1 TABLET BY MOUTH EVERY DAY Hali Marry, MD Taking Active             Patient Active Problem List   Diagnosis Date Noted   Tingling in extremities 10/04/2020   Tinnitus of both ears 10/04/2020   Deviated septum 10/04/2020   Low ferritin 08/04/2019   Delta beta thalassemia ([place]) 04/16/2019   Asthma 03/10/2018   Primary osteoarthritis of right hand 01/30/2018   Migraine with aura and with status migrainosus, not intractable 11/21/2017   Recurrent genital herpes simplex 80/32/1224   Eosinophilic esophagitis 82/50/0370   Lumbar spondylosis with myelopathy 06/14/2017   OSA on CPAP 05/16/2017   Hearing loss 01/31/2017   Pulmonary nodule 01/31/2017   Kidney lesion, native, left 01/31/2017   Depression, recurrent ([place]) 01/31/2017   Chronic cough 04/19/2016   Insomnia 11/18/2014   Gout 12/03/2013   Onychomycosis 12/03/2013   B12 deficiency 12/03/2013   Unspecified vitamin D deficiency 12/03/2013   Liver fibrosis 12/03/2013   Essential hypertension, benign 10/29/2013   History of hepatitis C virus infection 10/29/2013   GERD (gastroesophageal reflux disease) 10/29/2013   Hyperlipidemia 10/29/2013   BPH (benign prostatic hyperplasia) 10/29/2013   ED (erectile dysfunction) 10/29/2013   Hemorrhoid 10/29/2013   HSV antigen DIF positive 11/27/2011   Sciatic neuropathy 08/10/2011   Pain of lumbosacral spine 08/10/2011   Hepatitis C antibody test positive 08/10/2011    Immunization History  Administered Date(s) Administered   Influenza Split 09/03/2018   Influenza, High Dose Seasonal PF 07/22/2020   Influenza,inj,Quad PF,6+ Mos 07/14/2014, 08/23/2015   Influenza-Unspecified  10/01/2011, 08/19/2012, 08/03/2013, 08/15/2016, 08/08/2017, 08/22/2018, 08/06/2019   Pneumococcal Conjugate-13 10/01/2016   Pneumococcal Polysaccharide-23 01/15/2012, 08/25/2015   Td (Adult) 12/12/2018   Tdap 10/22/2006, 11/18/2014   Zoster Recombinat (Shingrix) 04/18/2021, 06/22/2021   Zoster, Live 11/18/2014    Conditions to be addressed/monitored: HTN and HLD  There are no care plans that you recently modified to display for this patient.   Medication Assistance: None required.  Patient affirms current coverage meets needs.  Patient's preferred pharmacy is:  [id number] [place], [place] - [id number] [address]Phone: [phone]: [phone] [address] [place] [address]Phone: [phone]: [phone] [place] [address]Phone: [phone]: [address] [place]Phone: [phone]: [phone] Uses pill box? No - pulls out of bottles one by one Pt endorses 100% compliance  Follow Up:  Patient agrees to Care Plan and Follow-up.  Plan: Telephone follow up appointment with care management team member scheduled for:  1 month  KLarinda Buttery PharmD Clinical Pharmacist CAscension Providence [facility] CenterPrimary Care At MBlack River Ambulatory Surgery [phone]

## 2021-08-24 NOTE — Patient Instructions (Addendum)
Cholesterol Content in Foods Cholesterol is a waxy, fat-like substance that helps to carry fat in the blood. The body needs cholesterol in small amounts, but too much cholesterol can cause damage to the arteries and heart. Most people should eat less than 200 milligrams (mg) of cholesterol a day. Foods with cholesterol Cholesterol is found in animal-based foods, such as meat, seafood, and dairy. Generally, low-fat dairy and lean meats have less cholesterol than full-fat dairy and fatty meats. The milligrams of cholesterol per serving (mg per serving) of common cholesterol-containing foods are listed below. Meat and other proteins Egg -- one large whole egg has 186 mg. Veal shank -- 4 oz has 141 mg. Lean ground [place] (93% lean) -- 4 oz has 118 mg. Fat-trimmed lamb loin -- 4 oz has 106 mg. Lean ground beef (90% lean) -- 4 oz has 100 mg. Lobster -- 3.5 oz has 90 mg. Pork loin chops -- 4 oz has 86 mg. Canned salmon -- 3.5 oz has 83 mg. Fat-trimmed beef top loin -- 4 oz has 78 mg. Frankfurter -- 1 frank (3.5 oz) has 77 mg. Crab -- 3.5 oz has 71 mg. Roasted chicken without skin, white meat -- 4 oz has 66 mg. Light bologna -- 2 oz has 45 mg. Deli-cut [place] -- 2 oz has 31 mg. Canned tuna -- 3.5 oz has 31 mg. Berniece Salines -- 1 oz has 29 mg. Oysters and mussels (raw) -- 3.5 oz has 25 mg. Mackerel -- 1 oz has 22 mg. Trout -- 1 oz has 20 mg. Pork sausage -- 1 link (1 oz) has 17 mg. Salmon -- 1 oz has 16 mg. Tilapia -- 1 oz has 14 mg. Dairy Soft-serve ice cream --  cup (4 oz) has 103 mg. Whole-milk yogurt -- 1 cup (8 oz) has 29 mg. Cheddar cheese -- 1 oz has 28 mg. American cheese -- 1 oz has 28 mg. Whole milk -- 1 cup (8 oz) has 23 mg. 2% milk -- 1 cup (8 oz) has 18 mg. Cream cheese -- 1 tablespoon (Tbsp) has 15 mg. Cottage cheese --  cup (4 oz) has 14 mg. Low-fat (1%) milk -- 1 cup (8 oz) has 10 mg. Sour cream -- 1 Tbsp has 8.5 mg. Low-fat yogurt -- 1 cup (8 oz) has 8 mg. Nonfat Greek  yogurt -- 1 cup (8 oz) has 7 mg. Half-and-half cream -- 1 Tbsp has 5 mg. Fats and oils Cod liver oil -- 1 tablespoon (Tbsp) has 82 mg. Butter -- 1 Tbsp has 15 mg. Lard -- 1 Tbsp has 14 mg. Bacon grease -- 1 Tbsp has 14 mg. Mayonnaise -- 1 Tbsp has 5-10 mg. Margarine -- 1 Tbsp has 3-10 mg. Exact amounts of cholesterol in these foods may vary depending on specific ingredients and brands. Foods without cholesterol Most plant-based foods do not have cholesterol unless you combine them with a food that has cholesterol. Foods without cholesterol include: Grains and cereals. Vegetables. Fruits. Vegetable oils, such as olive, canola, and sunflower oil. Legumes, such as peas, beans, and lentils. Nuts and seeds. Egg whites. Summary The body needs cholesterol in small amounts, but too much cholesterol can cause damage to the arteries and heart. Most people should eat less than 200 milligrams (mg) of cholesterol a day. This information is not intended to replace advice given to you by your health care provider. Make sure you discuss any questions you have with your health care provider. Document Revised: 01/19/2020 Document Reviewed: 02/29/2020 Elsevier Patient Education  [place].    Visit Information   PATIENT GOALS/PLAN OF CARE:  Care Plan : Medication Management  Updates made by Brandon [name], [place] since 08/24/2021 12:00 AM     Problem: HTN, HLD      Long-Range Goal: Disease Progresion Prevention   Start Date: 08/24/2021  This Visit's Progress: On track  Priority: High  Note:   Current Barriers:  None at present  Pharmacist Clinical Goal(s):  Over the next 30 days, patient will adhere to plan to optimize therapeutic regimen for blood pressure, cholesterol as evidenced by report of adherence to recommended medication management changes through collaboration with PharmD and provider.   Interventions: 1:1 collaboration with Brandon Marry, MD regarding  development and update of comprehensive plan of care as evidenced by provider attestation and co-signature Inter-disciplinary care team collaboration (see longitudinal plan of care) Comprehensive medication review performed; medication list updated in electronic medical record  Hypertension:  Controlled; current treatment:hctz 90m daily, losartan 1029mdaily ;   Current home readings: not currently checking  Denies hypotensive/hypertensive symptoms  Counseled on technique of checking BP at home Recommended patient check BP 2-3x per week and write down numbers for discussion at next phone call,  Hyperlipidemia:  Uncontrolled; current treatment:rosuvastatin 103maily; LDL 118  Current dietary patterns: states he has had a poor diet recently and states he will get back on track  Counseled on nutrition and included information in instructions for cholesterol-mindful dietary choices Recommended continue current regimen, consider repeat lipid panel in ~12 weeks Asthma:  Controlled; current treatment:albuterol PRN, spiriva daily, ;  Recommended continue current regimen  Patient Goals/Self-Care Activities Over the next 30 days, patient will:  take medications as prescribed and check blood pressure 2-3x per week, document, and provide at future appointments  Follow Up Plan: Telephone follow up appointment with care management team member scheduled for:  1 month      Consent to CCM Services: [name] given information about Chronic Care Management services including:  CCM service includes personalized support from designated clinical staff supervised by his physician, including individualized plan of care and coordination with other care providers 24/7 contact phone numbers for assistance for urgent and routine care needs. Service will only be billed when office clinical staff spend 20 minutes or more in a month to coordinate care. Only one practitioner may furnish and bill the  service in a calendar month. The patient may stop CCM services at any time (effective at the end of the month) by phone call to the office staff. The patient will be responsible for cost sharing (co-pay) of up to 20% of the service fee (after annual deductible is met).  Patient agreed to services and verbal consent obtained.   Patient verbalizes understanding of instructions provided today and agrees to view in [facility] [place] Telephone follow up appointment with care management team member scheduled for:  KeeDarius [name]

## 2021-08-28 ENCOUNTER — Telehealth: Payer: Self-pay | Admitting: Family Medicine

## 2021-08-28 NOTE — Telephone Encounter (Signed)
Chart updated.  Charyl Bigger, CMA

## 2021-08-28 NOTE — Telephone Encounter (Signed)
Patient dropped off vaccine sheet with dates (flu) that he just had today that he wanted added to his chart. Placed in provider box. AM

## 2021-08-29 ENCOUNTER — Other Ambulatory Visit: Payer: Self-pay | Admitting: Family Medicine

## 2021-08-29 DIAGNOSIS — A6000 Herpesviral infection of urogenital system, unspecified: Secondary | ICD-10-CM

## 2021-08-31 ENCOUNTER — Other Ambulatory Visit: Payer: Self-pay | Admitting: Family Medicine

## 2021-09-03 ENCOUNTER — Other Ambulatory Visit: Payer: Self-pay | Admitting: Family Medicine

## 2021-09-03 DIAGNOSIS — I10 Essential (primary) hypertension: Secondary | ICD-10-CM

## 2021-09-11 DIAGNOSIS — Z23 Encounter for immunization: Secondary | ICD-10-CM | POA: Diagnosis not present

## 2021-09-26 ENCOUNTER — Ambulatory Visit (INDEPENDENT_AMBULATORY_CARE_PROVIDER_SITE_OTHER): Payer: Medicare Other | Admitting: Pharmacist

## 2021-09-26 ENCOUNTER — Telehealth: Payer: Medicare Other

## 2021-09-26 ENCOUNTER — Other Ambulatory Visit: Payer: Self-pay

## 2021-09-26 DIAGNOSIS — E785 Hyperlipidemia, unspecified: Secondary | ICD-10-CM

## 2021-09-26 DIAGNOSIS — I10 Essential (primary) hypertension: Secondary | ICD-10-CM

## 2021-09-26 NOTE — Patient Instructions (Addendum)
Visit Information  Thank you for taking time to visit with me today. Please don't hesitate to contact me if I can be of assistance to you before our next scheduled telephone appointment.  Following are the goals we discussed today:   Patient Goals/Self-Care Activities Over the next 30 days, patient will:  - take medications as prescribed and check blood pressure 2-3x per week, document, and provide at future appointments - implement DASH diet, increase physical activity in order to progress toward goal of reducing blood pressure medications  Follow Up Plan: Face to face follow up appointment with pharmacist scheduled for:  1 month   Please call the care guide team at [phone] if you need to cancel or reschedule your appointment.    Patient verbalizes understanding of instructions provided today and agrees to view in [place].   Brandon [name]   DASH Eating Plan DASH stands for Dietary Approaches to Stop Hypertension. The DASH eating plan is a healthy eating plan that has been shown to: Reduce high blood pressure (hypertension). Reduce your risk for type 2 diabetes, heart disease, and stroke. Help with weight loss. What are tips for following this plan? Reading food labels Check food labels for the amount of salt (sodium) per serving. Choose foods with less than 5 percent of the Daily Value of sodium. Generally, foods with less than 300 milligrams (mg) of sodium per serving fit into this eating plan. To find whole grains, look for the word "whole" as the first word in the ingredient list. Shopping Buy products labeled as "low-sodium" or "no salt added." Buy fresh foods. Avoid canned foods and pre-made or frozen meals. Cooking Avoid adding salt when cooking. Use salt-free seasonings or herbs instead of table salt or sea salt. Check with your health care provider or pharmacist before using salt substitutes. Do not fry foods. Cook foods using healthy methods such as baking, boiling,  grilling, roasting, and broiling instead. Cook with heart-healthy oils, such as olive, canola, avocado, soybean, or sunflower oil. Meal planning  Eat a balanced diet that includes: 4 or more servings of fruits and 4 or more servings of vegetables each day. Try to fill one-half of your plate with fruits and vegetables. 6-8 servings of whole grains each day. Less than 6 oz (170 g) of lean meat, poultry, or fish each day. A 3-oz (85-g) serving of meat is about the same size as a deck of cards. One egg equals 1 oz (28 g). 2-3 servings of low-fat dairy each day. One serving is 1 cup (237 mL). 1 serving of nuts, seeds, or beans 5 times each week. 2-3 servings of heart-healthy fats. Healthy fats called omega-3 fatty acids are found in foods such as walnuts, flaxseeds, fortified milks, and eggs. These fats are also found in cold-water fish, such as sardines, salmon, and mackerel. Limit how much you eat of: Canned or prepackaged foods. Food that is high in trans fat, such as some fried foods. Food that is high in saturated fat, such as fatty meat. Desserts and other sweets, sugary drinks, and other foods with added sugar. Full-fat dairy products. Do not salt foods before eating. Do not eat more than 4 egg yolks a week. Try to eat at least 2 vegetarian meals a week. Eat more home-cooked food and less restaurant, buffet, and fast food. Lifestyle When eating at a restaurant, ask that your food be prepared with less salt or no salt, if possible. If you drink alcohol: Limit how much you use  to: 0-1 drink a day for women who are not pregnant. 0-2 drinks a day for men. Be aware of how much alcohol is in your drink. In the [place] one drink equals one 12 oz bottle of beer (355 mL), one 5 oz glass of wine (148 mL), or one 1 oz glass of hard liquor (44 mL). General information Avoid eating more than 2,300 mg of salt a day. If you have hypertension, you may need to reduce your sodium intake to 1,500 mg a  day. Work with your health care provider to maintain a healthy body weight or to lose weight. Ask what an ideal weight is for you. Get at least 30 minutes of exercise that causes your heart to beat faster (aerobic exercise) most days of the week. Activities may include walking, swimming, or biking. Work with your health care provider or dietitian to adjust your eating plan to your individual calorie needs. What foods should I eat? Fruits All fresh, dried, or frozen fruit. Canned fruit in natural juice (without added sugar). Vegetables Fresh or frozen vegetables (raw, steamed, roasted, or grilled). Low-sodium or reduced-sodium tomato and vegetable juice. Low-sodium or reduced-sodium tomato sauce and tomato paste. Low-sodium or reduced-sodium canned vegetables. Grains Whole-grain or whole-wheat bread. Whole-grain or whole-wheat pasta. Brown rice. [name]. Bulgur. Whole-grain and low-sodium cereals. Pita bread. Low-fat, low-sodium crackers. Whole-wheat flour tortillas. Meats and other proteins Skinless chicken or [place]. Ground chicken or [place]. Pork with fat trimmed off. Fish and seafood. Egg whites. Dried beans, peas, or lentils. Unsalted nuts, nut butters, and seeds. Unsalted canned beans. Lean cuts of beef with fat trimmed off. Low-sodium, lean precooked or cured meat, such as sausages or meat loaves. Dairy Low-fat (1%) or fat-free (skim) milk. Reduced-fat, low-fat, or fat-free cheeses. Nonfat, low-sodium ricotta or cottage cheese. Low-fat or nonfat yogurt. Low-fat, low-sodium cheese. Fats and oils Soft margarine without trans fats. Vegetable oil. Reduced-fat, low-fat, or light mayonnaise and salad dressings (reduced-sodium). Canola, safflower, olive, avocado, soybean, and sunflower oils. Avocado. Seasonings and condiments Herbs. Spices. Seasoning mixes without salt. Other foods Unsalted popcorn and pretzels. Fat-free sweets. The items listed above may not be a complete list of foods  and beverages you can eat. Contact a dietitian for more information. What foods should I avoid? Fruits Canned fruit in a light or heavy syrup. Fried fruit. Fruit in cream or butter sauce. Vegetables Creamed or fried vegetables. Vegetables in a cheese sauce. Regular canned vegetables (not low-sodium or reduced-sodium). Regular canned tomato sauce and paste (not low-sodium or reduced-sodium). Regular tomato and vegetable juice (not low-sodium or reduced-sodium). Angie Fava. Olives. Grains Baked goods made with fat, such as croissants, muffins, or some breads. Dry pasta or rice meal packs. Meats and other proteins Fatty cuts of meat. Ribs. Fried meat. Berniece Salines. Bologna, salami, and other precooked or cured meats, such as sausages or meat loaves. Fat from the back of a pig (fatback). Bratwurst. Salted nuts and seeds. Canned beans with added salt. Canned or smoked fish. Whole eggs or egg yolks. Chicken or [place] with skin. Dairy Whole or 2% milk, cream, and half-and-half. Whole or full-fat cream cheese. Whole-fat or sweetened yogurt. Full-fat cheese. Nondairy creamers. Whipped toppings. Processed cheese and cheese spreads. Fats and oils Butter. Stick margarine. Lard. Shortening. Ghee. Bacon fat. Tropical oils, such as coconut, palm kernel, or palm oil. Seasonings and condiments Onion salt, garlic salt, seasoned salt, table salt, and sea salt. Worcestershire sauce. Tartar sauce. Barbecue sauce. Teriyaki sauce. Soy sauce, including reduced-sodium. Steak sauce. Canned  and packaged gravies. Fish sauce. Oyster sauce. Cocktail sauce. Store-bought horseradish. Ketchup. Mustard. Meat flavorings and tenderizers. Bouillon cubes. Hot sauces. Pre-made or packaged marinades. Pre-made or packaged taco seasonings. Relishes. Regular salad dressings. Other foods Salted popcorn and pretzels. The items listed above may not be a complete list of foods and beverages you should avoid. Contact a dietitian for more  information. Where to find more information National Heart, Lung, and Blood Institute: https://wilson-eaton.com/ American Heart Association: www.heart.org Academy of Nutrition and Dietetics: www.eatright.[place]: www.kidney.org Summary The DASH eating plan is a healthy eating plan that has been shown to reduce high blood pressure (hypertension). It may also reduce your risk for type 2 diabetes, heart disease, and stroke. When on the DASH eating plan, aim to eat more fresh fruits and vegetables, whole grains, lean proteins, low-fat dairy, and heart-healthy fats. With the DASH eating plan, you should limit salt (sodium) intake to 2,300 mg a day. If you have hypertension, you may need to reduce your sodium intake to 1,500 mg a day. Work with your health care provider or dietitian to adjust your eating plan to your individual calorie needs. This information is not intended to replace advice given to you by your health care provider. Make sure you discuss any questions you have with your health care provider. Document Revised: 09/11/2019 Document Reviewed: 09/11/2019 Elsevier Patient Education  2022 [employer].

## 2021-09-26 NOTE — Progress Notes (Signed)
Chronic Care Management Pharmacy Note  09/26/2021 Name:  Brandon [name] MRN:  [id number] DOB:  [birth year]  Summary: addressed HTN, HLD. Patient expressed a goal of eliminating or reducing dosage of blood pressure medications. He states he wants to really fine-tune diet and exercise to achieve this. We discussed details of current nutrition & activity level as well as recommendations.   Current BP readings as follows: 132/78 136/64 136/80 139/74 149/79 146/82  Recommendations/Changes made from today's visit:   No medication changes, but patient [facility] as right now he is taking two of the 181mtablets.  Patient to continue checking BP readings at home & document for future visits.  Also will implement diet changes for blood pressure & cholesterol improvement, recommend sooner than annual recheck (~12 weeks) and continue to titrate rosuvastatin dose if not at goal.   Plan: f/u with pharmacist in 1 month  Subjective: BeHillard Goodwines an [age].o. year old male who is a primary patient of Metheney, CaRene KocherMD.  The CCM team was consulted for assistance with disease management and care coordination needs.    Engaged with patient by telephone for follow up visit in response to provider referral for pharmacy case management and/or care coordination services.   Consent to Services:  The patient was given information about Chronic Care Management services, agreed to services, and gave verbal consent prior to initiation of services.  Please see initial visit note for detailed documentation.   Patient Care Team: MeHali MarryMD as PCP - General (Family Medicine) MeHali MarryMD as Consulting Physician (Family Medicine) [facility] Pharmacist (Pharmacist)  Recent office visits:  07/04/21 CaPosey ProntoD - Seen for medicare annual wellness exam - Labs ordered - No medication  changes noted - Follow up in one year    Recent consult visits:  07/05/21 NaBeverely LowA - Ophthalmology - Seen for dry eye syndrome - No medication changes noted - Follow up in 26 weeks  06/28/21 AdMetta ClinesO - Neurology - Seen for migraine - No medication changes noted - Follow up in 6 months  06/22/21 [facility] - Seen for immunization - No medication changes noted - No follow up noted  06/16/21 Ophthalmology - Seen for [place] retinal tear - No medication changes noted - Follow up in 6 weeks  06/08/21 AlNanetta Batty Encounter for preprocedural laboratory examination - No medication changes noted - No follow up noted  05/31/21 NaBeverely LowA - Ophthalmology - Seen for dry eye syndrome  - No medication changes noted - Follow up in 6 weeks  05/18/21 - NaBeverely LowA - Ophthalmology - Seen for initial visit - No medication changes noted - Follow up in 13 days 04/07/21 RoIhor DowD - Urology - Seen for Benign prostatic hyperplasia with urinary obstruction - No medication changes noted - Follow up in 1 year      Hospital visits:  None in previous 6 months  Objective:  Lab Results  Component Value Date   CREATININE 1.12 07/11/2021   CREATININE 1.15 02/04/2020   CREATININE 1.2 12/14/2019    Lab Results  Component Value Date   HGBA1C 4.8 02/02/2020   Last diabetic Eye exam: No results found for: HMDIABEYEEXA  Last diabetic Foot exam: No results found for: HMDIABFOOTEX      Component Value Date/Time   CHOL 187 07/11/2021 0000  TRIG 260 (H) 07/11/2021 0000   HDL 32 (L) 07/11/2021 0000   CHOLHDL 5.8 (H) 07/11/2021 0000   VLDL 39 (H) 06/18/2016 0859   LDLCALC 118 (H) 07/11/2021 0000    Hepatic Function Latest Ref Rng & Units 07/11/2021 12/14/2019 08/04/2019  Total Protein 6.1 - 8.1 g/dL 6.9 - 7.4  Albumin 3.5 - 5.0 - 3.9 -  AST 10 - 35 U/L _0 ALT 9 - 46 U/L 27 38 17  Alk Phosphatase 38 - 126 U/L - - -  Total Bilirubin 0.2 - 1.2 mg/dL 0.6 - 0.5  Bilirubin, Direct  0.01 - 0.4 - 0.1 -    Lab Results  Component Value Date/Time   TSH 1.76 03/19/2019 07:49 AM   TSH 0.790 10/29/2013 11:26 AM    CBC Latest Ref Rng & Units 07/11/2021 12/14/2019 04/16/2019  WBC 3.8 - 10.8 Thousand/uL 6.1 7.5 6.8  Hemoglobin 13.2 - 17.1 g/dL 16.1 16.4 14.2  Hematocrit 38.5 - 50.0 % 48.8 49 44.1  Platelets 140 - 400 Thousand/uL 150 - 160    Lab Results  Component Value Date/Time   VD25OH 25.2 02/28/2012 12:00 AM    Clinical ASCVD:  The 10-year ASCVD risk score (Arnett DK, et al., 2019) is: 59.7%   Values used to calculate the score:     Age: 61 years     Sex: Male     Is Non-Hispanic African American: Yes     Diabetic: Yes     Tobacco smoker: Yes     Systolic Blood Pressure: 426 mmHg     Is BP treated: Yes     HDL Cholesterol: 32 mg/dL     Total Cholesterol: 187 mg/dL     Social History   Tobacco Use  Smoking Status Former   Packs/day: 1.50   Years: 15.00   Pack years: 22.50   Types: Cigarettes   Quit date: 10/22/1986   Years since quitting: 34.9  Smokeless Tobacco Never  Tobacco Comments   SmokE off and on   BP Readings from Last 3 Encounters:  07/04/21 136/64  06/28/21 136/80  12/21/20 130/74   Pulse Readings from Last 3 Encounters:  07/04/21 87  06/28/21 66  12/21/20 77   Wt Readings from Last 3 Encounters:  07/04/21 197 lb (89.4 kg)  06/28/21 197 lb (89.4 kg)  12/21/20 197 lb 6.4 oz (89.5 kg)    Assessment: Review of patient past medical history, allergies, medications, health status, including review of consultants reports, laboratory and other test data, was performed as part of comprehensive evaluation and provision of chronic care management services.   SDOH:  (Social Determinants of Health) assessments and interventions performed:    CCM Care Plan  Allergies  Allergen Reactions   Amlodipine Rash   Atorvastatin Other (See Comments)    Myalgias    Medications Reviewed Today     Reviewed by [name], [facility]  (Pharmacist) on 08/24/21 at [place] List Status: <None>   Medication Order Taking? Sig Documenting Provider Last Dose Status Informant  albuterol (PROAIR HFA) 108 (90 Base) MCG/ACT inhaler [id number] Yes Inhale 2 puffs into the lungs every 6 (six) hours as needed for wheezing or shortness of breath. Hali Marry, MD Taking Active   Blood Glucose Monitoring Suppl (ONE TOUCH ULTRA 2) w/Device KIT [id number] Yes DX DM E11.9 Check fasting blood sugar 2-3 times weekly. Hali Marry, MD Taking Active   cetirizine (ZYRTEC) 10 MG tablet [id number] Yes TAKE 1  TABLET BY MOUTH EVERY DAY Emeterio Reeve, DO Taking Active   Cholecalciferol (VITAMIN D3 PO) [id number] Yes Take 1 tablet by mouth daily. [provider] Taking Active   CINNAMON PO [id number] No Take 2 capsules by mouth daily.  Patient not taking: Reported on 08/24/2021   [provider] Not Taking Active   clindamycin (CLEOCIN T) 1 % lotion [id number] Yes Apply topically 2 (two) times daily. [provider] Taking Active   clindamycin (CLEOCIN T) 1 % SWAB [id number]   [provider]  Active            Med Note ([facility], HEATHER C   Mon Mar 10, 2018  3:11 PM)    clotrimazole-betamethasone (LOTRISONE) cream [id number] Yes Apply topically 2 (two) times daily. Hali Marry, MD Taking Active   cyclobenzaprine (FLEXERIL) 10 MG tablet [id number] Yes Take 10 mg by mouth 3 (three) times daily as needed. [provider] Taking Active   diclofenac sodium (VOLTAREN) 1 % GEL [id number] Yes Apply 2 g topically 4 (four) times daily. To affected areas Hali Marry, MD Taking Active   fluticasone [facility]) 50 MCG/ACT nasal spray [id number] Yes Place 2 sprays into both nostrils daily. Gregor Hams, MD Taking Active   glucose blood [facility] ULTRA) test strip [id number] Yes CHECK FASTING BLOOD SUGAR 2-3 TIMES WEEKLY. Hali Marry, MD Taking Active   hydrochlorothiazide (HYDRODIURIL) 25 MG  tablet [id number] Yes Take 1 tablet  by mouth daily. Hali Marry, MD Taking Active   hydrocortisone (ANUSOL-HC) 25 MG suppository [id number] Yes Place 25 mg rectally as needed. Reported on 04/19/2016 [provider] Taking Active   hydrocortisone 2.5 % cream [id number] Yes Apply topically as needed. Reported on 04/19/2016 [provider] Taking Active   ipratropium (ATROVENT) 0.06 % nasal spray [id number] Yes Place 2 sprays into both nostrils every 4 (four) hours as needed for rhinitis. Gregor Hams, MD Taking Active   Iron-Folic Acid-Vit S97 (IRON FORMULA PO) [id number] Yes Take 1 Dose by mouth daily. In liquid form [provider] Taking Active   Lancets 30G MISC [id number] Yes DX DM E11.9 Check fasting blood sugar 2-3 times weekly. Hali Marry, MD Taking Active   losartan (COZAAR) 100 MG tablet [id number] Yes TAKE 1 TABLET BY MOUTH EVERY DAY Hali Marry, MD Taking Active   montelukast (SINGULAIR) 10 MG tablet [id number] Yes TAKE 1 TABLET BY MOUTH AT BEDTIME. Hali Marry, MD Taking Active   Multiple Vitamin (MULTIVITAMIN) tablet [id number] Yes Take 1 tablet by mouth daily. [provider] Taking Active   Naftifine HCl 2 % CREA [id number] Yes Apply 1 application topically 2 times a day Hali Marry, MD Taking Active   Omega-3 Fatty Acids (FISH OIL) 1000 MG CAPS [id number] Yes Take 1,000 mg by mouth daily. [provider] Taking Active   pantoprazole (PROTONIX) 40 MG tablet [id number] Yes Take 40 mg by mouth 2 (two) times daily. [provider] Taking Active   propranolol ER (INDERAL LA) 60 MG 24 hr capsule [id number] Yes Take 1 capsule (60 mg total) by mouth daily. Pieter Partridge, DO Taking Active   rosuvastatin (CRESTOR) 10 MG tablet [id number] Yes TAKE 1 TABLET BY MOUTH EVERYDAY AT BEDTIME Hali Marry, MD Taking Active   sertraline (ZOLOFT) 100 MG tablet [id number] Yes Take 1 tablet by mouth daily.  [provider] Taking Active   tadalafil (CIALIS) 5 MG tablet [id number] Yes Take 5 mg by mouth daily. [provider] Taking Active   tamsulosin (FLOMAX) 0.4 MG CAPS capsule [id number] Yes Take 0.4 mg by mouth. [provider] Taking Active   tiotropium (SPIRIVA) 18 MCG inhalation capsule [id number] Yes Place 18 mcg into inhaler and inhale daily. [provider] Taking Active   topiramate (TOPAMAX) 50 MG tablet [id number] Yes TAKE 1 TABLET BY MOUTH TWICE A DAY Hali Marry, MD Taking Active   TRAZODONE HCL PO [id number] Yes Take 100 mg by mouth daily. [provider] Taking Active   triamcinolone ointment (KENALOG) 0.1 % [id number] Yes  [provider] Taking Active   valACYclovir (VALTREX) 500 MG tablet [id number] Yes TAKE 1 TABLET BY MOUTH EVERY DAY Hali Marry, MD Taking Active             Patient Active Problem List   Diagnosis Date Noted   Tingling in extremities 10/04/2020   Tinnitus of both ears 10/04/2020   Deviated septum 10/04/2020   Low ferritin 08/04/2019   Delta beta thalassemia ([place]) 04/16/2019   Asthma 03/10/2018   Primary osteoarthritis of right hand 01/30/2018   Migraine with aura and with status migrainosus, not intractable 11/21/2017   Recurrent genital herpes simplex 75/64/3329   Eosinophilic esophagitis 51/88/4166   Lumbar spondylosis with myelopathy 06/14/2017   OSA on CPAP 05/16/2017   Hearing loss 01/31/2017   Pulmonary nodule 01/31/2017   Kidney lesion, native, left 01/31/2017   Depression, recurrent ([place]) 01/31/2017   Chronic cough 04/19/2016   Insomnia 11/18/2014   Gout 12/03/2013   Onychomycosis 12/03/2013   B12 deficiency 12/03/2013   Unspecified vitamin D deficiency 12/03/2013   Liver fibrosis 12/03/2013   Essential hypertension, benign 10/29/2013   History of hepatitis C virus infection 10/29/2013   GERD (gastroesophageal reflux disease) 10/29/2013   Hyperlipidemia 10/29/2013    BPH (benign prostatic hyperplasia) 10/29/2013   ED (erectile dysfunction) 10/29/2013   Hemorrhoid 10/29/2013   HSV antigen DIF positive 11/27/2011   Sciatic neuropathy 08/10/2011   Pain of lumbosacral spine 08/10/2011   Hepatitis C antibody test positive 08/10/2011    Immunization History  Administered Date(s) Administered   Influenza Split 09/03/2018   Influenza, High Dose Seasonal PF 07/22/2020   Influenza,inj,Quad PF,6+ Mos 07/14/2014, 08/23/2015   Influenza-Unspecified 10/01/2011, 08/19/2012, 08/03/2013, 08/15/2016, 08/08/2017, 08/22/2018, 08/06/2019, 08/28/2021   PFIZER Comirnaty(Gray Top)Covid-19 Tri-Sucrose Vaccine 02/01/2021   PFIZER(Purple Top)SARS-COV-2 Vaccination 12/11/2019, 01/01/2020, 09/06/2020   Pneumococcal Conjugate-13 10/01/2016   Pneumococcal Polysaccharide-23 01/15/2012, 08/25/2015   Td (Adult) 12/12/2018   Tdap 10/22/2006, 11/18/2014   Zoster Recombinat (Shingrix) 04/18/2021, 06/22/2021   Zoster, Live 11/18/2014    Conditions to be addressed/monitored: HTN and HLD  There are no care plans that you recently modified to display for this patient.   Medication Assistance: None required.  Patient affirms current coverage meets needs.  Patient's preferred pharmacy is:  [id number] [place], [place] - [id number] [place] [address]Phone: [phone]: [phone] [address] [place] [place] [address]Phone: [phone]: [phone] [address] [address]Phone: [phone]: [address] [address]Phone: [phone]:  [phone] Uses pill box? No - pulls  out of bottles one by one Pt endorses 100% compliance  Follow Up:  Patient agrees to Care Plan and Follow-up.  Plan: Face to Face appointment with care management team member scheduled for: 1 month  Larinda Buttery, PharmD Clinical Pharmacist [facility] [phone]

## 2021-09-26 NOTE — Progress Notes (Deleted)
Chronic Care Management Pharmacy Note  09/26/2021 Name:  Brandon [name] MRN:  [id number] DOB:  15-Apr-1951  Summary: addressed HTN, HLD  Recommendations/Changes made from today's visit: none, pt to check BP at home and provide numbers at our next discussion. Also will implement diet changes for cholesterol improvement, recommend sooner than annual recheck (~12 weeks) and increase rosuvastatin dose if not at goal.  Plan: f/u with pharmacist in 1 month  Subjective: Brandon [name] is an 70 y.o. year old male who is a primary patient of Metheney, Rene Kocher, MD.  The CCM team was consulted for assistance with disease management and care coordination needs.    Engaged with patient by telephone for initial visit in response to provider referral for pharmacy case management and/or care coordination services.   Consent to Services:  The patient was given information about Chronic Care Management services, agreed to services, and gave verbal consent prior to initiation of services.  Please see initial visit note for detailed documentation.   Patient Care Team: Hali Marry, MD as PCP - General (Family Medicine) Hali Marry, MD as Consulting Physician (Family Medicine) [name], [facility] as Pharmacist (Pharmacist)  Recent office visits:  07/04/21 Posey Pronto MD - Seen for medicare annual wellness exam - Labs ordered - No medication changes noted - Follow up in one year    Recent consult visits:  07/05/21 Beverely Low PA - Ophthalmology - Seen for dry eye syndrome - No medication changes noted - Follow up in 26 weeks  06/28/21 Metta Clines DO - Neurology - Seen for migraine - No medication changes noted - Follow up in 6 months  06/22/21 [facility] - Seen for immunization - No medication changes noted - No follow up noted  06/16/21 Ophthalmology - Seen for [place] retinal tear - No medication changes noted - Follow up in 6 weeks  06/08/21 Nanetta Batty -  Encounter for preprocedural laboratory examination - No medication changes noted - No follow up noted  05/31/21 Beverely Low PA - Ophthalmology - Seen for dry eye syndrome  - No medication changes noted - Follow up in 6 weeks  05/18/21 - Beverely Low PA - Ophthalmology - Seen for initial visit - No medication changes noted - Follow up in 13 days 04/07/21 Ihor Dow MD - Urology - Seen for Benign prostatic hyperplasia with urinary obstruction - No medication changes noted - Follow up in 1 year      Hospital visits:  None in previous 6 months  Objective:  Lab Results  Component Value Date   CREATININE 1.12 07/11/2021   CREATININE 1.15 02/04/2020   CREATININE 1.2 12/14/2019    Lab Results  Component Value Date   HGBA1C 4.8 02/02/2020   Last diabetic Eye exam: No results found for: HMDIABEYEEXA  Last diabetic Foot exam: No results found for: HMDIABFOOTEX      Component Value Date/Time   CHOL 187 07/11/2021 0000   TRIG 260 (H) 07/11/2021 0000   HDL 32 (L) 07/11/2021 0000   CHOLHDL 5.8 (H) 07/11/2021 0000   VLDL 39 (H) 06/18/2016 0859   LDLCALC 118 (H) 07/11/2021 0000    Hepatic Function Latest Ref Rng & Units 07/11/2021 12/14/2019 08/04/2019  Total Protein 6.1 - 8.1 g/dL 6.9 - 7.4  Albumin 3.5 - 5.0 - 3.9 -  AST 10 - 35 U/L 22 24 17   ALT 9 - 46 U/L 27 38 17  Alk Phosphatase 38 - 126 U/L - - -  Total Bilirubin 0.2 - 1.2 mg/dL 0.6 - 0.5  Bilirubin, Direct 0.01 - 0.4 - 0.1 -    Lab Results  Component Value Date/Time   TSH 1.76 03/19/2019 07:49 AM   TSH 0.790 10/29/2013 11:26 AM    CBC Latest Ref Rng & Units 07/11/2021 12/14/2019 04/16/2019  WBC 3.8 - 10.8 Thousand/uL 6.1 7.5 6.8  Hemoglobin 13.2 - 17.1 g/dL 16.1 16.4 14.2  Hematocrit 38.5 - 50.0 % 48.8 49 44.1  Platelets 140 - 400 Thousand/uL 150 - 160    Lab Results  Component Value Date/Time   VD25OH 25.2 02/28/2012 12:00 AM    Clinical ASCVD:  The 10-year ASCVD risk score (Arnett DK, et al., 2019) is: 59.7%    Values used to calculate the score:     Age: 30 years     Sex: Male     Is Non-Hispanic African American: Yes     Diabetic: Yes     Tobacco smoker: Yes     Systolic Blood Pressure: 563 mmHg     Is BP treated: Yes     HDL Cholesterol: 32 mg/dL     Total Cholesterol: 187 mg/dL     Social History   Tobacco Use  Smoking Status Former   Packs/day: 1.50   Years: 15.00   Pack years: 22.50   Types: Cigarettes   Quit date: 10/22/1986   Years since quitting: 34.9  Smokeless Tobacco Never  Tobacco Comments   SmokE off and on   BP Readings from Last 3 Encounters:  07/04/21 136/64  06/28/21 136/80  12/21/20 130/74   Pulse Readings from Last 3 Encounters:  07/04/21 87  06/28/21 66  12/21/20 77   Wt Readings from Last 3 Encounters:  07/04/21 197 lb (89.4 kg)  06/28/21 197 lb (89.4 kg)  12/21/20 197 lb 6.4 oz (89.5 kg)    Assessment: Review of patient past medical history, allergies, medications, health status, including review of consultants reports, laboratory and other test data, was performed as part of comprehensive evaluation and provision of chronic care management services.   SDOH:  (Social Determinants of Health) assessments and interventions performed:    CCM Care Plan  Allergies  Allergen Reactions   Amlodipine Rash   Atorvastatin Other (See Comments)    Myalgias    Medications Reviewed Today     Reviewed by [name], [facility] (Pharmacist) on 08/24/21 at [place] List Status: <None>   Medication Order Taking? Sig Documenting Provider Last Dose Status Informant  albuterol (PROAIR HFA) 108 (90 Base) MCG/ACT inhaler [id number] Yes Inhale 2 puffs into the lungs every 6 (six) hours as needed for wheezing or shortness of breath. Hali Marry, MD Taking Active   Blood Glucose Monitoring Suppl (ONE TOUCH ULTRA 2) w/Device KIT [id number] Yes DX DM E11.9 Check fasting blood sugar 2-3 times weekly. Hali Marry, MD Taking Active   cetirizine (ZYRTEC) 10  MG tablet [id number] Yes TAKE 1 TABLET BY MOUTH EVERY DAY Emeterio Reeve, DO Taking Active   Cholecalciferol (VITAMIN D3 PO) [id number] Yes Take 1 tablet by mouth daily. [provider] Taking Active   CINNAMON PO [id number] No Take 2 capsules by mouth daily.  Patient not taking: Reported on 08/24/2021   [provider] Not Taking Active   clindamycin (CLEOCIN T) 1 % lotion [id number] Yes Apply topically 2 (two) times daily. [provider] Taking Active   clindamycin (CLEOCIN T) 1 % SWAB [id number]   [provider]  Active  Med Note ([facility], HEATHER C   Mon Mar 10, 2018  3:11 PM)    clotrimazole-betamethasone Donalynn Furlong) cream [id number] Yes Apply topically 2 (two) times daily. Hali Marry, MD Taking Active   cyclobenzaprine (FLEXERIL) 10 MG tablet [id number] Yes Take 10 mg by mouth 3 (three) times daily as needed. [provider] Taking Active   diclofenac sodium (VOLTAREN) 1 % GEL [id number] Yes Apply 2 g topically 4 (four) times daily. To affected areas Hali Marry, MD Taking Active   fluticasone [facility]) 50 MCG/ACT nasal spray [id number] Yes Place 2 sprays into both nostrils daily. Gregor Hams, MD Taking Active   glucose blood [facility] ULTRA) test strip [id number] Yes CHECK FASTING BLOOD SUGAR 2-3 TIMES WEEKLY. Hali Marry, MD Taking Active   hydrochlorothiazide (HYDRODIURIL) 25 MG tablet [id number] Yes Take 1 tablet  by mouth daily. Hali Marry, MD Taking Active   hydrocortisone (ANUSOL-HC) 25 MG suppository [id number] Yes Place 25 mg rectally as needed. Reported on 04/19/2016 [provider] Taking Active   hydrocortisone 2.5 % cream [id number] Yes Apply topically as needed. Reported on 04/19/2016 [provider] Taking Active   ipratropium (ATROVENT) 0.06 % nasal spray [id number] Yes Place 2 sprays into both nostrils every 4 (four) hours as needed for rhinitis. Gregor Hams, MD Taking  Active   Iron-Folic Acid-Vit L37 (IRON FORMULA PO) [id number] Yes Take 1 Dose by mouth daily. In liquid form [provider] Taking Active   Lancets 30G MISC [id number] Yes DX DM E11.9 Check fasting blood sugar 2-3 times weekly. Hali Marry, MD Taking Active   losartan (COZAAR) 100 MG tablet [id number] Yes TAKE 1 TABLET BY MOUTH EVERY DAY Hali Marry, MD Taking Active   montelukast (SINGULAIR) 10 MG tablet [id number] Yes TAKE 1 TABLET BY MOUTH AT BEDTIME. Hali Marry, MD Taking Active   Multiple Vitamin (MULTIVITAMIN) tablet [id number] Yes Take 1 tablet by mouth daily. [provider] Taking Active   Naftifine HCl 2 % CREA [id number] Yes Apply 1 application topically 2 times a day Hali Marry, MD Taking Active   Omega-3 Fatty Acids (FISH OIL) 1000 MG CAPS [id number] Yes Take 1,000 mg by mouth daily. [provider] Taking Active   pantoprazole (PROTONIX) 40 MG tablet [id number] Yes Take 40 mg by mouth 2 (two) times daily. [provider] Taking Active   propranolol ER (INDERAL LA) 60 MG 24 hr capsule [id number] Yes Take 1 capsule (60 mg total) by mouth daily. Pieter Partridge, DO Taking Active   rosuvastatin (CRESTOR) 10 MG tablet [id number] Yes TAKE 1 TABLET BY MOUTH EVERYDAY AT BEDTIME Hali Marry, MD Taking Active   sertraline (ZOLOFT) 100 MG tablet [id number] Yes Take 1 tablet by mouth daily. [provider] Taking Active   tadalafil (CIALIS) 5 MG tablet [id number] Yes Take 5 mg by mouth daily. [provider] Taking Active   tamsulosin (FLOMAX) 0.4 MG CAPS capsule [id number] Yes Take 0.4 mg by mouth. [provider] Taking Active   tiotropium (SPIRIVA) 18 MCG inhalation capsule [id number] Yes Place 18 mcg into inhaler and inhale daily. [provider] Taking Active   topiramate (TOPAMAX) 50 MG tablet [id number] Yes TAKE 1 TABLET BY MOUTH TWICE A DAY Hali Marry, MD Taking Active    TRAZODONE HCL PO [id number] Yes Take 100 mg by mouth daily. [provider] Taking Active   triamcinolone ointment (KENALOG) 0.1 % [id number] Yes  [provider] Taking Active  valACYclovir (VALTREX) 500 MG tablet [id number] Yes TAKE 1 TABLET BY MOUTH EVERY DAY Hali Marry, MD Taking Active             Patient Active Problem List   Diagnosis Date Noted   Tingling in extremities 10/04/2020   Tinnitus of both ears 10/04/2020   Deviated septum 10/04/2020   Low ferritin 08/04/2019   Delta beta thalassemia ([place]) 04/16/2019   Asthma 03/10/2018   Primary osteoarthritis of right hand 01/30/2018   Migraine with aura and with status migrainosus, not intractable 11/21/2017   Recurrent genital herpes simplex 41/12/129   Eosinophilic esophagitis 43/88/8757   Lumbar spondylosis with myelopathy 06/14/2017   OSA on CPAP 05/16/2017   Hearing loss 01/31/2017   Pulmonary nodule 01/31/2017   Kidney lesion, native, left 01/31/2017   Depression, recurrent ([place]) 01/31/2017   Chronic cough 04/19/2016   Insomnia 11/18/2014   Gout 12/03/2013   Onychomycosis 12/03/2013   B12 deficiency 12/03/2013   Unspecified vitamin D deficiency 12/03/2013   Liver fibrosis 12/03/2013   Essential hypertension, benign 10/29/2013   History of hepatitis C virus infection 10/29/2013   GERD (gastroesophageal reflux disease) 10/29/2013   Hyperlipidemia 10/29/2013   BPH (benign prostatic hyperplasia) 10/29/2013   ED (erectile dysfunction) 10/29/2013   Hemorrhoid 10/29/2013   HSV antigen DIF positive 11/27/2011   Sciatic neuropathy 08/10/2011   Pain of lumbosacral spine 08/10/2011   Hepatitis C antibody test positive 08/10/2011    Immunization History  Administered Date(s) Administered   Influenza Split 09/03/2018   Influenza, High Dose Seasonal PF 07/22/2020   Influenza,inj,Quad PF,6+ Mos 07/14/2014, 08/23/2015   Influenza-Unspecified 10/01/2011, 08/19/2012, 08/03/2013, 08/15/2016,  08/08/2017, 08/22/2018, 08/06/2019, 08/28/2021   PFIZER Comirnaty(Gray Top)Covid-19 Tri-Sucrose Vaccine 02/01/2021   PFIZER(Purple Top)SARS-COV-2 Vaccination 12/11/2019, 01/01/2020, 09/06/2020   Pneumococcal Conjugate-13 10/01/2016   Pneumococcal Polysaccharide-23 01/15/2012, 08/25/2015   Td (Adult) 12/12/2018   Tdap 10/22/2006, 11/18/2014   Zoster Recombinat (Shingrix) 04/18/2021, 06/22/2021   Zoster, Live 11/18/2014    Conditions to be addressed/monitored: HTN and HLD  There are no care plans that you recently modified to display for this patient.   Medication Assistance: None required.  Patient affirms current coverage meets needs.  Patient's preferred pharmacy is:  [id number] [place], [place] - [address] [address]Phone: [phone]: [phone] [place] Center[place] [address]Phone: [phone]: [phone] WE[address] [place] [address]Phone: [phone]: [address] [address]PS[address] Zanesville[place]Phone: [phone]: [phone] Uses pill box? No - pulls out of bottles one by one Pt endorses 100% compliance  Follow Up:  Patient agrees to Care Plan and Follow-up.  Plan: Telephone follow up appointment with care management team member scheduled for:  1 month  KLarinda Buttery PharmD Clinical Pharmacist CEnglewood Community HospitalPrimary Care At MCarolina Regional Surgery Center [phone]

## 2021-09-27 MED ORDER — ROSUVASTATIN CALCIUM 20 MG PO TABS: 20.0000 mg | ORAL_TABLET | Freq: Every day | ORAL | 3 refills | Status: DC

## 2021-09-27 NOTE — Addendum Note (Signed)
Addended by: Beatrice Lecher D on: 09/27/2021 03:40 PM   Modules accepted: Orders

## 2021-10-05 ENCOUNTER — Other Ambulatory Visit: Payer: Self-pay | Admitting: *Deleted

## 2021-10-05 MED ORDER — ROSUVASTATIN CALCIUM 20 MG PO TABS: 20.0000 mg | ORAL_TABLET | Freq: Every day | ORAL | 3 refills | Status: AC

## 2021-10-21 DIAGNOSIS — I10 Essential (primary) hypertension: Secondary | ICD-10-CM

## 2021-10-21 DIAGNOSIS — E785 Hyperlipidemia, unspecified: Secondary | ICD-10-CM | POA: Diagnosis not present

## 2021-10-22 ENCOUNTER — Other Ambulatory Visit: Payer: Self-pay | Admitting: Family Medicine

## 2021-10-22 DIAGNOSIS — G43701 Chronic migraine without aura, not intractable, with status migrainosus: Secondary | ICD-10-CM

## 2021-11-01 ENCOUNTER — Ambulatory Visit (INDEPENDENT_AMBULATORY_CARE_PROVIDER_SITE_OTHER): Payer: Medicare Other | Admitting: Pharmacist

## 2021-11-01 ENCOUNTER — Other Ambulatory Visit: Payer: Self-pay

## 2021-11-01 VITALS — BP 138/82 | HR 73

## 2021-11-01 DIAGNOSIS — E785 Hyperlipidemia, unspecified: Secondary | ICD-10-CM

## 2021-11-01 DIAGNOSIS — I10 Essential (primary) hypertension: Secondary | ICD-10-CM

## 2021-11-01 NOTE — Progress Notes (Signed)
Chronic Care Management Pharmacy Note  11/01/2021 Name:  Brandon [name] MRN:  [id number] DOB:  [birth year]  Summary: addressed HTN, HLD. Patient expressed a goal of eliminating or reducing dosage of blood pressure medications. He states he wants to really fine-tune diet and exercise to achieve this. We discussed details of current nutrition & activity level as well as recommendations.   BP readings at home:  SBP 150s in morning, and SBP 120s after medicine   States he previously walked and weight-lifted 3x per week.  Recommendations/Changes made from today's visit:   No medication changes, provided medicine education, lifestyle modifications, and a plan to improve hydration, diet, and exercise.  Patient to continue checking BP readings at home & document for future visits.  Plan: f/u with pharmacist in 1 month  Subjective: Brandon [name] is an 70 y.o. year old male who is a primary patient of Metheney, Rene Kocher, MD.  The CCM team was consulted for assistance with disease management and care coordination needs.    Engaged with patient face to face for follow up visit in response to provider referral for pharmacy case management and/or care coordination services.   Consent to Services:  The patient was given information about Chronic Care Management services, agreed to services, and gave verbal consent prior to initiation of services.  Please see initial visit note for detailed documentation.   Patient Care Team: Hali Marry, MD as PCP - General (Family Medicine) Hali Marry, MD as Consulting Physician (Family Medicine) [name], [facility] as Pharmacist (Pharmacist)  Recent office visits:  07/04/21 Posey Pronto MD - Seen for medicare annual wellness exam - Labs ordered - No medication changes noted - Follow up in one year    Recent consult visits:  07/05/21 Beverely Low PA - Ophthalmology - Seen for dry eye syndrome - No medication changes noted  - Follow up in 26 weeks  06/28/21 Metta Clines DO - Neurology - Seen for migraine - No medication changes noted - Follow up in 6 months  06/22/21 [facility] - Seen for immunization - No medication changes noted - No follow up noted  06/16/21 Ophthalmology - Seen for [place]orses[place] retinal tear - No medication changes noted - Follow up in 6 weeks  06/08/21 Nanetta Batty - Encounter for preprocedural laboratory examination - No medication changes noted - No follow up noted  05/31/21 Beverely Low PA - Ophthalmology - Seen for dry eye syndrome  - No medication changes noted - Follow up in 6 weeks  05/18/21 - Beverely Low PA - Ophthalmology - Seen for initial visit - No medication changes noted - Follow up in 13 days 04/07/21 Ihor Dow MD - Urology - Seen for Benign prostatic hyperplasia with urinary obstruction - No medication changes noted - Follow up in 1 year      Hospital visits:  None in previous 6 months  Objective:  Lab Results  Component Value Date   CREATININE 1.12 07/11/2021   CREATININE 1.15 02/04/2020   CREATININE 1.2 12/14/2019    Lab Results  Component Value Date   HGBA1C 4.8 02/02/2020   Last diabetic Eye exam: No results found for: HMDIABEYEEXA  Last diabetic Foot exam: No results found for: HMDIABFOOTEX      Component Value Date/Time   CHOL 187 07/11/2021 0000   TRIG 260 (H) 07/11/2021 0000   HDL 32 (L) 07/11/2021 0000   CHOLHDL 5.8 (H) 07/11/2021 0000   VLDL 39 (H) 06/18/2016  0859   LDLCALC 118 (H) 07/11/2021 0000    Hepatic Function Latest Ref Rng & Units 07/11/2021 12/14/2019 08/04/2019  Total Protein 6.1 - 8.1 g/dL 6.9 - 7.4  Albumin 3.5 - 5.0 - 3.9 -  AST 10 - 35 U/L 22 24 17   ALT 9 - 46 U/L 27 38 17  Alk Phosphatase 38 - 126 U/L - - -  Total Bilirubin 0.2 - 1.2 mg/dL 0.6 - 0.5  Bilirubin, Direct 0.01 - 0.4 - 0.1 -    Lab Results  Component Value Date/Time   TSH 1.76 03/19/2019 07:49 AM   TSH 0.790 10/29/2013 11:26 AM    CBC Latest Ref Rng & Units  07/11/2021 12/14/2019 04/16/2019  WBC 3.8 - 10.8 Thousand/uL 6.1 7.5 6.8  Hemoglobin 13.2 - 17.1 g/dL 16.1 16.4 14.2  Hematocrit 38.5 - 50.0 % 48.8 49 44.1  Platelets 140 - 400 Thousand/uL 150 - 160    Lab Results  Component Value Date/Time   VD25OH 25.2 02/28/2012 12:00 AM    Clinical ASCVD:  The 10-year ASCVD risk score (Arnett DK, et al., 2019) is: 59.7%   Values used to calculate the score:     Age: 56 years     Sex: Male     Is Non-Hispanic African American: Yes     Diabetic: Yes     Tobacco smoker: Yes     Systolic Blood Pressure: 989 mmHg     Is BP treated: Yes     HDL Cholesterol: 32 mg/dL     Total Cholesterol: 187 mg/dL     Social History   Tobacco Use  Smoking Status Former   Packs/day: 1.50   Years: 15.00   Pack years: 22.50   Types: Cigarettes   Quit date: 10/22/1986   Years since quitting: 35.0  Smokeless Tobacco Never  Tobacco Comments   SmokE off and on   BP Readings from Last 3 Encounters:  07/04/21 136/64  06/28/21 136/80  12/21/20 130/74   Pulse Readings from Last 3 Encounters:  07/04/21 87  06/28/21 66  12/21/20 77   Wt Readings from Last 3 Encounters:  07/04/21 197 lb (89.4 kg)  06/28/21 197 lb (89.4 kg)  12/21/20 197 lb 6.4 oz (89.5 kg)    Assessment: Review of patient past medical history, allergies, medications, health status, including review of consultants reports, laboratory and other test data, was performed as part of comprehensive evaluation and provision of chronic care management services.   SDOH:  (Social Determinants of Health) assessments and interventions performed:    CCM Care Plan  Allergies  Allergen Reactions   Amlodipine Rash   Atorvastatin Other (See Comments)    Myalgias    Medications Reviewed Today     Reviewed by [name], [facility] (Pharmacist) on 09/26/21 at 1416  Med List Status: <None>   Medication Order Taking? Sig Documenting Provider Last Dose Status Informant  albuterol (PROAIR HFA) 108 (90  Base) MCG/ACT inhaler [id number] Yes Inhale 2 puffs into the lungs every 6 (six) hours as needed for wheezing or shortness of breath. Hali Marry, MD Taking Active   Alum Hydroxide-Mag Carbonate (ACID GONE ANTACID PO) [id number] Yes Take 1 tablet by mouth daily as needed. [provider] Taking Active   Black Elderberry 50 MG/5ML SYRP [id number] Yes Take 1 Dose by mouth at bedtime. [provider] Taking Active   Blood Glucose Monitoring Suppl (ONE TOUCH ULTRA 2) w/Device KIT [id number] Yes DX DM E11.9 Check fasting blood sugar  2-3 times weekly. Hali Marry, MD Taking Active   carboxymethylcellulose (REFRESH PLUS) 0.5 % SOLN [id number] Yes 1 drop 3 (three) times daily as needed. [provider] Taking Active   cetirizine (ZYRTEC) 10 MG tablet [id number] Yes TAKE 1 TABLET BY MOUTH EVERY DAY Emeterio Reeve, DO Taking Active   Cholecalciferol (VITAMIN D3 PO) [id number] Yes Take 1 tablet by mouth daily. [provider] Taking Active   CINNAMON PO [id number] No Take 2 capsules by mouth daily.  Patient not taking: Reported on 09/26/2021   [provider] Not Taking Active   clindamycin (CLEOCIN T) 1 % lotion [id number] Yes Apply topically 2 (two) times daily. [provider] Taking Active   clindamycin (CLEOCIN T) 1 % SWAB [id number] Yes  [provider] Taking Active            Med Note (CO[facility], HEATHER C   Mon Mar 10, 2018  3:11 PM)    clotrimazole-betamethasone (LOTRISONE) cream [id number] Yes Apply topically 2 (two) times daily. Hali Marry, MD Taking Active   cyclobenzaprine (FLEXERIL) 10 MG tablet [id number] Yes Take 10 mg by mouth 3 (three) times daily as needed. [provider] Taking Active   diclofenac sodium (VOLTAREN) 1 % GEL [id number] Yes Apply 2 g topically 4 (four) times daily. To affected areas Hali Marry, MD Taking Active   fluticasone [facility]) 50 MCG/ACT nasal spray [id number] Yes Place 2  sprays into both nostrils daily. Gregor Hams, MD Taking Active   glucose blood [facility] ULTRA) test strip [id number] Yes CHECK FASTING BLOOD SUGAR 2-3 TIMES WEEKLY. Hali Marry, MD Taking Active   guaifenesin (HUMIBID E) 400 MG TABS tablet [id number] Yes Take 400 mg by mouth every 6 (six) hours as needed. [provider] Taking Active   hydrochlorothiazide (HYDRODIURIL) 25 MG tablet [id number] Yes Take 1 tablet  by mouth daily. Hali Marry, MD Taking Active   hydrocortisone (ANUSOL-HC) 25 MG suppository [id number] Yes Place 25 mg rectally as needed. Reported on 04/19/2016 [provider] Taking Active   hydrocortisone 2.5 % cream [id number] Yes Apply topically as needed. Reported on 04/19/2016 [provider] Taking Active   ipratropium (ATROVENT) 0.06 % nasal spray [id number] Yes Place 2 sprays into both nostrils every 4 (four) hours as needed for rhinitis. Gregor Hams, MD Taking Active   Iron-Folic Acid-Vit E08 (IRON FORMULA PO) [id number] Yes Take 1 Dose by mouth daily. In liquid form [provider] Taking Active   Lancets 30G MISC [id number] Yes DX DM E11.9 Check fasting blood sugar 2-3 times weekly. Hali Marry, MD Taking Active   losartan (COZAAR) 100 MG tablet [id number] Yes TAKE 1 TABLET BY MOUTH EVERY DAY Hali Marry, MD Taking Active   montelukast (SINGULAIR) 10 MG tablet [id number] Yes TAKE 1 TABLET BY MOUTH AT BEDTIME. Hali Marry, MD Taking Active   Multiple Vitamin (MULTIVITAMIN) tablet [id number] Yes Take 1 tablet by mouth daily. [provider] Taking Active   Naftifine HCl 2 % CREA [id number] Yes Apply 1 application topically 2 times a day Hali Marry, MD Taking Active   Omega-3 Fatty Acids (FISH OIL) 1000 MG CAPS [id number] Yes Take 1,000 mg by mouth daily. [provider] Taking Active   pantoprazole (PROTONIX) 40 MG tablet [id number] Yes Take 40 mg by mouth 2 (two) times daily.  [provider] Taking Active   polycarbophil (FIBERCON) 625 MG tablet [id number] Yes Take 625 mg by mouth daily. [provider]  Taking Active   polyethylene glycol (MIRALAX / GLYCOLAX) 17 g packet [id number] Yes Take 17 g by mouth daily. [provider] Taking Active   propranolol ER (INDERAL LA) 60 MG 24 hr capsule [id number] Yes Take 1 capsule (60 mg total) by mouth daily. Pieter Partridge, DO Taking Active   Propylene Glycol (SYSTANE BALANCE) 0.6 % SOLN [id number] Yes Apply 1 drop to eye daily. [provider] Taking Active   rosuvastatin (CRESTOR) 10 MG tablet [id number] Yes TAKE 1 TABLET BY MOUTH EVERYDAY AT BEDTIME Hali Marry, MD Taking Active   sertraline (ZOLOFT) 100 MG tablet [id number] Yes Take 1 tablet by mouth daily. [provider] Taking Active   tadalafil (CIALIS) 5 MG tablet [id number] Yes Take 5 mg by mouth daily. [provider] Taking Active   tamsulosin (FLOMAX) 0.4 MG CAPS capsule [id number] Yes Take 0.4 mg by mouth. [provider] Taking Active   tiotropium (SPIRIVA) 18 MCG inhalation capsule [id number] Yes Place 18 mcg into inhaler and inhale daily. [provider] Taking Active   topiramate (TOPAMAX) 50 MG tablet [id number] Yes TAKE 1 TABLET BY MOUTH TWICE A DAY Hali Marry, MD Taking Active   TRAZODONE HCL PO [id number] Yes Take 100 mg by mouth daily. [provider] Taking Active   triamcinolone ointment (KENALOG) 0.1 % [id number] Yes  [provider] Taking Active   valACYclovir (VALTREX) 500 MG tablet [id number] Yes TAKE 1 TABLET BY MOUTH EVERY DAY Hali Marry, MD Taking Active             Patient Active Problem List   Diagnosis Date Noted   Tingling in extremities 10/04/2020   Tinnitus of both ears 10/04/2020   Deviated septum 10/04/2020   Low ferritin 08/04/2019   Delta beta thalassemia ([place]) 04/16/2019   Asthma 03/10/2018   Primary osteoarthritis of  right hand 01/30/2018   Migraine with aura and with status migrainosus, not intractable 11/21/2017   Recurrent genital herpes simplex 41/96/2229   Eosinophilic esophagitis 79/89/2119   Lumbar spondylosis with myelopathy 06/14/2017   OSA on CPAP 05/16/2017   Hearing loss 01/31/2017   Pulmonary nodule 01/31/2017   Kidney lesion, native, left 01/31/2017   Depression, recurrent ([place]) 01/31/2017   Chronic cough 04/19/2016   Insomnia 11/18/2014   Gout 12/03/2013   Onychomycosis 12/03/2013   B12 deficiency 12/03/2013   Unspecified vitamin D deficiency 12/03/2013   Liver fibrosis 12/03/2013   Essential hypertension, benign 10/29/2013   History of hepatitis C virus infection 10/29/2013   GERD (gastroesophageal reflux disease) 10/29/2013   Hyperlipidemia 10/29/2013   BPH (benign prostatic hyperplasia) 10/29/2013   ED (erectile dysfunction) 10/29/2013   Hemorrhoid 10/29/2013   HSV antigen DIF positive 11/27/2011   Sciatic neuropathy 08/10/2011   Pain of lumbosacral spine 08/10/2011   Hepatitis C antibody test positive 08/10/2011    Immunization History  Administered Date(s) Administered   Influenza Split 09/03/2018   Influenza, High Dose Seasonal PF 07/22/2020   Influenza,inj,Quad PF,6+ Mos 07/14/2014, 08/23/2015   Influenza-Unspecified 10/01/2011, 08/19/2012, 08/03/2013, 08/15/2016, 08/08/2017, 08/22/2018, 08/06/2019, 08/28/2021   PFIZER Comirnaty(Gray Top)Covid-19 Tri-Sucrose Vaccine 02/01/2021   PFIZER(Purple Top)SARS-COV-2 Vaccination 12/11/2019, 01/01/2020, 09/06/2020   Pneumococcal Conjugate-13 10/01/2016   Pneumococcal Polysaccharide-23 01/15/2012, 08/25/2015   Td (Adult) 12/12/2018   Tdap 10/22/2006, 11/18/2014   Zoster Recombinat (Shingrix) 04/18/2021, 06/22/2021   Zoster, Live 11/18/2014    Conditions to be addressed/monitored: HTN and HLD  There are no care plans that you recently modified to display for this  patient.    Medication Assistance: None required.   Patient affirms current coverage meets needs.  Patient's preferred pharmacy is:  [id number] [place], [place] - [address] [address]Phone: [phone]: [phone] [address] [place] [address]Phone: [phone]: [phone] [place] [place] [address]Phone: [phone]: [address]Phone: [phone]: [phone]  Uses pill box? No - pulls out of bottles one by one Pt endorses 100% compliance  Follow Up:  Patient agrees to Care Plan and Follow-up.  Plan: Face to Face appointment with care management team member scheduled for: 1 month  KLarinda Buttery PharmD Clinical Pharmacist CBlue [facility]Primary [phone]

## 2021-11-01 NOTE — Patient Instructions (Signed)
Visit Information  Thank you for taking time to visit with me today. Please don't hesitate to contact me if I can be of assistance to you before our next scheduled telephone appointment.  Following are the goals we discussed today:  Patient Goals/Self-Care Activities Over the next 30 days, patient will:  - take medications as prescribed and check blood pressure 2-3x per week, document, and provide at future appointments - implement DASH diet, increase physical activity in order to progress toward goal of reducing blood pressure medications  Follow Up Plan: Face to face follow up appointment with care management team member scheduled for:  1 month   Please call the care guide team at [phone] if you need to cancel or reschedule your appointment.   If you are experiencing a Mental Health or [place] or need someone to talk to, please call the Suicide and Crisis Lifeline: 988 call 1-800-273-TALK (toll free, 24 hour hotline)   Patient verbalizes understanding of instructions provided today and agrees to view in [place].   Brandon [name]   DASH Eating Plan DASH stands for Dietary Approaches to Stop Hypertension. The DASH eating plan is a healthy eating plan that has been shown to: Reduce high blood pressure (hypertension). Reduce your risk for type 2 diabetes, heart disease, and stroke. Help with weight loss. What are tips for following this plan? Reading food labels Check food labels for the amount of salt (sodium) per serving. Choose foods with less than 5 percent of the Daily Value of sodium. Generally, foods with less than 300 milligrams (mg) of sodium per serving fit into this eating plan. To find whole grains, look for the word "whole" as the first word in the ingredient list. Shopping Buy products labeled as "low-sodium" or "no salt added." Buy fresh foods. Avoid canned foods and pre-made or frozen meals. Cooking Avoid adding salt when cooking. Use salt-free  seasonings or herbs instead of table salt or sea salt. Check with your health care provider or pharmacist before using salt substitutes. Do not fry foods. Cook foods using healthy methods such as baking, boiling, grilling, roasting, and broiling instead. Cook with heart-healthy oils, such as olive, canola, avocado, soybean, or sunflower oil. Meal planning  Eat a balanced diet that includes: 4 or more servings of fruits and 4 or more servings of vegetables each day. Try to fill one-half of your plate with fruits and vegetables. 6-8 servings of whole grains each day. Less than 6 oz (170 g) of lean meat, poultry, or fish each day. A 3-oz (85-g) serving of meat is about the same size as a deck of cards. One egg equals 1 oz (28 g). 2-3 servings of low-fat dairy each day. One serving is 1 cup (237 mL). 1 serving of nuts, seeds, or beans 5 times each week. 2-3 servings of heart-healthy fats. Healthy fats called omega-3 fatty acids are found in foods such as walnuts, flaxseeds, fortified milks, and eggs. These fats are also found in cold-water fish, such as sardines, salmon, and mackerel. Limit how much you eat of: Canned or prepackaged foods. Food that is high in trans fat, such as some fried foods. Food that is high in saturated fat, such as fatty meat. Desserts and other sweets, sugary drinks, and other foods with added sugar. Full-fat dairy products. Do not salt foods before eating. Do not eat more than 4 egg yolks a week. Try to eat at least 2 vegetarian meals a week. Eat more home-cooked food and  less restaurant, buffet, and fast food. Lifestyle When eating at a restaurant, ask that your food be prepared with less salt or no salt, if possible. If you drink alcohol: Limit how much you use to: 0-1 drink a day for women who are not pregnant. 0-2 drinks a day for men. Be aware of how much alcohol is in your drink. In the [place] one drink equals one 12 oz bottle of beer (355 mL), one 5 oz glass  of wine (148 mL), or one 1 oz glass of hard liquor (44 mL). General information Avoid eating more than 2,300 mg of salt a day. If you have hypertension, you may need to reduce your sodium intake to 1,500 mg a day. Work with your health care provider to maintain a healthy body weight or to lose weight. Ask what an ideal weight is for you. Get at least 30 minutes of exercise that causes your heart to beat faster (aerobic exercise) most days of the week. Activities may include walking, swimming, or biking. Work with your health care provider or dietitian to adjust your eating plan to your individual calorie needs. What foods should I eat? Fruits All fresh, dried, or frozen fruit. Canned fruit in natural juice (without added sugar). Vegetables Fresh or frozen vegetables (raw, steamed, roasted, or grilled). Low-sodium or reduced-sodium tomato and vegetable juice. Low-sodium or reduced-sodium tomato sauce and tomato paste. Low-sodium or reduced-sodium canned vegetables. Grains Whole-grain or whole-wheat bread. Whole-grain or whole-wheat pasta. Brown rice. [name]. Bulgur. Whole-grain and low-sodium cereals. Pita bread. Low-fat, low-sodium crackers. Whole-wheat flour tortillas. Meats and other proteins Skinless chicken or [place]. Ground chicken or [place]. Pork with fat trimmed off. Fish and seafood. Egg whites. Dried beans, peas, or lentils. Unsalted nuts, nut butters, and seeds. Unsalted canned beans. Lean cuts of beef with fat trimmed off. Low-sodium, lean precooked or cured meat, such as sausages or meat loaves. Dairy Low-fat (1%) or fat-free (skim) milk. Reduced-fat, low-fat, or fat-free cheeses. Nonfat, low-sodium ricotta or cottage cheese. Low-fat or nonfat yogurt. Low-fat, low-sodium cheese. Fats and oils Soft margarine without trans fats. Vegetable oil. Reduced-fat, low-fat, or light mayonnaise and salad dressings (reduced-sodium). Canola, safflower, olive, avocado, soybean, and sunflower  oils. Avocado. Seasonings and condiments Herbs. Spices. Seasoning mixes without salt. Other foods Unsalted popcorn and pretzels. Fat-free sweets. The items listed above may not be a complete list of foods and beverages you can eat. Contact a dietitian for more information. What foods should I avoid? Fruits Canned fruit in a light or heavy syrup. Fried fruit. Fruit in cream or butter sauce. Vegetables Creamed or fried vegetables. Vegetables in a cheese sauce. Regular canned vegetables (not low-sodium or reduced-sodium). Regular canned tomato sauce and paste (not low-sodium or reduced-sodium). Regular tomato and vegetable juice (not low-sodium or reduced-sodium). Angie Fava. Olives. Grains Baked goods made with fat, such as croissants, muffins, or some breads. Dry pasta or rice meal packs. Meats and other proteins Fatty cuts of meat. Ribs. Fried meat. Berniece Salines. Bologna, salami, and other precooked or cured meats, such as sausages or meat loaves. Fat from the back of a pig (fatback). Bratwurst. Salted nuts and seeds. Canned beans with added salt. Canned or smoked fish. Whole eggs or egg yolks. Chicken or [place] with skin. Dairy Whole or 2% milk, cream, and half-and-half. Whole or full-fat cream cheese. Whole-fat or sweetened yogurt. Full-fat cheese. Nondairy creamers. Whipped toppings. Processed cheese and cheese spreads. Fats and oils Butter. Stick margarine. Lard. Shortening. Ghee. Bacon fat. Tropical oils, such as  coconut, palm kernel, or palm oil. Seasonings and condiments Onion salt, garlic salt, seasoned salt, table salt, and sea salt. Worcestershire sauce. Tartar sauce. Barbecue sauce. Teriyaki sauce. Soy sauce, including reduced-sodium. Steak sauce. Canned and packaged gravies. Fish sauce. Oyster sauce. Cocktail sauce. Store-bought horseradish. Ketchup. Mustard. Meat flavorings and tenderizers. Bouillon cubes. Hot sauces. Pre-made or packaged marinades. Pre-made or packaged taco seasonings.  Relishes. Regular salad dressings. Other foods Salted popcorn and pretzels. The items listed above may not be a complete list of foods and beverages you should avoid. Contact a dietitian for more information. Where to find more information National Heart, Lung, and Blood Institute: https://wilson-eaton.com/ American Heart Association: www.heart.org Academy of Nutrition and Dietetics: www.eatright.[place]: www.kidney.org Summary The DASH eating plan is a healthy eating plan that has been shown to reduce high blood pressure (hypertension). It may also reduce your risk for type 2 diabetes, heart disease, and stroke. When on the DASH eating plan, aim to eat more fresh fruits and vegetables, whole grains, lean proteins, low-fat dairy, and heart-healthy fats. With the DASH eating plan, you should limit salt (sodium) intake to 2,300 mg a day. If you have hypertension, you may need to reduce your sodium intake to 1,500 mg a day. Work with your health care provider or dietitian to adjust your eating plan to your individual calorie needs. This information is not intended to replace advice given to you by your health care provider. Make sure you discuss any questions you have with your health care provider. Document Revised: 09/11/2019 Document Reviewed: 09/11/2019 Elsevier Patient Education  2022 [employer].

## 2021-11-17 DIAGNOSIS — N419 Inflammatory disease of prostate, unspecified: Secondary | ICD-10-CM | POA: Diagnosis not present

## 2021-12-06 ENCOUNTER — Other Ambulatory Visit: Payer: Self-pay

## 2021-12-06 ENCOUNTER — Ambulatory Visit (INDEPENDENT_AMBULATORY_CARE_PROVIDER_SITE_OTHER): Payer: Medicare Other | Admitting: Pharmacist

## 2021-12-06 VITALS — BP 137/78 | HR 68

## 2021-12-06 DIAGNOSIS — E785 Hyperlipidemia, unspecified: Secondary | ICD-10-CM | POA: Diagnosis not present

## 2021-12-06 DIAGNOSIS — I10 Essential (primary) hypertension: Secondary | ICD-10-CM

## 2021-12-06 NOTE — Progress Notes (Signed)
Chronic Care Management Pharmacy Note  12/07/2021 Name:  Brandon [name] MRN:  [id number] DOB:  [birth year]  Summary: addressed HTN, HLD. Patient expresses a goal of eliminating or reducing dosage of blood pressure medications. He states he wants to really fine-tune diet and exercise to achieve this. We discussed details of current nutrition & activity level as well as recommendations.   BP readings at home:  130s/70s, highest 136/73 Previously was SBP 150s in morning, and SBP 120s after medicine.  Recommendations/Changes made from today's visit:  No medication changes, though he is definitely making improvements! Continue great work with nutrition & exercise, and if blood pressure reaches systolic 182X consistently, we discussed maybe we could trial decrease/discontinue of blood pressure medication. Would reduce HCTZ first.  Patient to continue checking BP readings at home & document for future visits.  Plan: f/u with pharmacist in 3-5 months  Subjective: Brandon [name] is an 71 y.o. year old male who is a primary patient of Metheney, Rene Kocher, MD.  The CCM team was consulted for assistance with disease management and care coordination needs.    Engaged with patient face to face for follow up visit in response to provider referral for pharmacy case management and/or care coordination services.   Consent to Services:  The patient was given information about Chronic Care Management services, agreed to services, and gave verbal consent prior to initiation of services.  Please see initial visit note for detailed documentation.   Patient Care Team: Hali Marry, MD as PCP - General (Family Medicine) Hali Marry, MD as Consulting Physician (Family Medicine) [name], [facility] as Pharmacist (Pharmacist)  Recent office visits:  07/04/21 Posey Pronto MD - Seen for medicare annual wellness exam - Labs ordered - No medication changes noted - Follow up in  one year    Recent consult visits:  07/05/21 Beverely Low PA - Ophthalmology - Seen for dry eye syndrome - No medication changes noted - Follow up in 26 weeks  06/28/21 Metta Clines DO - Neurology - Seen for migraine - No medication changes noted - Follow up in 6 months  06/22/21 [facility] - Seen for immunization - No medication changes noted - No follow up noted  06/16/21 Ophthalmology - Seen for [place] retinal tear - No medication changes noted - Follow up in 6 weeks  06/08/21 Nanetta Batty - Encounter for preprocedural laboratory examination - No medication changes noted - No follow up noted  05/31/21 Beverely Low PA - Ophthalmology - Seen for dry eye syndrome  - No medication changes noted - Follow up in 6 weeks  05/18/21 - Beverely Low PA - Ophthalmology - Seen for initial visit - No medication changes noted - Follow up in 13 days 04/07/21 Ihor Dow MD - Urology - Seen for Benign prostatic hyperplasia with urinary obstruction - No medication changes noted - Follow up in 1 year      Hospital visits:  None in previous 6 months  Objective:  Lab Results  Component Value Date   CREATININE 1.15 12/06/2021   CREATININE 1.12 07/11/2021   CREATININE 1.15 02/04/2020    Lab Results  Component Value Date   HGBA1C 4.8 02/02/2020   Last diabetic Eye exam: No results found for: HMDIABEYEEXA  Last diabetic Foot exam: No results found for: HMDIABFOOTEX      Component Value Date/Time   CHOL 124 12/06/2021 0000   TRIG 155 (H) 12/06/2021 0000   HDL 36 (L)  12/06/2021 0000   CHOLHDL 3.4 12/06/2021 0000   VLDL 39 (H) 06/18/2016 0859   LDLCALC 65 12/06/2021 0000    Hepatic Function Latest Ref Rng & Units 12/06/2021 07/11/2021 12/14/2019  Total Protein 6.1 - 8.1 g/dL 7.2 6.9 -  Albumin 3.5 - 5.0 - - 3.9  AST 10 - 35 U/L 21 22 24   ALT 9 - 46 U/L 24 27 38  Alk Phosphatase 38 - 126 U/L - - -  Total Bilirubin 0.2 - 1.2 mg/dL 0.6 0.6 -  Bilirubin, Direct 0.01 - 0.4 - - 0.1    Lab Results   Component Value Date/Time   TSH 1.76 03/19/2019 07:49 AM   TSH 0.790 10/29/2013 11:26 AM    CBC Latest Ref Rng & Units 07/11/2021 12/14/2019 04/16/2019  WBC 3.8 - 10.8 Thousand/uL 6.1 7.5 6.8  Hemoglobin 13.2 - 17.1 g/dL 16.1 16.4 14.2  Hematocrit 38.5 - 50.0 % 48.8 49 44.1  Platelets 140 - 400 Thousand/uL 150 - 160    Lab Results  Component Value Date/Time   VD25OH 25.2 02/28/2012 12:00 AM    Clinical ASCVD:  The ASCVD Risk score (Arnett DK, et al., 2019) failed to calculate for the following reasons:   The valid total cholesterol range is 130 to 320 mg/dL     Social History   Tobacco Use  Smoking Status Former   Packs/day: 1.50   Years: 15.00   Pack years: 22.50   Types: Cigarettes   Quit date: 10/22/1986   Years since quitting: 35.1  Smokeless Tobacco Never  Tobacco Comments   SmokE off and on   BP Readings from Last 3 Encounters:  12/07/21 137/78  11/01/21 138/82  07/04/21 136/64   Pulse Readings from Last 3 Encounters:  12/07/21 68  11/01/21 73  07/04/21 87   Wt Readings from Last 3 Encounters:  07/04/21 197 lb (89.4 kg)  06/28/21 197 lb (89.4 kg)  12/21/20 197 lb 6.4 oz (89.5 kg)    Assessment: Review of patient past medical history, allergies, medications, health status, including review of consultants reports, laboratory and other test data, was performed as part of comprehensive evaluation and provision of chronic care management services.   SDOH:  (Social Determinants of Health) assessments and interventions performed:    CCM Care Plan  Allergies  Allergen Reactions   Amlodipine Rash   Atorvastatin Other (See Comments)    Myalgias    Medications Reviewed Today     Reviewed by [name], [facility] (Pharmacist) on 12/06/21 at 1016  Med List Status: <None>   Medication Order Taking? Sig Documenting Provider Last Dose Status Informant  albuterol (PROAIR HFA) 108 (90 Base) MCG/ACT inhaler [id number] Yes Inhale 2 puffs into the lungs every 6  (six) hours as needed for wheezing or shortness of breath. Hali Marry, MD Taking Active   Alum Hydroxide-Mag Carbonate (ACID GONE ANTACID PO) [id number] Yes Take 1 tablet by mouth daily as needed. [provider] Taking Active   Black Elderberry 50 MG/5ML SYRP [id number] Yes Take 1 Dose by mouth at bedtime. [provider] Taking Active   Blood Glucose Monitoring Suppl (ONE TOUCH ULTRA 2) w/Device KIT [id number] Yes DX DM E11.9 Check fasting blood sugar 2-3 times weekly. Hali Marry, MD Taking Active   carboxymethylcellulose (REFRESH PLUS) 0.5 % SOLN [id number] Yes 1 drop 3 (three) times daily as needed. [provider] Taking Active   cetirizine (ZYRTEC) 10 MG tablet [id number] Yes TAKE 1 TABLET BY MOUTH EVERY  Nehemiah Settle, DO Taking Active   Cholecalciferol (VITAMIN D3 PO) [id number] Yes Take 1 tablet by mouth daily. [provider] Taking Active   clindamycin (CLEOCIN T) 1 % lotion [id number] Yes Apply topically 2 (two) times daily. [provider] Taking Active   clindamycin (CLEOCIN T) 1 % SWAB [id number] Yes  [provider] Taking Active            Med Note ([facility], HEATHER C   Mon Mar 10, 2018  3:11 PM)    clotrimazole-betamethasone (LOTRISONE) cream [id number] Yes Apply topically 2 (two) times daily. Hali Marry, MD Taking Active   cyclobenzaprine (FLEXERIL) 10 MG tablet [id number] Yes Take 10 mg by mouth 3 (three) times daily as needed. [provider] Taking Active   diclofenac sodium (VOLTAREN) 1 % GEL [id number] Yes Apply 2 g topically 4 (four) times daily. To affected areas Hali Marry, MD Taking Active   fluticasone [facility]) 50 MCG/ACT nasal spray [id number] Yes Place 2 sprays into both nostrils daily. Gregor Hams, MD Taking Active   glucose blood [facility] ULTRA) test strip [id number] Yes CHECK FASTING BLOOD SUGAR 2-3 TIMES WEEKLY. Hali Marry, MD Taking Active   guaifenesin  (HUMIBID E) 400 MG TABS tablet [id number] Yes Take 400 mg by mouth every 6 (six) hours as needed. [provider] Taking Active   hydrochlorothiazide (HYDRODIURIL) 25 MG tablet [id number] Yes Take 1 tablet  by mouth daily. Hali Marry, MD Taking Active   hydrocortisone (ANUSOL-HC) 25 MG suppository [id number] Yes Place 25 mg rectally as needed. Reported on 04/19/2016 [provider] Taking Active   hydrocortisone 2.5 % cream [id number] Yes Apply topically as needed. Reported on 04/19/2016 [provider] Taking Active   ipratropium (ATROVENT) 0.06 % nasal spray [id number] Yes Place 2 sprays into both nostrils every 4 (four) hours as needed for rhinitis. Gregor Hams, MD Taking Active   Iron-Folic Acid-Vit A07 (IRON FORMULA PO) [id number] Yes Take 1 Dose by mouth daily. In liquid form [provider] Taking Active   Lancets 30G MISC [id number] Yes DX DM E11.9 Check fasting blood sugar 2-3 times weekly. Hali Marry, MD Taking Active   losartan (COZAAR) 100 MG tablet [id number] Yes TAKE 1 TABLET BY MOUTH EVERY DAY Hali Marry, MD Taking Active   montelukast (SINGULAIR) 10 MG tablet [id number] Yes TAKE 1 TABLET BY MOUTH AT BEDTIME. Hali Marry, MD Taking Active   Multiple Vitamin (MULTIVITAMIN) tablet [id number] No Take 1 tablet by mouth daily.  Patient not taking: Reported on 12/06/2021   [provider] Not Taking Active   Naftifine HCl 2 % CREA [id number] Yes Apply 1 application topically 2 times a day Hali Marry, MD Taking Active   Omega-3 Fatty Acids (FISH OIL) 1000 MG CAPS [id number] No Take 1,000 mg by mouth daily.  Patient not taking: Reported on 11/01/2021   [provider] Not Taking Active   pantoprazole (PROTONIX) 40 MG tablet [id number] Yes Take 40 mg by mouth 2 (two) times daily. [provider] Taking Active   polycarbophil (FIBERCON) 625 MG tablet [id number] Yes Take 625 mg by mouth daily.  [provider] Taking Active   polyethylene glycol (MIRALAX / GLYCOLAX) 17 g packet [id number] Yes Take 17 g by mouth daily. [provider] Taking Active   propranolol ER (INDERAL LA) 60 MG 24 hr capsule [id number] Yes Take 1 capsule (60 mg total) by mouth daily. Pieter Partridge, DO Taking Active  Med Note Dorene Ar Nov 01, 2021 10:09 AM) Dewaine Conger for migraine prevention, not consistently  Propylene Glycol (SYSTANE BALANCE) 0.6 % SOLN [id number] Yes Apply 1 drop to eye daily. [provider] Taking Active   rosuvastatin (CRESTOR) 20 MG tablet [id number] Yes Take 1 tablet (20 mg total) by mouth daily. [place], MD Taking Active   sertraline (ZOLOFT) 100 MG tablet [id number] Yes Take 2 tablets by mouth daily. = 259m total daily dose, [facility] Taking Active   tadalafil (CIALIS) 5 MG tablet [id number] Take 5 mg by mouth daily. [provider] Taking Active   tamsulosin (FLOMAX) 0.4 MG CAPS capsule [id number] Take 0.4 mg by mouth. [provider] Taking Active   tiotropium (SPIRIVA) 18 MCG inhalation capsule [id number] Place 18 mcg into inhaler and inhale daily. [provider] Taking Active   topiramate (TOPAMAX) 50 MG tablet [id number] TAKE 1 TABLET BY MOUTH TWICE A DAY MHali Marry MD Taking Active   TRAZODONE HCL PO [id number] Take 100 mg by mouth in the morning and at bedtime. [facility], MD Taking Active   triamcinolone ointment (KENALOG) 0.1 % [id number]  [provider] Taking Active   valACYclovir (VALTREX) 500 MG tablet [id number] TAKE 1 TABLET BY MOUTH EVERY DAY MHali Marry MD Taking Active             Patient Active Problem List   Diagnosis Date Noted   Tingling in extremities 10/04/2020   Tinnitus of both ears 10/04/2020   Deviated septum 10/04/2020   Low  ferritin 08/04/2019   Delta beta thalassemia ([place] 04/16/2019   Asthma 03/10/2018   Primary osteoarthritis of right hand 01/30/2018   Migraine with aura and with status migrainosus, not intractable 11/21/2017   Recurrent genital herpes simplex 082/70/7867  Eosinophilic esophagitis 154/49/2010  Lumbar spondylosis with myelopathy 06/14/2017   OSA on CPAP 05/16/2017   Hearing loss 01/31/2017   Pulmonary nodule 01/31/2017   Kidney lesion, native, left 01/31/2017   Depression, recurrent ([place] 01/31/2017   Chronic cough 04/19/2016   Insomnia 11/18/2014   Gout 12/03/2013   Onychomycosis 12/03/2013   B12 deficiency 12/03/2013   Unspecified vitamin D deficiency 12/03/2013   Liver fibrosis 12/03/2013   Essential hypertension, benign 10/29/2013   History of hepatitis C virus infection 10/29/2013   GERD (gastroesophageal reflux disease) 10/29/2013   Hyperlipidemia 10/29/2013   BPH (benign prostatic hyperplasia) 10/29/2013   ED (erectile dysfunction) 10/29/2013   Hemorrhoid 10/29/2013   HSV antigen DIF positive 11/27/2011   Sciatic neuropathy 08/10/2011   Pain of lumbosacral spine 08/10/2011   Hepatitis C antibody test positive 08/10/2011    Immunization History  Administered Date(s) Administered   Influenza Split 09/03/2018   Influenza, High Dose Seasonal PF 07/22/2020   Influenza,inj,Quad PF,6+ Mos 07/14/2014, 08/23/2015   Influenza-Unspecified 10/01/2011, 08/19/2012, 08/03/2013, 08/15/2016, 08/08/2017, 08/22/2018, 08/06/2019, 08/28/2021   PFIZER Comirnaty(Gray Top)Covid-19 Tri-Sucrose Vaccine 02/01/2021   PFIZER(Purple Top)SARS-COV-2 Vaccination 12/11/2019, 01/01/2020, 09/06/2020   Pneumococcal Conjugate-13 10/01/2016   Pneumococcal Polysaccharide-23 01/15/2012, 08/25/2015   Td (Adult) 12/12/2018   Tdap 10/22/2006, 11/18/2014   Zoster Recombinat (Shingrix) 04/18/2021, 06/22/2021   Zoster, Live 11/18/2014    Conditions to be addressed/monitored: HTN and HLD  Care Plan :  Medication Management  Updates made by KDarius Bump RWatervillesince 12/07/2021 12:00 AM     Problem: HTN, HLD  Long-Range Goal: Disease Progresion Prevention   Start Date: 08/24/2021  Recent Progress: On track  Priority: High  Note:   Current Barriers:  None at present  Pharmacist Clinical Goal(s):  Over the next 90 days, patient will adhere to plan to optimize therapeutic regimen for blood pressure, cholesterol as evidenced by report of adherence to recommended medication management changes through collaboration with PharmD and provider.   Interventions: 1:1 collaboration with Hali Marry, MD regarding development and update of comprehensive plan of care as evidenced by provider attestation and co-signature Inter-disciplinary care team collaboration (see longitudinal plan of care) Comprehensive medication review performed; medication list updated in electronic medical record  Hypertension:  Controlled; current treatment:hctz 59m daily, losartan 1055mdaily; he has a goal to eliminate or reduce doses of HTN medication  Current home readings: not currently checking  Denies hypotensive/hypertensive symptoms  Counseled on technique of checking BP at home B- tricolor peppers in canola oil + oatmeal, or grits +apple, or eggs + grits + sausage + bread L- hamburger + fries, salad + meat, fish, steak, pork chops  D- noodles with chicken, fish +fries + slaw Drinks- water, very seldom drink soda,  Belongs to [employer], wants to start going Recommended patient check BP 2-3x per week and write down numbers for discussion at next phone call,  Hyperlipidemia:  Uncontrolled; current treatment:rosuvastatin 1066maily; LDL 118   Counseled on nutrition and included information in instructions for cholesterol-mindful dietary choices Recommended continue current regimen, consider repeat lipid panel in ~12 weeks Asthma:  Controlled; current treatment:albuterol PRN, spiriva daily,  ;  Recommended continue current regimen  Patient Goals/Self-Care Activities Over the next 90 days, patient will:  - take medications as prescribed and check blood pressure 2-3x per week, document, and provide at future appointments - implement DASH diet, increase physical activity in order to progress toward goal of reducing blood pressure medications  Follow Up Plan: Face to face follow up appointment with care management team member scheduled for:  3-5 months       Medication Assistance: None required.  Patient affirms current coverage meets needs.  Patient's preferred pharmacy is:  [id number] [place] - [id number]: [phone]: [phone] -[id number]: [phone]: [phone] -[id number]: [phone]: 336-[place] [address]: [phone]: [phone] pill box? No - pulls out of bottles one by one Pt endorses 100% compliance  Follow Up:  Patient agrees to Care Plan and Follow-up.  Plan: Face to Face appointment with care management team member scheduled for: 3-5 month  KeesLarinda ButteryarmD Clinical Pharmacist [facility]-[phone]

## 2021-12-07 LAB — COMPLETE METABOLIC PANEL WITH GFR
AG Ratio: 1.8 (calc) (ref 1.0–2.5)
ALT: 24 U/L (ref 9–46)
AST: 21 U/L (ref 10–35)
Albumin: 4.6 g/dL (ref 3.6–5.1)
Alkaline phosphatase (APISO): 81 U/L (ref 35–144)
BUN: 15 mg/dL (ref 7–25)
CO2: 26 mmol/L (ref 20–32)
Calcium: 9.3 mg/dL (ref 8.6–10.3)
Chloride: 100 mmol/L (ref 98–110)
Creat: 1.15 mg/dL (ref 0.70–1.28)
Globulin: 2.6 g/dL (calc) (ref 1.9–3.7)
Glucose, Bld: 118 mg/dL — ABNORMAL HIGH (ref 65–99)
Potassium: 4.1 mmol/L (ref 3.5–5.3)
Sodium: 137 mmol/L (ref 135–146)
Total Bilirubin: 0.6 mg/dL (ref 0.2–1.2)
Total Protein: 7.2 g/dL (ref 6.1–8.1)
eGFR: 68 mL/min/{1.73_m2} (ref >=60)

## 2021-12-07 LAB — LIPID PANEL W/REFLEX DIRECT LDL
Cholesterol: 124 mg/dL (ref <200)
HDL: 36 mg/dL — ABNORMAL LOW (ref >=40)
LDL Cholesterol (Calc): 65 mg/dL (calc)
Non-HDL Cholesterol (Calc): 88 mg/dL (calc) (ref <130)
Total CHOL/HDL Ratio: 3.4 (calc) (ref <5.0)
Triglycerides: 155 mg/dL — ABNORMAL HIGH (ref <150)

## 2021-12-07 NOTE — Patient Instructions (Signed)
Visit Information  Carr,  Your labwork on liver, kidney, electrolytes all look great and are within normal ranges. Your cholesterol looks fantastic!! Keep up the good work!  See you in early summer!  Thank you for taking time to visit with me today. Please don't hesitate to contact me if I can be of assistance to you before our next scheduled telephone appointment.  Following are the goals we discussed today:  Patient Goals/Self-Care Activities Over the next 90 days, patient will:  - take medications as prescribed and check blood pressure 2-3x per week, document, and provide at future appointments - implement DASH diet, increase physical activity in order to progress toward goal of reducing blood pressure medications  Follow Up Plan: Face to face follow up appointment with care management team member scheduled for:  3-5 months   Please call the care guide team at [phone] if you need to cancel or reschedule your appointment.   If you are experiencing a Mental Health or [place] or need someone to talk to, please call the Suicide and Crisis Lifeline: 988 call 1-800-273-TALK (toll free, 24 hour hotline)   Patient verbalizes understanding of instructions and care plan provided today and agrees to view in [place]. Active MyChart status confirmed with patient.    [name]

## 2021-12-07 NOTE — Progress Notes (Signed)
Hi [name], LDL looks much better and it is under 70 which is perfect.  Triglycerides also came down quite a bit which is great.  Continue medication and continue to work on healthy diet and regular exercise.  Your metabolic panel looks good.

## 2021-12-19 DIAGNOSIS — I10 Essential (primary) hypertension: Secondary | ICD-10-CM | POA: Diagnosis not present

## 2021-12-19 DIAGNOSIS — E785 Hyperlipidemia, unspecified: Secondary | ICD-10-CM | POA: Diagnosis not present

## 2021-12-19 DIAGNOSIS — J45909 Unspecified asthma, uncomplicated: Secondary | ICD-10-CM

## 2022-01-09 ENCOUNTER — Other Ambulatory Visit: Payer: Self-pay | Admitting: Family Medicine

## 2022-01-09 NOTE — Progress Notes (Signed)
? ?NEUROLOGY FOLLOW UP OFFICE NOTE ? ?Brandon [name] ?[id number] ? ?Assessment/Plan:  ? ?Migraine without aura, without status migrainosus, not intractable ?  ?Migraine prevention:  Propranolol ER 60mg  daily;  Topiramate 50mg  twice daily. ?Migraine rescue:  Tylenol ?Limit use of pain relievers to no more than 2 days out of week to prevent risk of rebound or medication-overuse headache. ?Keep headache diary ?Follow up 9 months ?  ?  ?Subjective:  ?Brandon [name] is a 71 year old right-handed male with HTN, HLD and hepatitis C who follows up for migraines.  He is accompanied by his wife who supplements history. ?  ?UPDATE: ?Intensity:  moderate-severe ?Duration:  2 hours with Tylenol. ?Frequency:  once a month ? ?Reports sinus problems.  May need to get surgery.   ? ?Frequency of abortive medication: 3 days since April.  ?Current NSAIDS/analgesics:  Tylenol 500mg  ?Current triptans:  none ?Current ergotamine:  none ?Current anti-emetic:  none ?Current muscle relaxants:  Flexeril 10mg  ?Current Antihypertensive medications:  Propranolol ER 60mg  daily.  HCTZ, losartan ?Current Antidepressant medications: sertraline 100mg  ?Current Anticonvulsant medications:  topiramate 50mg  twice daily ?Current anti-CGRP:  none ?Current Vitamins/Herbal/Supplements:  MVI ?Current Antihistamines/Decongestants:  Flonase, Zyrtec, Singulair ?Other therapy:  none ?Hormone/birth control:  none ?  ?Caffeine:  No coffee/soda ?Diet:  Hydrates with water.  Does not skip meals  ?Exercise:  walks ?Depression:  yes; Anxiety:  yes ?Other pain:  Back pain ?Sleep hygiene:  varies ?  ?HISTORY:  ?Onset:  Started after fracturing his nose in football in young adulthood.  Started having sinus issues and headaches.  Hasd deviated septum.  History of recurrent sinusitis. ?Location:  Bifrontal and top of head, face ?Quality:  pounding ?Initial Intensity:   Severe.   He denies new headache, thunderclap headache or severe headache that wakes him from  sleep. ?Aura:  no ?Associated symptoms:  Nausea, photophobia, phonophobia, mild dizziness, mild blurred vision.[name]  He denies associated unilateral numbness or weakness. ?Initial Duration:  Several hours.  He has had headaches that lasted several days. ?Initial Frequency:  6-7 days a month ?Initial Frequency of abortive medication: at least 3 days a week ?Triggers:  Seasonal allergies and sinus infections ?Relieving factors:  rest ?Activity:  aggravates ?  ?  ?Past NSAIDS/analgesics:  Tramadol, ibuprofen 800mg  ?Past abortive triptans:  none ?Past abortive ergotamine:  none ?Past muscle relaxants:  baclofen ?Past anti-emetic:  none ?Past antihypertensive medications:  Verapamil, amlodipine ?Past antidepressant medications:  Cybalta ?Past anticonvulsant medications:  none ?Past anti-CGRP:  none ?Past vitamins/Herbal/Supplements:  none ?Past antihistamines/decongestants:  none ?Other past therapies:  none ?  ?  ?Family history of headache:  no ? ?PAST MEDICAL HISTORY: ?Past Medical History:  ?Diagnosis Date  ? ED (erectile dysfunction)   ? GERD (gastroesophageal reflux disease)   ? Hepatitis C   ? [phone], treated at [facility]  ? Hyperlipidemia   ? Hypertension   ? Nose fracture   ? ? ?MEDICATIONS: ?Current Outpatient Medications on File Prior to Visit  ?Medication Sig Dispense Refill  ? albuterol (PROAIR HFA) 108 (90 Base) MCG/ACT inhaler Inhale 2 puffs into the lungs every 6 (six) hours as needed for wheezing or shortness of breath. 18 g 4  ? Alum Hydroxide-Mag Carbonate (ACID GONE ANTACID PO) Take 1 tablet by mouth daily as needed.    ? Black Elderberry 50 MG/5ML SYRP Take 1 Dose by mouth at bedtime.    ? Blood Glucose Monitoring Suppl (ONE TOUCH ULTRA 2) w/Device KIT DX DM E11.9  Check fasting blood sugar 2-3 times weekly. 1 kit prn  ? carboxymethylcellulose (REFRESH PLUS) 0.5 % SOLN 1 drop 3 (three) times daily as needed.    ? cetirizine (ZYRTEC) 10 MG tablet TAKE 1 TABLET BY MOUTH EVERY DAY 90 tablet 3  ?  Cholecalciferol (VITAMIN D3 PO) Take 1 tablet by mouth daily.    ? clindamycin (CLEOCIN T) 1 % lotion Apply topically 2 (two) times daily.    ? clindamycin (CLEOCIN T) 1 % SWAB     ? clotrimazole-betamethasone (LOTRISONE) cream Apply topically 2 (two) times daily. 45 g 3  ? cyclobenzaprine (FLEXERIL) 10 MG tablet Take 10 mg by mouth 3 (three) times daily as needed.    ? diclofenac sodium (VOLTAREN) 1 % GEL Apply 2 g topically 4 (four) times daily. To affected areas 400 g PRN  ? fluticasone (FLONASE) 50 MCG/ACT nasal spray Place 2 sprays into both nostrils daily. 16 g 2  ? glucose blood (ONETOUCH ULTRA) test strip CHECK FASTING BLOOD SUGAR 2-3 TIMES WEEKLY. 50 strip 25  ? guaifenesin (HUMIBID E) 400 MG TABS tablet Take 400 mg by mouth every 6 (six) hours as needed.    ? hydrochlorothiazide (HYDRODIURIL) 25 MG tablet Take 1 tablet  by mouth daily. 90 tablet 1  ? hydrocortisone (ANUSOL-HC) 25 MG suppository Place 25 mg rectally as needed. Reported on 04/19/2016    ? hydrocortisone 2.5 % cream Apply topically as needed. Reported on 04/19/2016    ? ipratropium (ATROVENT) 0.06 % nasal spray Place 2 sprays into both nostrils every 4 (four) hours as needed for rhinitis. 10 mL 6  ? Iron-Folic Acid-Vit L84 (IRON FORMULA PO) Take 1 Dose by mouth daily. In liquid form    ? Lancets 30G MISC DX DM E11.9 Check fasting blood sugar 2-3 times weekly. 100 each prn  ? losartan (COZAAR) 100 MG tablet TAKE 1 TABLET BY MOUTH EVERY DAY 90 tablet 1  ? montelukast (SINGULAIR) 10 MG tablet TAKE 1 TABLET BY MOUTH AT BEDTIME. 30 tablet 5  ? Multiple Vitamin (MULTIVITAMIN) tablet Take 1 tablet by mouth daily. (Patient not taking: Reported on 12/06/2021)    ? Naftifine HCl 2 % CREA Apply 1 application topically 2 times a day 60 g 11  ? Omega-3 Fatty Acids (FISH OIL) 1000 MG CAPS Take 1,000 mg by mouth daily. (Patient not taking: Reported on 11/01/2021)    ? pantoprazole (PROTONIX) 40 MG tablet Take 40 mg by mouth 2 (two) times daily.    ?  polycarbophil (FIBERCON) 625 MG tablet Take 625 mg by mouth daily.    ? polyethylene glycol (MIRALAX / GLYCOLAX) 17 g packet Take 17 g by mouth daily.    ? propranolol ER (INDERAL LA) 60 MG 24 hr capsule Take 1 capsule (60 mg total) by mouth daily. 30 capsule 3  ? Propylene Glycol (SYSTANE BALANCE) 0.6 % SOLN Apply 1 drop to eye daily.    ? rosuvastatin (CRESTOR) 20 MG tablet Take 1 tablet (20 mg total) by mouth daily. [phone] 90 tablet 3  ? sertraline (ZOLOFT) 100 MG tablet Take 2 tablets by mouth daily. = 249m total daily dose[facility]   ? tadalafil (CIALIS) 5 MG tablet Take 5 mg by mouth daily.  11  ? tamsulosin (FLOMAX) 0.4 MG CAPS capsule Take 0.4 mg by mouth.    ? tiotropium (SPIRIVA) 18 MCG inhalation capsule Place 18 mcg into inhaler and inhale daily.    ? topiramate (TOPAMAX) 50 MG  tablet TAKE 1 TABLET BY MOUTH TWICE A DAY 180 tablet 0  ? TRAZODONE HCL PO Take 100 mg by mouth in the morning and at bedtime. [facility] at [facility]    ? triamcinolone ointment (KENALOG) 0.1 %     ? valACYclovir (VALTREX) 500 MG tablet TAKE 1 TABLET BY MOUTH EVERY DAY 90 tablet 1  ? ?No current facility-administered medications on file prior to visit.  ? ? ?ALLERGIES: ?Allergies  ?Allergen Reactions  ? Amlodipine Rash  ? Atorvastatin Other (See Comments)  ?  Myalgias  ? ? ?FAMILY HISTORY: ?Family History  ?Problem Relation Age of Onset  ? Breast cancer Sister   ? Depression Sister   ? Hyperlipidemia Other   ? Ovarian cancer Sister   ? Hypertension Other   ? Heart disease Brother   ? ? ?  ?Objective:  ?Blood pressure (!) 143/70, pulse 78, resp. rate 18, height _0  (1.702 m), weight 194 lb (88 kg), SpO2 97 %. ?General: No acute distress.  Patient appears well-groomed.   ?Head:  Normocephalic/atraumatic ?Eyes:  Fundi examined but not visualized ?Neck: supple, no paraspinal tenderness, full range of motion ?Heart:  Regular rate and rhythm ?Lungs:  Clear to auscultation bilaterally ?Back: No  paraspinal tenderness ?Neurological Exam: alert and oriented to person, place, and time.  Speech fluent and not dysarthric, language intact.  CN II-XII intact. Bulk and tone normal, muscle strength 5/5 throughout.  Sensation to light

## 2022-01-10 ENCOUNTER — Encounter: Payer: Self-pay | Admitting: Neurology

## 2022-01-10 ENCOUNTER — Other Ambulatory Visit: Payer: Self-pay

## 2022-01-10 ENCOUNTER — Ambulatory Visit (INDEPENDENT_AMBULATORY_CARE_PROVIDER_SITE_OTHER): Payer: Medicare Other | Admitting: Neurology

## 2022-01-10 VITALS — BP 143/70 | HR 78 | Resp 18 | Ht 67.0 in | Wt 194.0 lb

## 2022-01-10 DIAGNOSIS — G43009 Migraine without aura, not intractable, without status migrainosus: Secondary | ICD-10-CM

## 2022-01-10 MED ORDER — PROPRANOLOL HCL ER 60 MG PO CP24: 60.0000 mg | ORAL_CAPSULE | Freq: Every day | ORAL | 1 refills | Status: DC

## 2022-01-10 NOTE — Patient Instructions (Addendum)
Propranolol ER '60mg'$  daily ?Topiramate '50mg'$  twice daily ?Follow up 9 months. ?

## 2022-01-22 ENCOUNTER — Other Ambulatory Visit: Payer: Self-pay | Admitting: Family Medicine

## 2022-01-22 DIAGNOSIS — G43701 Chronic migraine without aura, not intractable, with status migrainosus: Secondary | ICD-10-CM

## 2022-01-27 ENCOUNTER — Other Ambulatory Visit: Payer: Self-pay | Admitting: Family Medicine

## 2022-01-27 DIAGNOSIS — A6000 Herpesviral infection of urogenital system, unspecified: Secondary | ICD-10-CM

## 2022-02-02 ENCOUNTER — Other Ambulatory Visit: Payer: Self-pay | Admitting: Family Medicine

## 2022-02-02 DIAGNOSIS — G43701 Chronic migraine without aura, not intractable, with status migrainosus: Secondary | ICD-10-CM

## 2022-03-08 ENCOUNTER — Other Ambulatory Visit: Payer: Self-pay | Admitting: Family Medicine

## 2022-03-08 DIAGNOSIS — I10 Essential (primary) hypertension: Secondary | ICD-10-CM

## 2022-03-21 ENCOUNTER — Ambulatory Visit (INDEPENDENT_AMBULATORY_CARE_PROVIDER_SITE_OTHER): Payer: No Typology Code available for payment source | Admitting: Pharmacist

## 2022-03-21 VITALS — BP 126/79 | HR 65

## 2022-03-21 DIAGNOSIS — I10 Essential (primary) hypertension: Secondary | ICD-10-CM

## 2022-03-21 DIAGNOSIS — E785 Hyperlipidemia, unspecified: Secondary | ICD-10-CM

## 2022-03-21 NOTE — Progress Notes (Signed)
Chronic Care Management Pharmacy Note  03/21/2022 Name:  Brandon [name] MRN:  [id number] DOB:  09-May-1951  Summary: addressed HTN, HLD. Patient expresses a goal of eliminating or reducing dosage of blood pressure medications. He states he wants to really fine-tune diet and exercise to achieve this. We discussed details of current nutrition & activity level as well as recommendations.   BP readings at home:  128/67 this AM 132 is highest systolic Previously was SBP 150s in morning, and SBP 120s after medicine.  Patient Goals: weight loss,exercising more (weights in basement), walking early in AM or late in evening  Recommendations/Changes made from today's visit:   No medication changes, though he is definitely making improvements! Continue great work with nutrition & exercise, and if blood pressure reaches systolic 532D consistently, we discussed maybe we could trial decrease/discontinue of blood pressure medication. Would reduce HCTZ first.  Patient to continue checking BP readings at home & document for future visits.  Plan: f/u with pharmacist in 3 months  Subjective: Brandon [name] is an 71 y.o. year old male who is a primary patient of Metheney, Rene Kocher, MD.  The CCM team was consulted for assistance with disease management and care coordination needs.    Engaged with patient face to face for follow up visit in response to provider referral for pharmacy case management and/or care coordination services.   Consent to Services:  The patient was given information about Chronic Care Management services, agreed to services, and gave verbal consent prior to initiation of services.  Please see initial visit note for detailed documentation.   Patient Care Team: Hali Marry, MD as PCP - General (Family Medicine) Hali Marry, MD as Consulting Physician (Family Medicine) [name], [facility] as Pharmacist (Pharmacist)  Recent office visits:  07/04/21  Posey Pronto MD - Seen for medicare annual wellness exam - Labs ordered - No medication changes noted - Follow up in one year    Recent consult visits:  07/05/21 Beverely Low PA - Ophthalmology - Seen for dry eye syndrome - No medication changes noted - Follow up in 26 weeks  06/28/21 Metta Clines DO - Neurology - Seen for migraine - No medication changes noted - Follow up in 6 months  06/22/21 [place] [place] [facility] - Seen for immunization - No medication changes noted - No follow up noted  06/16/21 Ophthalmology - Seen for [place] retinal tear - No medication changes noted - Follow up in 6 weeks  06/08/21 Nanetta Batty - Encounter for preprocedural laboratory examination - No medication changes noted - No follow up noted  05/31/21 Beverely Low PA - Ophthalmology - Seen for dry eye syndrome  - No medication changes noted - Follow up in 6 weeks  05/18/21 - Beverely Low PA - Ophthalmology - Seen for initial visit - No medication changes noted - Follow up in 13 days 04/07/21 Ihor Dow MD - Urology - Seen for Benign prostatic hyperplasia with urinary obstruction - No medication changes noted - Follow up in 1 year      Hospital visits:  None in previous 6 months  Objective:  Lab Results  Component Value Date   CREATININE 1.15 12/06/2021   CREATININE 1.12 07/11/2021   CREATININE 1.15 02/04/2020    Lab Results  Component Value Date   HGBA1C 4.8 02/02/2020   Last diabetic Eye exam: No results found for: HMDIABEYEEXA  Last diabetic Foot exam: No results found for: HMDIABFOOTEX  Component Value Date/Time   CHOL 124 12/06/2021 0000   TRIG 155 (H) 12/06/2021 0000   HDL 36 (L) 12/06/2021 0000   CHOLHDL 3.4 12/06/2021 0000   VLDL 39 (H) 06/18/2016 0859   LDLCALC 65 12/06/2021 0000       Latest Ref Rng & Units 12/06/2021   12:00 AM 07/11/2021   12:00 AM 12/14/2019   12:00 AM  Hepatic Function  Total Protein 6.1 - 8.1 g/dL 7.2   6.9     Albumin 3.5 - 5.0   3.9       AST 10 - 35 U/L 21    22   24        ALT 9 - 46 U/L 24   27   38       Total Bilirubin 0.2 - 1.2 mg/dL 0.6   0.6     Bilirubin, Direct 0.01 - 0.4   0.1          This result is from an external source.    Lab Results  Component Value Date/Time   TSH 1.76 03/19/2019 07:49 AM   TSH 0.790 10/29/2013 11:26 AM       Latest Ref Rng & Units 07/11/2021   12:00 AM 12/14/2019   12:00 AM 04/16/2019   11:28 AM  CBC  WBC 3.8 - 10.8 Thousand/uL 6.1   7.5      6.8    Hemoglobin 13.2 - 17.1 g/dL 16.1   16.4      14.2    Hematocrit 38.5 - 50.0 % 48.8   49      44.1    Platelets 140 - 400 Thousand/uL 150    160       This result is from an external source.    Lab Results  Component Value Date/Time   VD25OH 25.2 02/28/2012 12:00 AM    Clinical ASCVD:  The ASCVD Risk score (Arnett DK, et al., 2019) failed to calculate for the following reasons:   The valid total cholesterol range is 130 to 320 mg/dL     Social History   Tobacco Use  Smoking Status Former   Packs/day: 1.50   Years: 15.00   Pack years: 22.50   Types: Cigarettes   Quit date: 10/22/1986   Years since quitting: 35.4  Smokeless Tobacco Never  Tobacco Comments   SmokE off and on   BP Readings from Last 3 Encounters:  01/10/22 (!) 143/70  12/07/21 137/78  11/01/21 138/82   Pulse Readings from Last 3 Encounters:  01/10/22 78  12/07/21 68  11/01/21 73   Wt Readings from Last 3 Encounters:  01/10/22 194 lb (88 kg)  07/04/21 197 lb (89.4 kg)  06/28/21 197 lb (89.4 kg)    Assessment: Review of patient past medical history, allergies, medications, health status, including review of consultants reports, laboratory and other test data, was performed as part of comprehensive evaluation and provision of chronic care management services.   SDOH:  (Social Determinants of Health) assessments and interventions performed:    CCM Care Plan  Allergies  Allergen Reactions   Amlodipine Rash   Atorvastatin Other (See Comments)    Myalgias     Medications Reviewed Today     Reviewed by Reather Laurence, LPN (Licensed Practical Nurse) on 01/10/22 at 10  Med List Status: <None>   Medication Order Taking? Sig Documenting Provider Last Dose Status Informant  albuterol (PROAIR HFA) 108 (90 Base) MCG/ACT inhaler [id number] Yes Inhale 2 puffs into  the lungs every 6 (six) hours as needed for wheezing or shortness of breath. Hali Marry, MD Taking Active   Alum Hydroxide-Mag Carbonate (ACID GONE ANTACID PO) [id number] Yes Take 1 tablet by mouth daily as needed. [provider] Taking Active   Black Elderberry 50 MG/5ML SYRP [id number] Yes Take 1 Dose by mouth at bedtime. [provider] Taking Active   Blood Glucose Monitoring Suppl (ONE TOUCH ULTRA 2) w/Device KIT [id number] Yes DX DM E11.9 Check fasting blood sugar 2-3 times weekly. Hali Marry, MD Taking Active   carboxymethylcellulose (REFRESH PLUS) 0.5 % SOLN [id number] Yes 1 drop 3 (three) times daily as needed. [provider] Taking Active   cetirizine (ZYRTEC) 10 MG tablet [id number] Yes TAKE 1 TABLET BY MOUTH EVERY DAY Emeterio Reeve, DO Taking Active   Cholecalciferol (VITAMIN D3 PO) [id number] Yes Take 1 tablet by mouth daily. [provider] Taking Active   clindamycin (CLEOCIN T) 1 % lotion [id number] Yes Apply topically 2 (two) times daily. [provider] Taking Active   clindamycin (CLEOCIN T) 1 % SWAB [id number] Yes  [provider] Taking Active            Med Note ([facility], [place]   Mon Mar 10, 2018  3:11 PM)    clotrimazole-betamethasone (LOTRISONE) cream [id number]  Apply topically 2 (two) times daily. Hali Marry, MD  Active   cyclobenzaprine (FLEXERIL) 10 MG tablet [id number] Yes Take 10 mg by mouth 3 (three) times daily as needed. [provider] Taking Active   diclofenac sodium (VOLTAREN) 1 % GEL [id number] Yes Apply 2 g topically 4 (four) times daily. To affected areas Hali Marry, MD Taking Active   fluticasone [facility]) 50 MCG/ACT nasal spray [id number] Yes Place 2 sprays into both nostrils daily. Gregor Hams, MD Taking Active   glucose blood [facility] ULTRA) test strip [id number] Yes CHECK FASTING BLOOD SUGAR 2-3 TIMES WEEKLY. Hali Marry, MD Taking Active   guaifenesin (HUMIBID E) 400 MG TABS tablet [id number] Yes Take 400 mg by mouth every 6 (six) hours as needed. [provider] Taking Active   hydrochlorothiazide (HYDRODIURIL) 25 MG tablet [id number] Yes Take 1 tablet  by mouth daily. Hali Marry, MD Taking Active   hydrocortisone (ANUSOL-HC) 25 MG suppository [id number] Yes Place 25 mg rectally as needed. Reported on 04/19/2016 [provider] Taking Active   hydrocortisone 2.5 % cream [id number] Yes Apply topically as needed. Reported on 04/19/2016 [provider] Taking Active   ipratropium (ATROVENT) 0.06 % nasal spray [id number] Yes Place 2 sprays into both nostrils every 4 (four) hours as needed for rhinitis. Gregor Hams, MD Taking Active   Iron-Folic Acid-Vit T62 (IRON FORMULA PO) [id number] Yes Take 1 Dose by mouth daily. In liquid form [provider] Taking Active   Lancets 30G MISC [id number]  DX DM E11.9 Check fasting blood sugar 2-3 times weekly. Hali Marry, MD  Active   losartan (COZAAR) 100 MG tablet [id number] Yes TAKE 1 TABLET BY MOUTH EVERY DAY Hali Marry, MD Taking Active   montelukast (SINGULAIR) 10 MG tablet [id number] Yes TAKE 1 TABLET BY MOUTH AT BEDTIME. Hali Marry, MD Taking Active   Multiple Vitamin (MULTIVITAMIN) tablet [id number] Yes Take 1 tablet by mouth daily. [provider] Taking Active   Naftifine HCl 2 % CREA [id number]  Apply 1 application topically 2 times a day Hali Marry, MD  Active   Omega-3 Fatty Acids ([place]  OIL) 1000 MG CAPS [id number] Yes Take 1,000 mg by mouth daily. [provider] Taking Active   pantoprazole  (PROTONIX) 40 MG tablet [id number] Yes Take 40 mg by mouth 2 (two) times daily. [provider] Taking Active   polycarbophil (FIBERCON) 625 MG tablet [id number] Yes Take 625 mg by mouth daily. [provider] Taking Active   polyethylene glycol (MIRALAX / GLYCOLAX) 17 g packet [id number] Yes Take 17 g by mouth daily. [provider] Taking Active   propranolol ER (INDERAL LA) 60 MG 24 hr capsule [id number] Yes Take 1 capsule (60 mg total) by mouth daily. Pieter Partridge, DO Taking Active            Med Note Dorene Ar Nov 01, 2021 10:09 AM) Dewaine Conger for migraine prevention, not consistently  Propylene Glycol (SYSTANE BALANCE) 0.6 % SOLN [id number] Yes Apply 1 drop to eye daily. [provider] Taking Active   rosuvastatin (CRESTOR) 20 MG tablet [id number] Yes Take 1 tablet (20 mg total) by mouth daily. [place], MD Taking Active   sertraline (ZOLOFT) 100 MG tablet [id number] Yes Take 2 tablets by mouth daily. = 2109m total daily dose, [facility] Taking Active   tadalafil (CIALIS) 5 MG tablet [id number] Take 5 mg by mouth daily. [provider] Taking Active   tamsulosin (FLOMAX) 0.4 MG CAPS capsule [id number] Take 0.4 mg by mouth. [provider] Taking Active   tiotropium (SPIRIVA) 18 MCG inhalation capsule [id number] Place 18 mcg into inhaler and inhale daily. [provider] Taking Active   topiramate (TOPAMAX) 50 MG tablet [id number] TAKE 1 TABLET BY MOUTH TWICE A DAY MHali Marry MD Taking Active   TRAZODONE HCL PO [id number] Take 100 mg by mouth in the morning and at bedtime. [facility] Taking Active   triamcinolone ointment (KENALOG) 0.1 % [id number]  [provider] Taking Active   valACYclovir (VALTREX) 500 MG tablet [id number] TAKE 1 TABLET BY MOUTH EVERY DAY MHali Marry  MD Taking Active             Patient Active Problem List   Diagnosis Date Noted   Tingling in extremities 10/04/2020   Tinnitus of both ears 10/04/2020   Deviated septum 10/04/2020   Low ferritin 08/04/2019   Delta beta thalassemia ([place] 04/16/2019   Asthma 03/10/2018   Primary osteoarthritis of right hand 01/30/2018   Migraine with aura and with status migrainosus, not intractable 11/21/2017   Recurrent genital herpes simplex 097/11/6376  Eosinophilic esophagitis 158/85/0277  Lumbar spondylosis with myelopathy 06/14/2017   OSA on CPAP 05/16/2017   Hearing loss 01/31/2017   Pulmonary nodule 01/31/2017   Kidney lesion, native, left 01/31/2017   Depression, recurrent ([place]ock[place] 01/31/2017   Chronic cough 04/19/2016   Insomnia 11/18/2014   Gout 12/03/2013   Onychomycosis 12/03/2013   B12 deficiency 12/03/2013   Unspecified vitamin D deficiency 12/03/2013   Liver fibrosis 12/03/2013   Essential hypertension, benign 10/29/2013   History of hepatitis C virus infection 10/29/2013   GERD (gastroesophageal reflux disease) 10/29/2013   Hyperlipidemia 10/29/2013   BPH (benign prostatic hyperplasia) 10/29/2013   ED (erectile dysfunction) 10/29/2013   Hemorrhoid 10/29/2013   HSV antigen DIF positive 11/27/2011   Sciatic neuropathy 08/10/2011   Pain of lumbosacral spine 08/10/2011   Hepatitis C antibody test positive 08/10/2011  Immunization History  Administered Date(s) Administered   Influenza Split 09/03/2018   Influenza, High Dose Seasonal PF 07/22/2020   Influenza,inj,Quad PF,6+ Mos 07/14/2014, 08/23/2015   Influenza-Unspecified 10/01/2011, 08/19/2012, 08/03/2013, 08/15/2016, 08/08/2017, 08/22/2018, 08/06/2019, 08/28/2021   PFIZER Comirnaty(Gray Top)Covid-19 Tri-Sucrose Vaccine 02/01/2021   PFIZER(Purple Top)SARS-COV-2 Vaccination 12/11/2019, 01/01/2020, 09/06/2020   Pneumococcal Conjugate-13 10/01/2016   Pneumococcal Polysaccharide-23 01/15/2012, 08/25/2015   Td (Adult)  12/12/2018   Tdap 10/22/2006, 11/18/2014   Zoster Recombinat (Shingrix) 04/18/2021, 06/22/2021   Zoster, Live 11/18/2014    Conditions to be addressed/monitored: HTN and HLD  There are no care plans that you recently modified to display for this patient.     Medication Assistance: None required.  Patient affirms current coverage meets needs.  Patient's preferred pharmacy is:  [id number] [place], [place] - [id number] [address]Phone: [phone]: [phone] [address] [place] [address]Phone: [phone]: [phone] [place] [address]Phone: [phone]: [address] [address]PE[address]Phone: [phone]: [phone]  Uses pill box? No - pulls out of bottles one by one Pt endorses 100% compliance  Follow Up:  Patient agrees to Care Plan and Follow-up.  Plan: Face to Face appointment with care management team member scheduled for: 3 month  KLarinda Buttery PharmD Clinical Pharmacist [phone]

## 2022-03-21 NOTE — Patient Instructions (Signed)
Visit Information  Thank you for taking time to visit with me today. Please don't hesitate to contact me if I can be of assistance to you before our next scheduled telephone appointment.  Following are the goals we discussed today:   Patient Goals/Self-Care Activities Over the next 90 days, patient will:  - take medications as prescribed and check blood pressure 2-3x per week, document, and provide at future appointments - implement DASH diet, increase physical activity in order to progress toward goal of reducing blood pressure medications  Follow Up Plan: Face to face follow up appointment with care management team member scheduled for:  3 months   Please call the care guide team at [phone] if you need to cancel or reschedule your appointment.    Patient verbalizes understanding of instructions and care plan provided today and agrees to view in [place]. Active MyChart status and patient understanding of how to access instructions and care plan via MyChart confirmed with patient.     Brandon [name]

## 2022-04-09 DIAGNOSIS — N401 Enlarged prostate with lower urinary tract symptoms: Secondary | ICD-10-CM | POA: Diagnosis not present

## 2022-04-09 DIAGNOSIS — N138 Other obstructive and reflux uropathy: Secondary | ICD-10-CM | POA: Diagnosis not present

## 2022-04-09 DIAGNOSIS — N5201 Erectile dysfunction due to arterial insufficiency: Secondary | ICD-10-CM | POA: Diagnosis not present

## 2022-05-02 ENCOUNTER — Other Ambulatory Visit: Payer: Self-pay | Admitting: Family Medicine

## 2022-05-02 DIAGNOSIS — G43701 Chronic migraine without aura, not intractable, with status migrainosus: Secondary | ICD-10-CM

## 2022-05-25 ENCOUNTER — Other Ambulatory Visit: Payer: Self-pay | Admitting: Family Medicine

## 2022-05-25 DIAGNOSIS — G43701 Chronic migraine without aura, not intractable, with status migrainosus: Secondary | ICD-10-CM

## 2022-06-27 ENCOUNTER — Ambulatory Visit (INDEPENDENT_AMBULATORY_CARE_PROVIDER_SITE_OTHER): Payer: Medicare Other | Admitting: Pharmacist

## 2022-06-27 VITALS — BP 132/72 | HR 70

## 2022-06-27 DIAGNOSIS — E785 Hyperlipidemia, unspecified: Secondary | ICD-10-CM

## 2022-06-27 DIAGNOSIS — I10 Essential (primary) hypertension: Secondary | ICD-10-CM

## 2022-06-27 NOTE — Progress Notes (Signed)
Chronic Care Management Pharmacy Note  06/27/2022 Name:  Brandon [name] MRN:  [id number] DOB:  1951/08/13  Summary: addressed HTN, HLD. Patient expresses a goal of eliminating or reducing dosage of blood pressure medications. He states he wants to really fine-tune diet and exercise to achieve this. We discussed details of current nutrition & activity level as well as recommendations. He has fallen off track slightly during the summer, as expected, from vacation and cruises.  BP readings at home:  121/68 138/70s BP 141/70s at highest  Previously was SBP 150s in morning, and SBP 120s after medicine.  Patient Goals: weight loss,exercising more (weights in basement), walking  Recommendations/Changes made from today's visit:   -No medication changes.  -Continue great work with nutrition & exercise, and if blood pressure reaches systolic 606T consistently, we discussed maybe we could trial decrease/discontinue of blood pressure medication. Would reduce HCTZ first.  Patient to continue checking BP readings at home & document for future visits.  Plan: f/u with pharmacist in 6 months  Subjective: Brandon [name] is an 71 y.o. year old male who is a primary patient of Metheney, Rene Kocher, MD.  The CCM team was consulted for assistance with disease management and care coordination needs.    Engaged with patient face to face for follow up visit in response to provider referral for pharmacy case management and/or care coordination services.   Consent to Services:  The patient was given information about Chronic Care Management services, agreed to services, and gave verbal consent prior to initiation of services.  Please see initial visit note for detailed documentation.   Patient Care Team: Hali Marry, MD as PCP - General (Family Medicine) Hali Marry, MD as Consulting Physician (Family Medicine) [name], [facility] as Pharmacist (Pharmacist)  Recent office  visits:  07/04/21 Posey Pronto MD - Seen for medicare annual wellness exam - Labs ordered - No medication changes noted - Follow up in one year    Recent consult visits:  07/05/21 Beverely Low PA - Ophthalmology - Seen for dry eye syndrome - No medication changes noted - Follow up in 26 weeks  06/28/21 Metta Clines DO - Neurology - Seen for migraine - No medication changes noted - Follow up in 6 months  06/22/21 [facility] - Seen for immunization - No medication changes noted - No follow up noted  06/16/21 Ophthalmology - Seen for [place] retinal tear - No medication changes noted - Follow up in 6 weeks  06/08/21 Nanetta Batty - Encounter for preprocedural laboratory examination - No medication changes noted - No follow up noted  05/31/21 Beverely Low PA - Ophthalmology - Seen for dry eye syndrome  - No medication changes noted - Follow up in 6 weeks  05/18/21 - Beverely Low PA - Ophthalmology - Seen for initial visit - No medication changes noted - Follow up in 13 days 04/07/21 Ihor Dow MD - Urology - Seen for Benign prostatic hyperplasia with urinary obstruction - No medication changes noted - Follow up in 1 year      Hospital visits:  None in previous 6 months  Objective:  Lab Results  Component Value Date   CREATININE 1.15 12/06/2021   CREATININE 1.12 07/11/2021   CREATININE 1.15 02/04/2020    Lab Results  Component Value Date   HGBA1C 4.8 02/02/2020   Last diabetic Eye exam: No results found for: "HMDIABEYEEXA"  Last diabetic Foot exam: No results found for: "HMDIABFOOTEX"  Component Value Date/Time   CHOL 124 12/06/2021 0000   TRIG 155 (H) 12/06/2021 0000   HDL 36 (L) 12/06/2021 0000   CHOLHDL 3.4 12/06/2021 0000   VLDL 39 (H) 06/18/2016 0859   LDLCALC 65 12/06/2021 0000       Latest Ref Rng & Units 12/06/2021   12:00 AM 07/11/2021   12:00 AM 12/14/2019   12:00 AM  Hepatic Function  Total Protein 6.1 - 8.1 g/dL 7.2  6.9    Albumin 3.5 - 5.0   3.9       AST 10 - 35 U/L 21  22  24       ALT 9 - 46 U/L 24  27  38      Total Bilirubin 0.2 - 1.2 mg/dL 0.6  0.6    Bilirubin, Direct 0.01 - 0.4   0.1         This result is from an external source.    Lab Results  Component Value Date/Time   TSH 1.76 03/19/2019 07:49 AM   TSH 0.790 10/29/2013 11:26 AM       Latest Ref Rng & Units 07/11/2021   12:00 AM 12/14/2019   12:00 AM 04/16/2019   11:28 AM  CBC  WBC 3.8 - 10.8 Thousand/uL 6.1  7.5     6.8   Hemoglobin 13.2 - 17.1 g/dL 16.1  16.4     14.2   Hematocrit 38.5 - 50.0 % 48.8  49     44.1   Platelets 140 - 400 Thousand/uL 150   160      This result is from an external source.    Lab Results  Component Value Date/Time   VD25OH 25.2 02/28/2012 12:00 AM    Clinical ASCVD:  The ASCVD Risk score (Arnett DK, et al., 2019) failed to calculate for the following reasons:   The valid total cholesterol range is 130 to 320 mg/dL     Social History   Tobacco Use  Smoking Status Former   Packs/day: 1.50   Years: 15.00   Total pack years: 22.50   Types: Cigarettes   Quit date: 10/22/1986   Years since quitting: 35.7  Smokeless Tobacco Never  Tobacco Comments   SmokE off and on   BP Readings from Last 3 Encounters:  03/21/22 126/79  01/10/22 (!) 143/70  12/07/21 137/78   Pulse Readings from Last 3 Encounters:  03/21/22 65  01/10/22 78  12/07/21 68   Wt Readings from Last 3 Encounters:  01/10/22 194 lb (88 kg)  07/04/21 197 lb (89.4 kg)  06/28/21 197 lb (89.4 kg)    Assessment: Review of patient past medical history, allergies, medications, health status, including review of consultants reports, laboratory and other test data, was performed as part of comprehensive evaluation and provision of chronic care management services.   SDOH:  (Social Determinants of Health) assessments and interventions performed:  SDOH Interventions    Flowsheet Row Office Visit from 07/14/2019 in [place]   SDOH Interventions   Depression Interventions/Treatment  Currently on Treatment       CCM Care Plan  Allergies  Allergen Reactions   Amlodipine Rash   Atorvastatin Other (See Comments)    Myalgias    Medications Reviewed Today     Reviewed by [name], [facility] (Pharmacist) on 03/21/22 at 1118  Med List Status: <None>   Medication Order Taking? Sig Documenting Provider Last Dose Status Informant  albuterol (PROAIR HFA) 108 (  90 Base) MCG/ACT inhaler [id number] Yes Inhale 2 puffs into the lungs every 6 (six) hours as needed for wheezing or shortness of breath. Hali Marry, MD Taking Active   Alum Hydroxide-Mag Carbonate (ACID GONE ANTACID PO) [id number] Yes Take 1 tablet by mouth daily as needed. [provider] Taking Active   Black Elderberry 50 MG/5ML SYRP [id number] Yes Take 1 Dose by mouth at bedtime. [provider] Taking Active   Blood Glucose Monitoring Suppl (ONE TOUCH ULTRA 2) w/Device KIT [id number] Yes DX DM E11.9 Check fasting blood sugar 2-3 times weekly. Hali Marry, MD Taking Active   carboxymethylcellulose (REFRESH PLUS) 0.5 % SOLN [id number] Yes 1 drop 3 (three) times daily as needed. [provider] Taking Active   cetirizine (ZYRTEC) 10 MG tablet [id number] Yes TAKE 1 TABLET BY MOUTH EVERY DAY Emeterio Reeve, DO Taking Active   Cholecalciferol (VITAMIN D3 PO) [id number] Yes Take 1 tablet by mouth daily. [provider] Taking Active   clindamycin (CLEOCIN T) 1 % lotion [id number] Yes Apply topically 2 (two) times daily. [provider] Taking Active   clindamycin (CLEOCIN T) 1 % SWAB [id number] Yes  [provider] Taking Active            Med Note (COX, HEATHER C   Mon Mar 10, 2018  3:11 PM)    clotrimazole-betamethasone (LOTRISONE) cream [id number] Yes Apply topically 2 (two) times daily. Hali Marry, MD Taking Active   cyclobenzaprine (FLEXERIL) 10 MG tablet [id number] Yes Take 10 mg  by mouth 3 (three) times daily as needed. [provider] Taking Active   diclofenac sodium (VOLTAREN) 1 % GEL [id number] No Apply 2 g topically 4 (four) times daily. To affected areas  Patient not taking: Reported on 03/21/2022   Hali Marry, MD Not Taking Active   fluticasone [facility]) 50 MCG/ACT nasal spray [id number] Yes Place 2 sprays into both nostrils daily. Gregor Hams, MD Taking Active   glucose blood [facility] ULTRA) test strip [id number] Yes CHECK FASTING BLOOD SUGAR 2-3 TIMES WEEKLY. Hali Marry, MD Taking Active   guaifenesin (HUMIBID E) 400 MG TABS tablet [id number] Yes Take 400 mg by mouth every 6 (six) hours as needed. [provider] Taking Active   hydrochlorothiazide (HYDRODIURIL) 25 MG tablet [id number] Yes Take 1 tablet  by mouth daily. Hali Marry, MD Taking Active   hydrocortisone (ANUSOL-HC) 25 MG suppository [id number] Yes Place 25 mg rectally as needed. Reported on 04/19/2016 [provider] Taking Active   hydrocortisone 2.5 % cream [id number] Yes Apply topically as needed. Reported on 04/19/2016 [provider] Taking Active   ipratropium (ATROVENT) 0.06 % nasal spray [id number] Yes Place 2 sprays into both nostrils every 4 (four) hours as needed for rhinitis. Gregor Hams, MD Taking Active   Iron-Folic Acid-Vit Q91 (IRON FORMULA PO) [id number] Yes Take 1 Dose by mouth daily. In liquid form [provider] Taking Active   Lancets 30G MISC [id number] Yes DX DM E11.9 Check fasting blood sugar 2-3 times weekly. Hali Marry, MD Taking Active   losartan (COZAAR) 100 MG tablet [id number] Yes TAKE 1 TABLET BY MOUTH EVERY DAY Hali Marry, MD Taking Active   montelukast (SINGULAIR) 10 MG tablet [id number] Yes TAKE 1 TABLET BY MOUTH AT BEDTIME. Hali Marry, MD Taking Active   Multiple Vitamin (MULTIVITAMIN) tablet [id number] Yes Take 1 tablet by mouth daily. [provider] Taking  Active   Naftifine HCl 2 % CREA [id number]  Yes Apply 1 application topically 2 times a day Hali Marry, MD Taking Active   pantoprazole (PROTONIX) 40 MG tablet [id number] Yes Take 40 mg by mouth 2 (two) times daily. [provider] Taking Active   polycarbophil (FIBERCON) 625 MG tablet [id number] Yes Take 625 mg by mouth daily. [provider] Taking Active   polyethylene glycol (MIRALAX / GLYCOLAX) 17 g packet [id number] Yes Take 17 g by mouth daily. [provider] Taking Active   propranolol ER (INDERAL LA) 60 MG 24 hr capsule [id number] No Take 1 capsule (60 mg total) by mouth daily.  Patient not taking: Reported on 03/21/2022   Pieter Partridge, DO Not Taking Active   Propylene Glycol (SYSTANE BALANCE) 0.6 % SOLN [id number] Yes Apply 1 drop to eye daily. [provider] Taking Active   rosuvastatin (CRESTOR) 20 MG tablet [id number] Yes Take 1 tablet (20 mg total) by mouth daily. Aquilla, MD Taking Active   sertraline (ZOLOFT) 100 MG tablet [id number] Yes Take 2 tablets by mouth daily. = 211m total daily dose, [facility], MD Taking Active   tadalafil (CIALIS) 5 MG tablet [id number] Take 5 mg by mouth daily. [provider] Taking Active   tamsulosin (FLOMAX) 0.4 MG CAPS capsule [id number] Take 0.4 mg by mouth. [provider] Taking Active   tiotropium (SPIRIVA) 18 MCG inhalation capsule [id number] Place 18 mcg into inhaler and inhale daily. [provider] Taking Active   topiramate (TOPAMAX) 50 MG tablet [id number] TAKE 1 TABLET BY MOUTH TWICE A DAY MHali Marry MD Taking Active   TRAZODONE HCL PO [id number] Take 100 mg by mouth in the morning and at bedtime. [facility], Historical, MD Taking Active   triamcinolone ointment (KENALOG) 0.1 % [id number]  [provider] Taking Active   valACYclovir (VALTREX)  500 MG tablet [id number] TAKE 1 TABLET BY MOUTH EVERY DAY MHali Marry MD Taking Active             Patient Active Problem List   Diagnosis Date Noted   Tingling in extremities 10/04/2020   Tinnitus of both ears 10/04/2020   Deviated septum 10/04/2020   Low ferritin 08/04/2019   Delta beta thalassemia ([place] 04/16/2019   Asthma 03/10/2018   Primary osteoarthritis of right hand 01/30/2018   Migraine with aura and with status migrainosus, not intractable 11/21/2017   Recurrent genital herpes simplex 089/21/1941  Eosinophilic esophagitis 174/05/1447  Lumbar spondylosis with myelopathy 06/14/2017   OSA on CPAP 05/16/2017   Hearing loss 01/31/2017   Pulmonary nodule 01/31/2017   Kidney lesion, native, left 01/31/2017   Depression, recurrent ([place] 01/31/2017   Chronic cough 04/19/2016   Insomnia 11/18/2014   Gout 12/03/2013   Onychomycosis 12/03/2013   B12 deficiency 12/03/2013   Unspecified vitamin D deficiency 12/03/2013   Liver fibrosis 12/03/2013   Essential hypertension, benign 10/29/2013   History of hepatitis C virus infection 10/29/2013   GERD (gastroesophageal reflux disease) 10/29/2013   Hyperlipidemia 10/29/2013   BPH (benign prostatic hyperplasia) 10/29/2013   ED (erectile dysfunction) 10/29/2013   Hemorrhoid 10/29/2013   HSV antigen DIF positive 11/27/2011   Sciatic neuropathy 08/10/2011   Pain of lumbosacral spine 08/10/2011   Hepatitis C antibody test positive 08/10/2011    Immunization History  Administered Date(s) Administered   Fluad Quad(high Dose 65+) 08/28/2021   Influenza Split 09/03/2018   Influenza, High  Dose Seasonal PF 07/22/2020   Influenza,inj,Quad PF,6+ Mos 07/14/2014, 08/23/2015   Influenza-Unspecified 10/01/2011, 08/19/2012, 08/03/2013, 08/15/2016, 08/08/2017, 08/22/2018, 08/06/2019, 08/28/2021   PFIZER Comirnaty(Gray Top)Covid-19 Tri-Sucrose Vaccine 02/01/2021   PFIZER(Purple Top)SARS-COV-2 Vaccination 12/11/2019, 01/01/2020,  09/06/2020   Pneumococcal Conjugate-13 10/01/2016   Pneumococcal Polysaccharide-23 01/15/2012, 08/25/2015   Td (Adult) 12/12/2018   Tdap 10/22/2006, 11/18/2014   Zoster Recombinat (Shingrix) 04/18/2021, 06/22/2021   Zoster, Live 11/18/2014    Conditions to be addressed/monitored: HTN and HLD  There are no care plans that you recently modified to display for this patient.     Medication Assistance: None required.  Patient affirms current coverage meets needs.  Patient's preferred pharmacy is:  [id number] [place], [place] - [id number] [address]Phone: [phone]: [phone] [address] [place] [address]Phone: [phone]: [phone] [address] [place] [address]Phone: [phone]: [address]Phone: [phone]: [phone]  Uses pill box? No - pulls out of bottles one by one Pt endorses 100% compliance  Follow Up:  Patient agrees to Care Plan and Follow-up.  Plan: Face to Face appointment with care management team member scheduled for: 6 months  KLarinda Buttery PharmD Clinical Pharmacist CW[facility] At MBlue [facility] Asc [phone]

## 2022-06-27 NOTE — Patient Instructions (Signed)
Visit Information  Thank you for taking time to visit with me today. Please don't hesitate to contact me if I can be of assistance to you before our next scheduled telephone appointment.  Following are the goals we discussed today:   Patient Goals/Self-Care Activities Over the next 180 days, patient will:  - take medications as prescribed and check blood pressure 2-3x per week, document, and provide at future appointments - implement DASH diet, increase physical activity in order to progress toward goal of reducing blood pressure medications  Follow Up Plan: Face to face follow up appointment with care management team member scheduled for:  6 months   Please call the care guide team at [phone] if you need to cancel or reschedule your appointment.    Patient verbalizes understanding of instructions and care plan provided today and agrees to view in [place]. Active MyChart status and patient understanding of how to access instructions and care plan via MyChart confirmed with patient.     Brandon [name]

## 2022-06-28 LAB — BASIC METABOLIC PANEL
BUN: 14 (ref 4–21)
CO2: 27 — AB (ref 13–22)
Chloride: 104 (ref 99–108)
Creatinine: 1.3 (ref 0.6–1.3)
Potassium: 3.4 mEq/L — AB (ref 3.5–5.1)
Sodium: 137 (ref 137–147)

## 2022-06-28 LAB — CBC AND DIFFERENTIAL
HCT: 46 (ref 41–53)
Hemoglobin: 15.8 (ref 13.5–17.5)
Platelets: 149 10*3/uL — AB (ref 150–400)
WBC: 6.4

## 2022-06-28 LAB — PSA: PSA: 0.988

## 2022-06-28 LAB — HEPATIC FUNCTION PANEL
ALT: 40 U/L (ref 10–40)
AST: 24 (ref 14–40)
Alkaline Phosphatase: 91 (ref 25–125)
Bilirubin, Direct: 0.1
Bilirubin, Total: 0.4

## 2022-06-28 LAB — CBC: RBC: 5.99 — AB (ref 3.87–5.11)

## 2022-07-04 ENCOUNTER — Ambulatory Visit (INDEPENDENT_AMBULATORY_CARE_PROVIDER_SITE_OTHER): Payer: Medicare Other | Admitting: Family Medicine

## 2022-07-04 ENCOUNTER — Encounter: Payer: Self-pay | Admitting: Family Medicine

## 2022-07-04 VITALS — BP 138/70 | HR 74 | Wt 193.0 lb

## 2022-07-04 DIAGNOSIS — E785 Hyperlipidemia, unspecified: Secondary | ICD-10-CM

## 2022-07-04 DIAGNOSIS — N401 Enlarged prostate with lower urinary tract symptoms: Secondary | ICD-10-CM | POA: Diagnosis not present

## 2022-07-04 DIAGNOSIS — R3911 Hesitancy of micturition: Secondary | ICD-10-CM

## 2022-07-04 DIAGNOSIS — Z0000 Encounter for general adult medical examination without abnormal findings: Secondary | ICD-10-CM

## 2022-07-04 DIAGNOSIS — Z23 Encounter for immunization: Secondary | ICD-10-CM

## 2022-07-04 LAB — POCT UA - MICROALBUMIN
Creatinine, POC: 200 mg/dL
Microalbumin Ur, POC: 150 mg/L

## 2022-07-04 NOTE — Patient Instructions (Addendum)
  Mr. Attia , Thank you for taking time to come for your Medicare Wellness Visit. I appreciate your ongoing commitment to your health goals. Please review the following plan we discussed and let me know if I can assist you in the future.   These are the goals we discussed:  Goals      Medication Management     Patient Goals/Self-Care Activities Over the next 180 days, patient will:  - take medications as prescribed and check blood pressure 2-3x per week, document, and provide at future appointments - implement DASH diet, increase physical activity in order to progress toward goal of reducing blood pressure medications  Follow Up Plan: Face to face follow up appointment with care management team member scheduled for:  6 months      weight     Eats at least 3 full meals a day and snacks if needed. Continue to eat your fruits and vegetables. Loose weight at least 12 lbs in the year 2023-2024     Weight (lb) < 200 lb (90.7 kg)     Continue to work on getting down to 182 lbs.         This is a list of the screening recommended for you and due dates:  Health Maintenance  Topic Date Due   Colon Cancer Screening  08/04/2020   COVID-19 Vaccine (5 - Pfizer series) 03/29/2021   Flu Shot  01/20/2023*   Yearly kidney function blood test for diabetes  12/06/2022   Yearly kidney health urinalysis for diabetes  07/05/2023   Tetanus Vaccine  11/18/2024   Pneumonia Vaccine  Completed   Hepatitis C Screening: USPSTF Recommendation to screen - Ages 53-79 yo.  Completed   Zoster (Shingles) Vaccine  Completed   HPV Vaccine  Aged Out  *Topic was postponed. The date shown is not the original due date.

## 2022-07-04 NOTE — Progress Notes (Signed)
Subjective:   Brandon [name] is a 71 y.o. male who presents for Medicare Annual/Subsequent preventive examination.  Review of Systems    Negative  Cardiac Risk Factors include: male gender;advanced age (>37mn, >>7women);obesity (BMI >30kg/m2)     Objective:    Today's Vitals   07/04/22 0934  BP: 138/70  Pulse: 74  SpO2: 99%  Weight: 193 lb (87.5 kg)   Body mass index is 30.23 kg/m.     01/10/2022    1:35 PM 12/21/2020    7:55 AM 12/28/2019    9:04 AM 04/16/2019   11:50 AM 12/23/2018    9:15 AM  Advanced Directives  Does Patient Have a Medical Advance Directive? _0   Would patient like information on creating a medical advance directive?   No - Patient declined No - Patient declined Yes (MAU/Ambulatory/Procedural Areas - Information given)    Current Medications (verified) Outpatient Encounter Medications as of 07/04/2022  Medication Sig   albuterol (PROAIR HFA) 108 (90 Base) MCG/ACT inhaler Inhale 2 puffs into the lungs every 6 (six) hours as needed for wheezing or shortness of breath.   Alum Hydroxide-Mag Carbonate (ACID GONE ANTACID PO) Take 1 tablet by mouth daily as needed.   Black Elderberry 50 MG/5ML SYRP Take 1 Dose by mouth at bedtime.   Blood Glucose Monitoring Suppl (ONE TOUCH ULTRA 2) w/Device KIT DX DM E11.9 Check fasting blood sugar 2-3 times weekly.   carboxymethylcellulose (REFRESH PLUS) 0.5 % SOLN 1 drop 3 (three) times daily as needed.   cetirizine (ZYRTEC) 10 MG tablet TAKE 1 TABLET BY MOUTH EVERY DAY   Cholecalciferol (VITAMIN D3 PO) Take 1 tablet by mouth daily.   clindamycin (CLEOCIN T) 1 % lotion Apply topically 2 (two) times daily.   clindamycin (CLEOCIN T) 1 % SWAB    clotrimazole-betamethasone (LOTRISONE) cream Apply topically 2 (two) times daily.   cyclobenzaprine (FLEXERIL) 10 MG tablet Take 10 mg by mouth 3 (three) times daily as needed.   diclofenac sodium (VOLTAREN) 1 % GEL Apply 2 g topically 4 (four) times daily. To affected  areas   fluticasone (FLONASE) 50 MCG/ACT nasal spray Place 2 sprays into both nostrils daily.   glucose blood (ONETOUCH ULTRA) test strip CHECK FASTING BLOOD SUGAR 2-3 TIMES WEEKLY.   guaifenesin (HUMIBID E) 400 MG TABS tablet Take 400 mg by mouth every 6 (six) hours as needed.   hydrochlorothiazide (HYDRODIURIL) 25 MG tablet Take 1 tablet  by mouth daily.   hydrocortisone (ANUSOL-HC) 25 MG suppository Place 25 mg rectally as needed. Reported on 04/19/2016   hydrocortisone 2.5 % cream Apply topically as needed. Reported on 04/19/2016   ipratropium (ATROVENT) 0.06 % nasal spray Place 2 sprays into both nostrils every 4 (four) hours as needed for rhinitis.   Iron-Folic Acid-Vit BI35(IRON FORMULA PO) Take 1 Dose by mouth daily. In liquid form   Lancets 30G MISC DX DM E11.9 Check fasting blood sugar 2-3 times weekly.   losartan (COZAAR) 100 MG tablet TAKE 1 TABLET BY MOUTH EVERY DAY   montelukast (SINGULAIR) 10 MG tablet TAKE 1 TABLET BY MOUTH AT BEDTIME.   Multiple Vitamin (MULTIVITAMIN) tablet Take 1 tablet by mouth daily.   Naftifine HCl 2 % CREA Apply 1 application topically 2 times a day   pantoprazole (PROTONIX) 40 MG tablet Take 40 mg by mouth 2 (two) times daily.   polycarbophil (FIBERCON) 625 MG tablet Take 625 mg by mouth daily.   polyethylene glycol (MIRALAX /  GLYCOLAX) 17 g packet Take 17 g by mouth daily.   propranolol ER (INDERAL LA) 60 MG 24 hr capsule Take 1 capsule (60 mg total) by mouth daily.   Propylene Glycol (SYSTANE BALANCE) 0.6 % SOLN Apply 1 drop to eye daily.   rosuvastatin (CRESTOR) 20 MG tablet Take 1 tablet (20 mg total) by mouth daily. [phone]   sertraline (ZOLOFT) 100 MG tablet Take 2 tablets by mouth daily. = 270m total daily dose, [facility]  tadalafil (CIALIS) 5 MG tablet Take 5 mg by mouth daily.   tamsulosin (FLOMAX) 0.4 MG CAPS capsule Take 0.4 mg by mouth.   tiotropium (SPIRIVA) 18 MCG inhalation capsule Place 18 mcg into inhaler and  inhale daily.   topiramate (TOPAMAX) 50 MG tablet Take 1 tablet (50 mg total) by mouth 2 (two) times daily. Keep anointment on 06/27/2022   TRAZODONE HCL PO Take 100 mg by mouth in the morning and at bedtime. [facility]  triamcinolone ointment (KENALOG) 0.1 %    valACYclovir (VALTREX) 500 MG tablet TAKE 1 TABLET BY MOUTH EVERY DAY   No facility-administered encounter medications on file as of 07/04/2022.    Allergies (verified) Amlodipine and Atorvastatin   History: Past Medical History:  Diagnosis Date   ED (erectile dysfunction)    GERD (gastroesophageal reflux disease)    Hepatitis C    [phone] [facility]  Hyperlipidemia    Hypertension    Nose fracture    Past Surgical History:  Procedure Laterality Date   FOOT SURGERY Right    1980   FOOT SURGERY Left    1980   INGUINAL HERNIA REPAIR Right    2009   Family History  Problem Relation Age of Onset   Breast cancer Sister    Depression Sister    Hyperlipidemia Other    Ovarian cancer Sister    Hypertension Other    Heart disease Brother    Social History   Socioeconomic History   Marital status: Married    Spouse name: ABrook Mall  Number of children: 4   Years of education: 14   Highest education level: Associate degree: academic program  Occupational History   Occupation: [place] Tobacco Use   Smoking status: Former    Packs/day: 1.50    Years: 15.00    Total pack years: 22.50    Types: Cigarettes    Quit date: 10/22/1986    Years since quitting: 35.7   Smokeless tobacco: Never   Tobacco comments:    SmokE off and on  Vaping Use   Vaping Use: Never used  Substance and Sexual Activity   Alcohol use: No   Drug use: No   Sexual activity: Yes    Partners: Female  Other Topics Concern   Not on file  Social History Narrative   No caffeine.  Has a 2 years associate degree. Previously worked at [employer] 12 years.  Also work to [employer]   Right handed   Social Determinants of Health   Financial Resource Strain: Low Risk  (12/23/2018)   Overall Financial Resource Strain (CARDIA)    Difficulty of Paying Living Expenses: Not hard at all  Food Insecurity: No Food Insecurity (12/23/2018)   Hunger Vital Sign    Worried About Running Out of Food in the Last Year: Never true    Ran Out of Food  in the Last Year: Never true  Transportation Needs: No Transportation Needs (12/23/2018)   PRAPARE - Hydrologist (Medical): No    Lack of Transportation (Non-Medical): No  Physical Activity: Inactive (12/23/2018)   Exercise Vital Sign    Days of Exercise per Week: 0 days    Minutes of Exercise per Session: 0 min  Stress: No Stress Concern Present (12/23/2018)   [place]    Feeling of Stress : Not at all  Social Connections: Somewhat Isolated (12/23/2018)   Social Connection and Isolation Panel [NHANES]    Frequency of Communication with Friends and Family: Once a week    Frequency of Social Gatherings with Friends and Family: Never    Attends Religious Services: More than 4 times per year    Active Member of [employer] or Organizations: No    Attends Music therapist: Never    Marital Status: Married    Tobacco Counseling Counseling given: Not Answered Tobacco comments: SmokE off and on   Clinical Intake:  Pre-visit preparation completed: Yes  Pain : No/denies pain     BMI - recorded: 30 Nutritional Risks: None Diabetes: No     Diabetic?NO  Interpreter Needed?: No      Activities of Daily Living    07/04/2022   12:14 PM  In your present state of health, do you have any difficulty performing the following activities:  Hearing? 1  Comment has hearing aids  Vision? 0  Difficulty concentrating or making decisions? 1  Walking or climbing stairs? 0  Dressing or bathing? 0  Doing errands,  shopping? 0  Preparing Food and eating ? N  Using the Toilet? N  In the past six months, have you accidently leaked urine? Y  Do you have problems with loss of bowel control? N  Managing your Medications? N  Managing your Finances? N  Housekeeping or managing your Housekeeping? N    Patient Care Team: Hali Marry, MD as PCP - General (Family Medicine) Hali Marry, MD as Consulting Physician (Family Medicine) [name], [facility] as Pharmacist (Pharmacist)  Indicate any recent Medical Services you may have received from other than [facility] providers in the past year (date may be approximate).     Assessment:   This is a routine wellness examination for Davonne.  Hearing/Vision screen No results found.  Dietary issues and exercise activities discussed:     Goals Addressed             This Visit's Progress    weight       Eats at least 3 full meals a day and snacks if needed. Continue to eat your fruits and vegetables. Loose weight at least 12 lbs in the year 2023-2024     Weight (lb) < 200 lb (90.7 kg)   193 lb (87.5 kg)    Continue to work on getting down to 182 lbs.        Depression Screen    12/28/2019    9:06 AM 07/14/2019   11:40 AM 01/02/2019    2:47 PM 12/23/2018    9:21 AM 01/30/2018   10:44 AM 08/02/2017    5:06 PM 01/31/2017    1:48 PM  PHQ 2/9 Scores  PHQ - 2 Score _0 PHQ- 9 Score  _1 Fall Risk    01/10/2022  1:34 PM 12/21/2020    7:55 AM 12/28/2019    9:05 AM 08/04/2019    8:34 AM 07/14/2019   11:40 AM  Fall Risk   Falls in the past year? 0 0 0 0 0  Number falls in past yr: 0 0  0 0  Injury with Fall? 0 0  0 0  Risk for fall due to :   No Fall Risks    Follow up   Falls prevention discussed      FALL RISK PREVENTION PERTAINING TO THE HOME:  No recent falls.   ASSISTIVE DEVICES UTILIZED TO PREVENT FALLS:  Life alert? No  Use of a cane, walker or w/c? No  Grab bars in the bathroom? No  Shower chair  or bench in shower? No  Elevated toilet seat or a handicapped toilet? No   TIMED UP AND GO:  Was the test performed? No .    Gait steady and fast without use of assistive device  Cognitive Function:        07/04/2022   10:18 AM 12/28/2019    9:10 AM 12/23/2018    9:25 AM  6CIT Screen  What Year? 0 points 0 points 0 points  What month? 0 points 0 points 0 points  What time? 0 points 0 points 0 points  Count back from 20 0 points 0 points 0 points  Months in reverse 0 points 0 points 0 points  Repeat phrase 0 points 4 points 2 points  Total Score 0 points 4 points 2 points    Immunizations Immunization History  Administered Date(s) Administered   Fluad Quad(high Dose 65+) 08/28/2021   Influenza Split 09/03/2018   Influenza, High Dose Seasonal PF 07/22/2020   Influenza,inj,Quad PF,6+ Mos 07/14/2014, 08/23/2015   Influenza-Unspecified 10/01/2011, 08/19/2012, 08/03/2013, 08/15/2016, 08/08/2017, 08/22/2018, 08/06/2019, 08/28/2021   PFIZER Comirnaty(Gray Top)Covid-19 Tri-Sucrose Vaccine 02/01/2021   PFIZER(Purple Top)SARS-COV-2 Vaccination 12/11/2019, 01/01/2020, 09/06/2020   PNEUMOCOCCAL CONJUGATE-20 07/04/2022   Pneumococcal Conjugate-13 10/01/2016   Pneumococcal Polysaccharide-23 01/15/2012, 08/25/2015   Td (Adult) 12/12/2018   Tdap 10/22/2006, 11/18/2014   Zoster Recombinat (Shingrix) 04/18/2021, 06/22/2021   Zoster, Live 11/18/2014    TDAP status: Up to date  Flu Vaccine status: Due, Education has been provided regarding the importance of this vaccine. Advised may receive this vaccine at local pharmacy or Health Dept. Aware to provide a copy of the vaccination record if obtained from local pharmacy or Health Dept. Verbalized acceptance and understanding.  Pneumococcal vaccine status: Completed during today's visit.   Qualifies for Shingles Vaccine? No   Shingrix Completed?: Yes  Screening Tests Health Maintenance  Topic Date Due   COLONOSCOPY (Pts 45-42yr  Insurance coverage will need to be confirmed)  08/04/2020   COVID-19 Vaccine (5 - Pfizer series) 03/29/2021   INFLUENZA VACCINE  01/20/2023 (Originally 05/22/2022)   TETANUS/TDAP  11/18/2024   Pneumonia Vaccine 71 Years old  Completed   Hepatitis C Screening  Completed   Zoster Vaccines- Shingrix  Completed   HPV VACCINES  Aged Out    Health Maintenance  Health Maintenance Due  Topic Date Due   COLONOSCOPY (Pts 45-485yrInsurance coverage will need to be confirmed)  08/04/2020   COVID-19 Vaccine (5 - Pfizer series) 03/29/2021    CRS - up to date.    Lung Cancer Screening: (Low Dose CT Chest recommended if Age 72-80ears, 30 pack-year currently smoking OR have quit w/in 15years.) does not qualify.   Lung Cancer Screening Referral: NA  Additional Screening:  Hepatitis C Screening: does not qualify; Completed 2017  Vision Screening: Recommended annual ophthalmology exams for early detection of glaucoma and other disorders of the eye. Is the patient up to date with their annual eye exam?  No  Who is the provider or [facility] the [facility]  Dental Screening: Recommended annual dental exams for proper oral hygiene  Community Resource Referral / Chronic Care Management: CRR required this visit?  No   CCM required this visit?  No      Plan:     I have personally reviewed and noted the following in the patient's chart:   Medical and social history Use of alcohol, tobacco or illicit drugs  Current medications and supplements including opioid prescriptions. Patient is not currently taking opioid prescriptions. Functional ability and status Nutritional status Physical activity Advanced directives List of other physicians Hospitalizations, surgeries, and ER visits in previous 12 months Vitals Screenings to include cognitive, depression, and falls Referrals and appointments Encouraged him to speak with his urologist  about the occasional urine leakage.  He has not discussed it with him previously so encouraged him to talk about it at his next appointment.  Also encouraged him to schedule an eye exam.  In addition, I have reviewed and discussed with patient certain preventive protocols, quality metrics, and best practice recommendations. A written personalized care plan for preventive services as well as general preventive health recommendations were provided to patient.     Beatrice Lecher, MD   07/04/2022   Nurse Notes:

## 2022-07-05 ENCOUNTER — Encounter: Payer: Self-pay | Admitting: Pulmonary Disease

## 2022-07-05 ENCOUNTER — Ambulatory Visit (INDEPENDENT_AMBULATORY_CARE_PROVIDER_SITE_OTHER): Payer: Medicare Other | Admitting: Pulmonary Disease

## 2022-07-05 VITALS — BP 122/64 | HR 65 | Ht 67.0 in | Wt 193.0 lb

## 2022-07-05 DIAGNOSIS — J4530 Mild persistent asthma, uncomplicated: Secondary | ICD-10-CM

## 2022-07-05 DIAGNOSIS — G4733 Obstructive sleep apnea (adult) (pediatric): Secondary | ICD-10-CM

## 2022-07-05 DIAGNOSIS — J342 Deviated nasal septum: Secondary | ICD-10-CM

## 2022-07-05 DIAGNOSIS — Z9989 Dependence on other enabling machines and devices: Secondary | ICD-10-CM

## 2022-07-05 DIAGNOSIS — J310 Chronic rhinitis: Secondary | ICD-10-CM | POA: Diagnosis not present

## 2022-07-05 MED ORDER — AZELASTINE HCL 0.1 % NA SOLN: 1.0000 | Freq: Every morning | NASAL | 12 refills | Status: AC

## 2022-07-05 NOTE — Patient Instructions (Signed)
Use saline sinus rinse daily  Flonase 1 spray in each nostril nightly  Try using azelastine 1 spray in each nostril daily in the morning  Ipratropium nose spray as needed when you have more of a runny nose  Talk to the team at the [facility] about getting our CPAP mask refit  Follow up in 1 year

## 2022-07-05 NOTE — Progress Notes (Signed)
[facility]  Chief Complaint  Patient presents with   Follow-up    Cpap compliance not wearing it like he use too. Thinks not enough pressure    Constitutional:  BP 122/64 (BP Location: Left Arm, Cuff Size: Normal)   Pulse 65   Ht 5' 7"  (1.702 m)   Wt 193 lb (87.5 kg)   SpO2 99%   BMI 30.23 kg/m   Past Medical History:  ED, GERD, Hep C, HLD, HTN, BPH, Lumbar spinal stenosis, Delta beta thalassemia  Past Surgical History:  He  has a past surgical history that includes Foot surgery (Right); Foot surgery (Left); and Inguinal hernia repair (Right).  Brief Summary:  Brandon [name] is a 71 y.o. male former smoker with asthma, chronic rhinitis with deviated nasal septum, and obstructive sleep apnea.      Subjective:   He is here with his wife.  He was ENT last year.  Was advised about surgery, but he is reluctant to do this.  His wife has sinus surgery, and didn't have a pleasant experience.  He gets sinus pressure and headaches.  Using flonase and nasal irrigation.  Uses ipratropium intermittently.  Breathing okay.  Not having cough, wheeze, or sputum.  Spiriva and singulair help.  Uses CPAP nightly.  Feels like he is not getting enough pressure at times and has to pull his mask off to breath.  His wife says she hears his mask leak right before he does this.  Physical Exam:   Appearance - well kempt   ENMT - no sinus tenderness, no oral exudate, no LAN, Mallampati 4 airway, no stridor, deviated septum  Respiratory - equal breath sounds bilaterally, no wheezing or rales  CV - s1s2 regular rate and rhythm, no murmurs  Ext - no clubbing, no edema  Skin - no rashes  Psych - normal mood and affect    Pulmonary testing:  Spirometry 02/14/18 >> FVC 70%, FEV1 73%, FEV1% 77 RAST 02/20/18 >> negative, IgE 15 PFT 03/10/18 >> FEV1 2.47 (93%), FEV1% 83, FEF 25-75% 1.78 (75%), TLC 5.15 (79%), DLCO 71%, no BD  Chest Imaging:   CT chest 01/18/17 >> calcified granulomas, 5 mm nodule LUL, 5 mm nodule LLL, centrilobular nodularity RML CT chest 03/07/18 >> scattered nodules up to 6 mm CT chest 09/10/18 >> 6 mm subpleural nodule LLL, 6 mm nodule RML no change CT chest 09/09/19 >> few nodules up to 6 mm in RML no change  Sleep Tests:  ONO with RA 03/06/18 >> test time 7 hrs 25 min.  Average SpO2 96%, low SpO2 92%.   CPAP 06/04/22 to 07/03/22 >> used on 30 of 30 nights with average 8 hrs 30 min.  Average AHI 6.6 with CPAP 12 cm H2O.  Air leak.  Social History:  He  reports that he quit smoking about 35 years ago. His smoking use included cigarettes. He has a 22.50 pack-year smoking history. He has never used smokeless tobacco. He reports that he does not drink alcohol and does not use drugs.  Family History:  His family history includes Breast cancer in his sister; Depression in his sister; Heart disease in his brother; Hyperlipidemia in an other family member; Hypertension in an other family member; Ovarian cancer in his sister.     Assessment/Plan:   Mild, persistent asthma. - symbicort wasn't effective - continue spiriva, singulair - prn albuterol - gets medications from the [place] - he will get his flu shot  from his PCP's office   Upper airway cough from chronic rhinitis with deviated nasal septum. - try adding azelastine daily in the morning - continue nasal irrigation, flonase at night - prn zyrtec, ipratropium - he was seen by Dr. Melony Overly with ENT in January 2022  Obstructive sleep apnea. - he is compliant with CPAP and reports benefit from therapy - he gets supplies from the [place] - seems like current issue is related to mask fit; he will get mask refit at the [place] later this month - if he continue to have difficulty, then could increase CPAP setting to 13 or 14 cm H2O  Time Spent Involved in Patient Care on Day of Examination:  35 minutes  Follow up:   There are no Patient Instructions on file  for this visit.  Medication List:   Allergies as of 07/05/2022       Reactions   Amlodipine Rash   Atorvastatin Other (See Comments)   Myalgias        Medication List        Accurate as of July 05, 2022  9:31 AM. If you have any questions, ask your nurse or doctor.          STOP taking these medications    cyclobenzaprine 10 MG tablet Commonly known as: FLEXERIL Stopped by: Chesley Mires, MD   IRON FORMULA PO Stopped by: Chesley Mires, MD   multivitamin tablet Stopped by: Chesley Mires, MD       TAKE these medications    ACID GONE ANTACID PO Take 1 tablet by mouth daily as needed.   albuterol 108 (90 Base) MCG/ACT inhaler Commonly known as: ProAir HFA Inhale 2 puffs into the lungs every 6 (six) hours as needed for wheezing or shortness of breath.   Black Elderberry 50 MG/5ML Syrp Take 1 Dose by mouth at bedtime.   carboxymethylcellulose 0.5 % Soln Commonly known as: REFRESH PLUS 1 drop 3 (three) times daily as needed.   cetirizine 10 MG tablet Commonly known as: ZYRTEC TAKE 1 TABLET BY MOUTH EVERY DAY   clindamycin 1 % lotion Commonly known as: CLEOCIN T Apply topically 2 (two) times daily.   clindamycin 1 % Swab Commonly known as: CLEOCIN T   clotrimazole-betamethasone cream Commonly known as: LOTRISONE Apply topically 2 (two) times daily.   diclofenac sodium 1 % Gel Commonly known as: VOLTAREN Apply 2 g topically 4 (four) times daily. To affected areas   fluticasone 50 MCG/ACT nasal spray Commonly known as: FLONASE Place 2 sprays into both nostrils daily.   hydrochlorothiazide 25 MG tablet Commonly known as: HYDRODIURIL Take 1 tablet  by mouth daily.   hydrocortisone 2.5 % cream Apply topically as needed. Reported on 04/19/2016   hydrocortisone 25 MG suppository Commonly known as: ANUSOL-HC Place 25 mg rectally as needed. Reported on 04/19/2016   ipratropium 0.06 % nasal spray Commonly known as: Atrovent Place 2 sprays into both  nostrils every 4 (four) hours as needed for rhinitis.   Lancets 30G Misc DX DM E11.9 Check fasting blood sugar 2-3 times weekly.   losartan 100 MG tablet Commonly known as: COZAAR TAKE 1 TABLET BY MOUTH EVERY DAY   montelukast 10 MG tablet Commonly known as: SINGULAIR TAKE 1 TABLET BY MOUTH AT BEDTIME.   Naftifine HCl 2 % Crea Apply 1 application topically 2 times a day   ONE TOUCH ULTRA 2 w/Device Kit DX DM E11.9 Check fasting blood sugar 2-3 times weekly.   OneTouch Ultra test  strip Generic drug: glucose blood CHECK FASTING BLOOD SUGAR 2-3 TIMES WEEKLY.   pantoprazole 40 MG tablet Commonly known as: PROTONIX Take 40 mg by mouth 2 (two) times daily.   polycarbophil 625 MG tablet Commonly known as: FIBERCON Take 625 mg by mouth daily.   polyethylene glycol 17 g packet Commonly known as: MIRALAX / GLYCOLAX Take 17 g by mouth daily.   propranolol ER 60 MG 24 hr capsule Commonly known as: Inderal LA Take 1 capsule (60 mg total) by mouth daily.   rosuvastatin 20 MG tablet Commonly known as: CRESTOR Take 1 tablet (20 mg total) by mouth daily. [phone]   sertraline 100 MG tablet Commonly known as: ZOLOFT Take 2 tablets by mouth daily. = 246m total daily dose, [facility] Soln Generic drug: Propylene Glycol Apply 1 drop to eye daily.   tadalafil 5 MG tablet Commonly known as: CIALIS Take 5 mg by mouth daily.   tamsulosin 0.4 MG Caps capsule Commonly known as: FLOMAX Take 0.4 mg by mouth.   tiotropium 18 MCG inhalation capsule Commonly known as: SPIRIVA Place 18 mcg into inhaler and inhale daily.   topiramate 50 MG tablet Commonly known as: TOPAMAX Take 1 tablet (50 mg total) by mouth 2 (two) times daily. Keep anointment on 06/27/2022   TRAZODONE HCL PO Take 100 mg by mouth in the morning and at bedtime. [facility]  valACYclovir 500 MG tablet Commonly known as: VALTREX TAKE 1 TABLET BY MOUTH  EVERY DAY   VITAMIN D3 PO Take 1 tablet by mouth daily.        Signature:  VChesley Mires MD LStellaPager - [phone]/14/2023, 9:31 AM

## 2022-07-16 ENCOUNTER — Ambulatory Visit (INDEPENDENT_AMBULATORY_CARE_PROVIDER_SITE_OTHER): Payer: Medicare Other | Admitting: Family Medicine

## 2022-07-16 DIAGNOSIS — Z23 Encounter for immunization: Secondary | ICD-10-CM

## 2022-07-21 DIAGNOSIS — I10 Essential (primary) hypertension: Secondary | ICD-10-CM

## 2022-07-21 DIAGNOSIS — E785 Hyperlipidemia, unspecified: Secondary | ICD-10-CM

## 2022-08-07 ENCOUNTER — Telehealth: Payer: Self-pay

## 2022-08-07 NOTE — Patient Outreach (Signed)
  Care Coordination   Initial Visit Note   08/07/2022 Name: Starling Christofferson MRN: [id number] DOB: 11-05-1950  Kaeden Mester is a 71 y.o. year old male who sees Metheney, Rene Kocher, MD for primary care. I spoke with  [name] by phone today.  What matters to the patients health and wellness today?  Patient reports he is seen also at the [facility] in [place] and [place] where he is being followed for back pain. He reports he is interested in information about Prediabetes.   Goals Addressed             This Visit's Progress    Health management-Prediabetes education       Care Coordination Interventions: Discussed care coordination program Reviewed recommendations from last primary care provider on 07/04/22 Provided prediabetes information via mail and with after visit summary Encouraged to attend provider visits as scheduled Encouraged to contact provider with health questions or concerns       SDOH assessments and interventions completed:  Yes  SDOH Interventions Today    Flowsheet Row Most Recent Value  SDOH Interventions   Food Insecurity Interventions Intervention Not Indicated  Housing Interventions Intervention Not Indicated  Transportation Interventions Intervention Not Indicated  Utilities Interventions Intervention Not Indicated     Care Coordination Interventions Activated:  Yes  Care Coordination Interventions:  Yes, provided   Follow up plan: No further intervention required. Follow up call scheduled for 08/29/22    Encounter Outcome:  Pt. Visit Completed   Thea Silversmith, RN, MSN, BSN, [place] Coordinator [phone]

## 2022-08-07 NOTE — Patient Instructions (Addendum)
Visit Information  Thank you for taking time to visit with me today. Please don't hesitate to contact me if I can be of assistance to you.   Following are the goals we discussed today:   Goals Addressed             This Visit's Progress    Health management-Prediabetes education       Care Coordination Interventions: Discussed care coordination program Reviewed recommendations from last primary care provider on 07/04/22 Provided prediabetes information via mail and with after visit summary Encouraged to attend provider visits as scheduled Encouraged to contact provider with health questions or concerns       Our next appointment is by telephone on 08/29/22 at 10:30 am  Please call the care guide team at [phone] if you need to cancel or reschedule your appointment.   If you are experiencing a Mental Health or [place] or need someone to talk to, please call the Suicide and Crisis Lifeline: 988  Patient verbalizes understanding of instructions and care plan provided today and agrees to view in [place]. Active MyChart status and patient understanding of how to access instructions and care plan via MyChart confirmed with patient.     Thea Silversmith, RN, MSN, BSN, [place] Coordinator [phone]

## 2022-08-25 ENCOUNTER — Other Ambulatory Visit: Payer: Self-pay | Admitting: Family Medicine

## 2022-08-25 DIAGNOSIS — A6000 Herpesviral infection of urogenital system, unspecified: Secondary | ICD-10-CM

## 2022-08-27 DIAGNOSIS — M4316 Spondylolisthesis, lumbar region: Secondary | ICD-10-CM | POA: Diagnosis not present

## 2022-08-27 DIAGNOSIS — M47816 Spondylosis without myelopathy or radiculopathy, lumbar region: Secondary | ICD-10-CM | POA: Diagnosis not present

## 2022-08-27 DIAGNOSIS — M5137 Other intervertebral disc degeneration, lumbosacral region: Secondary | ICD-10-CM | POA: Diagnosis not present

## 2022-08-27 DIAGNOSIS — M4807 Spinal stenosis, lumbosacral region: Secondary | ICD-10-CM | POA: Diagnosis not present

## 2022-08-27 DIAGNOSIS — M48061 Spinal stenosis, lumbar region without neurogenic claudication: Secondary | ICD-10-CM | POA: Diagnosis not present

## 2022-08-27 DIAGNOSIS — M5136 Other intervertebral disc degeneration, lumbar region: Secondary | ICD-10-CM | POA: Diagnosis not present

## 2022-08-27 DIAGNOSIS — M47817 Spondylosis without myelopathy or radiculopathy, lumbosacral region: Secondary | ICD-10-CM | POA: Diagnosis not present

## 2022-08-29 ENCOUNTER — Telehealth: Payer: Self-pay

## 2022-08-29 ENCOUNTER — Ambulatory Visit: Payer: Self-pay

## 2022-08-29 NOTE — Patient Instructions (Signed)
Visit Information  Thank you for taking time to visit with me today. Please don't hesitate to contact me if I can be of assistance to you.   Following are the goals we discussed today:   Goals Addressed             This Visit's Progress    Health Management       Care Coordination Interventions: Discussed back pain and non pharmacological remedies Encouraged to take medications as prescribed Encouraged to follow up with provider if condition does not improve or worsens     COMPLETED: Health management-Prediabetes education       Care Coordination Interventions: Confirmed patient has received prediabetes education and no questions Encouraged to continue to eat healthy      Our next appointment is by telephone on 09/25/22 at 10:30  Please call the care guide team at [phone] if you need to cancel or reschedule your appointment.   If you are experiencing a Mental Health or [place] or need someone to talk to, please call the Suicide and Crisis Lifeline: 988  Patient verbalizes understanding of instructions and care plan provided today and agrees to view in [place]. Active MyChart status and patient understanding of how to access instructions and care plan via MyChart confirmed with patient.     Thea Silversmith, RN, MSN, BSN, [place] Coordinator [phone]

## 2022-08-29 NOTE — Patient Outreach (Signed)
  Care Coordination   Follow Up Visit Note   08/29/2022 Name: Brandon [name] MRN: [id number] DOB: [birth year]  Brandon [name] is a 71 y.o. year old male who sees Metheney, Rene Kocher, MD for primary care. I spoke with  Brandon [name] by phone today.  What matters to the patients health and wellness today?  Brandon [name] reports he received the prediabetes education sent previously via mail. He denies any questions and reports it was very helpful. He reports primary concern at this time is his back pain. He states it is being managed by the [facility]. He reports an MRI completed on yesterday and states he is awaiting a return call regarding results. He reports pain is a "12 on the pain scale of 0-10 at this time. He states he has medications including muscle relaxants, but has not taken any medication yet. He reports it helps to relieve pain and will decrease the pain down to a level "5", when he takes the medication. Brandon [name] states he will take his medication once he gets off the phone. He is without any other questions or concerns at this time.   Goals Addressed             This Visit's Progress    Health Management       Care Coordination Interventions: Discussed back pain and non pharmacological remedies Encouraged to take medications as prescribed Encouraged to follow up with provider if condition does not improve or worsens     COMPLETED: Health management-Prediabetes education       Care Coordination Interventions: Confirmed patient has received prediabetes education and no questions Encouraged to continue to eat healthy      SDOH assessments and interventions completed:  No  Care Coordination Interventions Activated:  Yes  Care Coordination Interventions:  Yes, provided  12/ Follow up plan: Follow up call scheduled for 09/25/22    Encounter Outcome:  Pt. Visit Completed   Thea Silversmith, RN, MSN, BSN, [place]  Coordinator [phone]

## 2022-08-29 NOTE — Patient Outreach (Signed)
  Care Coordination   08/29/2022 Name: Brandon [name] MRN: [id number] DOB: 1950/11/26   Care Coordination Outreach Attempts:  An unsuccessful telephone outreach was attempted for a scheduled appointment today.  Follow Up Plan:  Additional outreach attempts will be made to offer the patient care coordination information and services.   Encounter Outcome:  No Answer  Care Coordination Interventions Activated:  No   Care Coordination Interventions:  No, not indicated    Thea Silversmith, RN, MSN, BSN, [place] Coordinator [phone]

## 2022-09-06 ENCOUNTER — Ambulatory Visit (INDEPENDENT_AMBULATORY_CARE_PROVIDER_SITE_OTHER): Payer: Medicare Other | Admitting: Family Medicine

## 2022-09-06 ENCOUNTER — Encounter: Payer: Self-pay | Admitting: Family Medicine

## 2022-09-06 VITALS — BP 114/57 | HR 70 | Ht 67.0 in | Wt 191.0 lb

## 2022-09-06 DIAGNOSIS — M48061 Spinal stenosis, lumbar region without neurogenic claudication: Secondary | ICD-10-CM

## 2022-09-06 DIAGNOSIS — R7989 Other specified abnormal findings of blood chemistry: Secondary | ICD-10-CM | POA: Diagnosis not present

## 2022-09-06 DIAGNOSIS — R809 Proteinuria, unspecified: Secondary | ICD-10-CM

## 2022-09-06 NOTE — Patient Instructions (Signed)
Go for labs and urine in about 2 weeks.

## 2022-09-06 NOTE — Progress Notes (Addendum)
Established Patient Office Visit  Subjective   Patient ID: Brandon [name], male    DOB: 1951-02-14  Age: 70 y.o. MRN: [id number]  Chief Complaint  Patient presents with   Results    HPI Hi is here today to discuss recent kidney results.  He had a recent blood test done at the [place].  Serum creatinine was up little bit at 1.27 which is above his baseline.  Urine microalbumin was also positive for 3-30 value.  He has not noticed any specific changes.  Though he did have a couple days earlier this week where he noticed he was having a little bit more difficulty urinating.  He is also been having a lot of issues with his back and spine and in fact just had a recent MRI done at the [place] and brought in a copy of his results.  They are referring him to a spine surgeon.  Lab Results  Component Value Date   CREATININE 1.15 12/06/2021   CREATININE 1.12 07/11/2021   CREATININE 1.15 02/04/2020       ROS    Objective:     BP (!) 114/57   Pulse 70   Ht '5\' 7"'$  (1.702 m)   Wt 191 lb (86.6 kg)   SpO2 98%   BMI 29.91 kg/m    Physical Exam Constitutional:      Appearance: He is well-developed.  HENT:     Head: Normocephalic and atraumatic.  Cardiovascular:     Rate and Rhythm: Normal rate and regular rhythm.     Heart sounds: Normal heart sounds.  Pulmonary:     Effort: Pulmonary effort is normal.     Breath sounds: Normal breath sounds.  Skin:    General: Skin is warm and dry.  Neurological:     Mental Status: He is alert and oriented to person, place, and time.  Psychiatric:        Behavior: Behavior normal.      No results found for any visits on 09/06/22.    The ASCVD Risk score (Arnett DK, et al., 2019) failed to calculate for the following reasons:   The valid total cholesterol range is 130 to 320 mg/dL    Assessment & Plan:   Problem List Items Addressed This Visit       Other   Spinal stenosis of lumbar region    He did bring in a copy of his MRI of his  lumbar spine without contrast that showed grade 1 spondylolisthesis L4-L5 with moderately severe spinal stenosis and moderate bilateral foraminal stenosis worse than on prior study.  Also showed moderate spinal stenosis and moderate bilateral foraminal stenosis at L3-4 worse than prior exam.  Bilateral subarticular stenosis and mild bilateral foraminal stenosis L5-S1.  They are planning to refer him to a neurosurgeon for further evaluation.  He is finishing up a course of steroids and is feeling some better.      Other Visit Diagnoses     Positive for microalbuminuria    -  Primary   Relevant Orders   BASIC METABOLIC PANEL WITH GFR   Urine Microalbumin w/creat. ratio   Hemoglobin A1c   Elevated serum creatinine       Relevant Orders   BASIC METABOLIC PANEL WITH GFR   Urine Microalbumin w/creat. ratio   Hemoglobin A1c      Elevated serum creatinine-current serum creatinine  is above his baseline which is around 1.15.  We discussed how CKD is diagnosed and different  parameters for measuring that.  Reviewed his results with him.  We will repeat it in a couple of weeks once he is off his current prednisone for his back.  If it still up around 1.2 then we will plan for renal ultrasound for further work-up.  We will also reevaluate for microalbuminuria at that time as well.  We will also screen for diabetes again.  Its been a couple years since  we have taken a look at that.  No follow-ups on file.   I spent 25 minutes on the day of the encounter to include pre-visit record review, face-to-face time with the patient and post visit ordering of test.    Beatrice Lecher, MD

## 2022-09-07 DIAGNOSIS — M48061 Spinal stenosis, lumbar region without neurogenic claudication: Secondary | ICD-10-CM | POA: Insufficient documentation

## 2022-09-07 NOTE — Assessment & Plan Note (Signed)
He did bring in a copy of his MRI of his lumbar spine without contrast that showed grade 1 spondylolisthesis L4-L5 with moderately severe spinal stenosis and moderate bilateral foraminal stenosis worse than on prior study.  Also showed moderate spinal stenosis and moderate bilateral foraminal stenosis at L3-4 worse than prior exam.  Bilateral subarticular stenosis and mild bilateral foraminal stenosis L5-S1.  They are planning to refer him to a neurosurgeon for further evaluation.  He is finishing up a course of steroids and is feeling some better.

## 2022-09-15 ENCOUNTER — Other Ambulatory Visit: Payer: Self-pay | Admitting: Family Medicine

## 2022-09-15 DIAGNOSIS — I10 Essential (primary) hypertension: Secondary | ICD-10-CM

## 2022-09-24 DIAGNOSIS — R809 Proteinuria, unspecified: Secondary | ICD-10-CM | POA: Diagnosis not present

## 2022-09-24 DIAGNOSIS — R7989 Other specified abnormal findings of blood chemistry: Secondary | ICD-10-CM | POA: Diagnosis not present

## 2022-09-25 ENCOUNTER — Ambulatory Visit: Payer: Self-pay

## 2022-09-25 LAB — HEMOGLOBIN A1C
Hgb A1c MFr Bld: 5.4 % of total Hgb (ref <5.7)
Mean Plasma Glucose: 108 mg/dL
eAG (mmol/L): 6 mmol/L

## 2022-09-25 LAB — BASIC METABOLIC PANEL WITH GFR
BUN: 15 mg/dL (ref 7–25)
CO2: 25 mmol/L (ref 20–32)
Calcium: 10 mg/dL (ref 8.6–10.3)
Chloride: 102 mmol/L (ref 98–110)
Creat: 1.09 mg/dL (ref 0.70–1.28)
Glucose, Bld: 91 mg/dL (ref 65–99)
Potassium: 3.8 mmol/L (ref 3.5–5.3)
Sodium: 139 mmol/L (ref 135–146)
eGFR: 72 mL/min/{1.73_m2} (ref >=60)

## 2022-09-25 LAB — MICROALBUMIN / CREATININE URINE RATIO
Creatinine, Urine: 88 mg/dL (ref 20–320)
Microalb Creat Ratio: 61 mcg/mg creat — ABNORMAL HIGH (ref <30)
Microalb, Ur: 5.4 mg/dL

## 2022-09-25 NOTE — Patient Outreach (Signed)
  Care Coordination   Follow Up Visit Note   09/25/2022 Name: Brandon [name] MRN: [id number] DOB: [birth year]  Brandon [name] is a 71 y.o. year old male who sees Metheney, Rene Kocher, MD for primary care. I spoke with  Brandon [name] by phone today.  What matters to the patients health and wellness today?  Brandon [name] reports MRI and xrays of his back done and reports arthritis of back/coccx. He states he had an office visit for back on yesterday at the [place] and is being referred to a specialist. He reports he has been using heat/ice and his pain has improved stating it is "6" on the pain scale 0-10 at this time. He states he was prescribed a pain patch, but has not started using the patch. He denies any other care coordination needs at this time and is agreeable to case closure. Patient to contact RNCM if care coordination, resource needs change.   Goals Addressed             This Visit's Progress    Health Management       Care Coordination Interventions: Discussed improvement in his back pain Encouraged to continue take medications as prescribed Reviewed upcoming appointments and encouraged to follow up with provider as scheduled Discussed where he can go to obtain a heating pad: can see if he has this as a plan benefit through insurance company or the [place], or obtain from [employer], [employer] or retail store Encouraged to contact care coordinator(contact number provided) and/or primary care provider if care coordination needs in the future.      SDOH assessments and interventions completed:  No  Care Coordination Interventions:  Yes, provided   Follow up plan: No further intervention required.   Encounter Outcome:  Pt. Visit Completed   Thea Silversmith, RN, MSN, BSN, [place] Coordinator [phone]

## 2022-09-25 NOTE — Progress Notes (Signed)
Labs look okay except a little bit of excess protein in the urine.  You losartan to help protect renal function.  We will keep an eye on this.

## 2022-09-25 NOTE — Patient Instructions (Signed)
Visit Information  Thank you for taking time to visit with me today. Please don't hesitate to contact me if I can be of assistance to you.   Following are the goals we discussed today:   Goals Addressed             This Visit's Progress    Health Management       Care Coordination Interventions: Discussed improvement in his back pain Encouraged to continue take medications as prescribed Reviewed upcoming appointments and encouraged to follow up with provider as scheduled Discussed where he can go to obtain a heating pad: can see if he has this as a plan benefit through insurance company or the [place], or obtain from [employer], [employer] or retail store Encouraged to contact care coordinator(contact number provided) and/or primary care provider if care coordination needs in the future.      If you are experiencing a Mental Health or [place] or need someone to talk to, please call the Suicide and Crisis Lifeline: 988  Patient verbalizes understanding of instructions and care plan provided today and agrees to view in [place]. Active MyChart status and patient understanding of how to access instructions and care plan via MyChart confirmed with patient.     Thea Silversmith, RN, MSN, BSN, [place] Coordinator [phone]

## 2022-09-26 DIAGNOSIS — M545 Low back pain, unspecified: Secondary | ICD-10-CM | POA: Diagnosis not present

## 2022-09-26 DIAGNOSIS — M533 Sacrococcygeal disorders, not elsewhere classified: Secondary | ICD-10-CM | POA: Diagnosis not present

## 2022-09-26 DIAGNOSIS — R1032 Left lower quadrant pain: Secondary | ICD-10-CM | POA: Diagnosis not present

## 2022-10-04 DIAGNOSIS — M545 Low back pain, unspecified: Secondary | ICD-10-CM | POA: Diagnosis not present

## 2022-10-04 DIAGNOSIS — M533 Sacrococcygeal disorders, not elsewhere classified: Secondary | ICD-10-CM | POA: Diagnosis not present

## 2022-10-04 DIAGNOSIS — R2689 Other abnormalities of gait and mobility: Secondary | ICD-10-CM | POA: Diagnosis not present

## 2022-10-09 DIAGNOSIS — M533 Sacrococcygeal disorders, not elsewhere classified: Secondary | ICD-10-CM | POA: Diagnosis not present

## 2022-10-09 DIAGNOSIS — M545 Low back pain, unspecified: Secondary | ICD-10-CM | POA: Diagnosis not present

## 2022-10-09 DIAGNOSIS — R2689 Other abnormalities of gait and mobility: Secondary | ICD-10-CM | POA: Diagnosis not present

## 2022-10-11 DIAGNOSIS — M533 Sacrococcygeal disorders, not elsewhere classified: Secondary | ICD-10-CM | POA: Diagnosis not present

## 2022-10-11 DIAGNOSIS — M545 Low back pain, unspecified: Secondary | ICD-10-CM | POA: Diagnosis not present

## 2022-10-11 DIAGNOSIS — R2689 Other abnormalities of gait and mobility: Secondary | ICD-10-CM | POA: Diagnosis not present

## 2022-10-16 NOTE — Progress Notes (Unsigned)
NEUROLOGY FOLLOW UP OFFICE NOTE  Saeed Toren [id number]  Assessment/Plan:   Migraine without aura, without status migrainosus, not intractable   Migraine prevention:  Propranolol ER 72m daily;  Topiramate 528mtwice daily *** Migraine rescue:  Tylenol Limit use of pain relievers to no more than 2 days out of week to prevent risk of rebound or medication-overuse headache. Keep headache diary Follow up 9 months     Subjective:  Buddie L. PaTumans a 89ear old right-handed male with HTN, HLD and hepatitis C who follows up for migraines.  He is accompanied by his wife who supplements history.   UPDATE: Intensity:  moderate-severe Duration:  2 hours with Tylenol. Frequency:  once a month  Current NSAIDS/analgesics:  Tylenol 50062murrent triptans:  none Current ergotamine:  none Current anti-emetic:  none Current muscle relaxants:  Flexeril 80m58mrrent Antihypertensive medications:  Propranolol ER 60mg26mly.  HCTZ, losartan Current Antidepressant medications: sertraline 100mg 14ment Anticonvulsant medications:  topiramate 50mg t44m daily Current anti-CGRP:  none Current Vitamins/Herbal/Supplements:  MVI Current Antihistamines/Decongestants:  Flonase, Zyrtec, Singulair Other therapy:  none Hormone/birth control:  none   Caffeine:  No coffee/soda Diet:  Hydrates with water.  Does not skip meals  Exercise:  walks Depression:  yes; Anxiety:  yes Other pain:  Back pain Sleep hygiene:  varies   HISTORY:  Onset:  Started after fracturing his nose in football in young adulthood.  Started having sinus issues and headaches.  Hasd deviated septum.  History of recurrent sinusitis. Location:  Bifrontal and top of head, face Quality:  pounding Initial Intensity:   Severe.   He denies new headache, thunderclap headache or severe headache that wakes him from sleep. Aura:  no Associated symptoms:  Nausea, photophobia, phonophobia, mild dizziness, mild blurred vision..   He Marland Kitchenenies associated unilateral numbness or weakness. Initial Duration:  Several hours.  He has had headaches that lasted several days. Initial Frequency:  6-7 days a month Initial Frequency of abortive medication: at least 3 days a week Triggers:  Seasonal allergies and sinus infections Relieving factors:  rest Activity:  aggravates     Past NSAIDS/analgesics:  Tramadol, ibuprofen 800mg Pa50mbortive triptans:  none Past abortive ergotamine:  none Past muscle relaxants:  baclofen Past anti-emetic:  none Past antihypertensive medications:  Verapamil, amlodipine Past antidepressant medications:  Cybalta Past anticonvulsant medications:  none Past anti-CGRP:  none Past vitamins/Herbal/Supplements:  none Past antihistamines/decongestants:  none Other past therapies:  none     Family history of headache:  no  PAST MEDICAL HISTORY: Past Medical History:  Diagnosis Date   ED (erectile dysfunction)    GERD (gastroesophageal reflux disease)    Hepatitis C    [phone]    Hypertension    Nose fracture     MEDICATIONS: Current Outpatient Medications on File Prior to Visit  Medication Sig Dispense Refill   albuterol (PROAIR HFA) 108 (90 Base) MCG/ACT inhaler Inhale 2 puffs into the lungs every 6 (six) hours as needed for wheezing or shortness of breath. 18 g 4   Alum Hydroxide-Mag Carbonate (ACID GONE ANTACID PO) Take 1 tablet by mouth daily as needed.     azelastine (ASTELIN) 0.1 % nasal spray Place 1 spray into both nostrils every morning. Use in each nostril as directed 30 mL 12   baclofen (LIORESAL) 10 MG tablet Take 10 mg by mouth 3 (three) times daily.     Black Elderberry 50 MG/5ML SYRP Take  1 Dose by mouth at bedtime.     Blood Glucose Monitoring Suppl (ONE TOUCH ULTRA 2) w/Device KIT DX DM E11.9 Check fasting blood sugar 2-3 times weekly. 1 kit prn   carboxymethylcellulose (REFRESH PLUS) 0.5 % SOLN 1 drop 3 (three) times daily as  needed.     cetirizine (ZYRTEC) 10 MG tablet TAKE 1 TABLET BY MOUTH EVERY DAY 90 tablet 3   Cholecalciferol (VITAMIN D3 PO) Take 1 tablet by mouth daily.     clindamycin (CLEOCIN T) 1 % lotion Apply topically 2 (two) times daily.     clindamycin (CLEOCIN T) 1 % SWAB      clotrimazole-betamethasone (LOTRISONE) cream Apply topically 2 (two) times daily. 45 g 3   diclofenac sodium (VOLTAREN) 1 % GEL Apply 2 g topically 4 (four) times daily. To affected areas 400 g PRN   fluticasone (FLONASE) 50 MCG/ACT nasal spray Place 2 sprays into both nostrils daily. 16 g 2   glucose blood (ONETOUCH ULTRA) test strip CHECK FASTING BLOOD SUGAR 2-3 TIMES WEEKLY. 50 strip 25   hydrochlorothiazide (HYDRODIURIL) 25 MG tablet Take 1 tablet  by mouth daily. 90 tablet 1   hydrocortisone (ANUSOL-HC) 25 MG suppository Place 25 mg rectally as needed. Reported on 04/19/2016     hydrocortisone 2.5 % cream Apply topically as needed. Reported on 04/19/2016     ipratropium (ATROVENT) 0.06 % nasal spray Place 2 sprays into both nostrils every 4 (four) hours as needed for rhinitis. 10 mL 6   Lancets 30G MISC DX DM E11.9 Check fasting blood sugar 2-3 times weekly. 100 each prn   lidocaine (LIDODERM) 5 % Place 1 patch onto the skin daily. Remove & Discard patch within 12 hours or as directed by MD     losartan (COZAAR) 100 MG tablet TAKE 1 TABLET BY MOUTH EVERY DAY 90 tablet 1   montelukast (SINGULAIR) 10 MG tablet TAKE 1 TABLET BY MOUTH AT BEDTIME. 30 tablet 5   Naftifine HCl 2 % CREA Apply 1 application topically 2 times a day 60 g 11   naproxen (NAPROSYN) 500 MG tablet Take 500 mg by mouth 2 (two) times daily as needed.     pantoprazole (PROTONIX) 40 MG tablet Take 40 mg by mouth 2 (two) times daily.     polycarbophil (FIBERCON) 625 MG tablet Take 625 mg by mouth daily.     polyethylene glycol (MIRALAX / GLYCOLAX) 17 g packet Take 17 g by mouth daily.     propranolol ER (INDERAL LA) 60 MG 24 hr capsule Take 1 capsule (60 mg  total) by mouth daily. 90 capsule 1   Propylene Glycol (SYSTANE BALANCE) 0.6 % SOLN Apply 1 drop to eye daily.     rosuvastatin (CRESTOR) 20 MG tablet Take 1 tablet (20 mg total) by mouth daily. [id number] 90 tablet 3   sertraline (ZOLOFT) 100 MG tablet Take 2 tablets by mouth daily. = 249m total daily dose[facility]    tadalafil (CIALIS) 5 MG tablet Take 5 mg by mouth daily.  11   tamsulosin (FLOMAX) 0.4 MG CAPS capsule Take 0.4 mg by mouth.     tiotropium (SPIRIVA) 18 MCG inhalation capsule Place 18 mcg into inhaler and inhale daily.     topiramate (TOPAMAX) 50 MG tablet Take 1 tablet (50 mg total) by mouth 2 (two) times daily. Keep anointment on 06/27/2022 180 tablet 0   TRAZODONE HCL PO Take 100 mg by mouth in the morning and at  bedtime. [facility]     valACYclovir (VALTREX) 500 MG tablet TAKE 1 TABLET BY MOUTH EVERY DAY 90 tablet 1   No current facility-administered medications on file prior to visit.    ALLERGIES: Allergies  Allergen Reactions   Amlodipine Rash   Atorvastatin Other (See Comments)    Myalgias    FAMILY HISTORY: Family History  Problem Relation Age of Onset   Breast cancer Sister    Depression Sister    Hyperlipidemia Other    Ovarian cancer Sister    Hypertension Other    Heart disease Brother       Objective:  *** General: No acute distress.  Patient appears well-groomed.   Head:  Normocephalic/atraumatic Eyes:  Fundi examined but not visualized Neck: supple, no paraspinal tenderness, full range of motion Heart:  Regular rate and rhythm Neurological Exam: alert and oriented to person, place, and time.  Speech fluent and not dysarthric, language intact.  CN II-XII intact. Bulk and tone normal, muscle strength 5/5 throughout.  Sensation to light touch intact.  Deep tendon reflexes 2+ throughout.  Finger to nose testing intact.  Gait normal, Romberg negative.   Metta Clines, DO  CC: Beatrice Lecher,  MD

## 2022-10-17 ENCOUNTER — Ambulatory Visit (INDEPENDENT_AMBULATORY_CARE_PROVIDER_SITE_OTHER): Payer: Medicare Other | Admitting: Neurology

## 2022-10-17 ENCOUNTER — Encounter: Payer: Self-pay | Admitting: Neurology

## 2022-10-17 DIAGNOSIS — G43701 Chronic migraine without aura, not intractable, with status migrainosus: Secondary | ICD-10-CM

## 2022-10-17 DIAGNOSIS — M545 Low back pain, unspecified: Secondary | ICD-10-CM | POA: Diagnosis not present

## 2022-10-17 DIAGNOSIS — M533 Sacrococcygeal disorders, not elsewhere classified: Secondary | ICD-10-CM | POA: Diagnosis not present

## 2022-10-17 DIAGNOSIS — R2689 Other abnormalities of gait and mobility: Secondary | ICD-10-CM | POA: Diagnosis not present

## 2022-10-17 MED ORDER — PROPRANOLOL HCL ER 60 MG PO CP24: 60.0000 mg | ORAL_CAPSULE | Freq: Every day | ORAL | 3 refills | Status: DC

## 2022-10-17 MED ORDER — TOPIRAMATE 50 MG PO TABS: 50.0000 mg | ORAL_TABLET | Freq: Two times a day (BID) | ORAL | 3 refills | Status: DC

## 2022-10-17 NOTE — Patient Instructions (Signed)
Refilled propranolol ER '60mg'$  daily and topiramate '50mg'$  twice daily Tylenol as needed.  Limit use of pain relievers to no more than 2 days out of week to prevent risk of rebound or medication-overuse headache. Follow up one year or as needed.

## 2022-10-19 DIAGNOSIS — R2689 Other abnormalities of gait and mobility: Secondary | ICD-10-CM | POA: Diagnosis not present

## 2022-10-19 DIAGNOSIS — M545 Low back pain, unspecified: Secondary | ICD-10-CM | POA: Diagnosis not present

## 2022-10-19 DIAGNOSIS — M533 Sacrococcygeal disorders, not elsewhere classified: Secondary | ICD-10-CM | POA: Diagnosis not present

## 2022-10-24 ENCOUNTER — Other Ambulatory Visit: Payer: Self-pay | Admitting: Family Medicine

## 2022-10-24 DIAGNOSIS — G43701 Chronic migraine without aura, not intractable, with status migrainosus: Secondary | ICD-10-CM

## 2022-10-26 DIAGNOSIS — M533 Sacrococcygeal disorders, not elsewhere classified: Secondary | ICD-10-CM | POA: Diagnosis not present

## 2022-10-26 DIAGNOSIS — M545 Low back pain, unspecified: Secondary | ICD-10-CM | POA: Diagnosis not present

## 2022-10-26 DIAGNOSIS — R2689 Other abnormalities of gait and mobility: Secondary | ICD-10-CM | POA: Diagnosis not present

## 2022-11-01 DIAGNOSIS — M545 Low back pain, unspecified: Secondary | ICD-10-CM | POA: Diagnosis not present

## 2022-11-01 DIAGNOSIS — R2689 Other abnormalities of gait and mobility: Secondary | ICD-10-CM | POA: Diagnosis not present

## 2022-11-01 DIAGNOSIS — M533 Sacrococcygeal disorders, not elsewhere classified: Secondary | ICD-10-CM | POA: Diagnosis not present

## 2022-11-02 DIAGNOSIS — M461 Sacroiliitis, not elsewhere classified: Secondary | ICD-10-CM | POA: Diagnosis not present

## 2022-11-02 DIAGNOSIS — M545 Low back pain, unspecified: Secondary | ICD-10-CM | POA: Diagnosis not present

## 2022-11-02 DIAGNOSIS — M47816 Spondylosis without myelopathy or radiculopathy, lumbar region: Secondary | ICD-10-CM | POA: Diagnosis not present

## 2022-11-02 DIAGNOSIS — G8929 Other chronic pain: Secondary | ICD-10-CM | POA: Diagnosis not present

## 2022-11-09 DIAGNOSIS — M533 Sacrococcygeal disorders, not elsewhere classified: Secondary | ICD-10-CM | POA: Diagnosis not present

## 2022-11-09 DIAGNOSIS — R2689 Other abnormalities of gait and mobility: Secondary | ICD-10-CM | POA: Diagnosis not present

## 2022-11-09 DIAGNOSIS — M545 Low back pain, unspecified: Secondary | ICD-10-CM | POA: Diagnosis not present

## 2022-11-16 DIAGNOSIS — M533 Sacrococcygeal disorders, not elsewhere classified: Secondary | ICD-10-CM | POA: Diagnosis not present

## 2022-11-16 DIAGNOSIS — R2689 Other abnormalities of gait and mobility: Secondary | ICD-10-CM | POA: Diagnosis not present

## 2022-11-16 DIAGNOSIS — M545 Low back pain, unspecified: Secondary | ICD-10-CM | POA: Diagnosis not present

## 2022-11-21 DIAGNOSIS — R2689 Other abnormalities of gait and mobility: Secondary | ICD-10-CM | POA: Diagnosis not present

## 2022-11-21 DIAGNOSIS — M545 Low back pain, unspecified: Secondary | ICD-10-CM | POA: Diagnosis not present

## 2022-11-21 DIAGNOSIS — M533 Sacrococcygeal disorders, not elsewhere classified: Secondary | ICD-10-CM | POA: Diagnosis not present

## 2022-11-26 DIAGNOSIS — N401 Enlarged prostate with lower urinary tract symptoms: Secondary | ICD-10-CM | POA: Diagnosis not present

## 2022-11-26 DIAGNOSIS — R399 Unspecified symptoms and signs involving the genitourinary system: Secondary | ICD-10-CM | POA: Diagnosis not present

## 2022-11-26 DIAGNOSIS — N138 Other obstructive and reflux uropathy: Secondary | ICD-10-CM | POA: Diagnosis not present

## 2022-11-28 DIAGNOSIS — M533 Sacrococcygeal disorders, not elsewhere classified: Secondary | ICD-10-CM | POA: Diagnosis not present

## 2022-11-28 DIAGNOSIS — M545 Low back pain, unspecified: Secondary | ICD-10-CM | POA: Diagnosis not present

## 2022-11-28 DIAGNOSIS — R2689 Other abnormalities of gait and mobility: Secondary | ICD-10-CM | POA: Diagnosis not present

## 2022-12-26 ENCOUNTER — Ambulatory Visit: Payer: Medicare Other | Admitting: Pharmacist

## 2022-12-26 NOTE — Progress Notes (Signed)
12/26/2022 Name: Brandon [name] MRN: [id number] DOB: 1951-06-01  Chief Complaint  Patient presents with   Hypertension    Brandon [name] is a 72 y.o. year old male who was referred for medication management by their primary care provider, Brandon Marry, MD. They presented for a face to face visit today.   They were referred to the pharmacist by their PCP for assistance in managing hypertension    Subjective:  Care Team: Primary Care Provider: Hali Marry, MD ;  Sees [facility] providers: spine, cardiologist   Medication Access/Adherence  Current [facility]acy[facility]  [id number] [place], [place] [id number] [address]Phone: [phone]: [phone] [place] [facility] [address] [place] [address]Phone: [phone]: [phone] [place] NRe[facility] CenterBRoyalton[place] [address]Phone: [phone]: [phone] [place]Phone: [phone]: [phone]   Hypertension:  Current medications: hydrochlorothiazide '25mg'$  daily, losartan '100mg'$  daily, propranolol ER '60mg'$  daily (for migraine prophylaxis, not taking consistently)  Patient has a validated, automated, upper arm home BP cuff Current blood pressure readings readings: 120/70, SBP usually 120-130s  Patient denies hypotensive s/sx including dizziness, lightheadedness. Patient does report some occasional dizziness, possibly around the timing of switching from tamsulosin to alfuzosin. When he checks blood pressure around time of dizziness, patient states it is normal/usual readings. Patient denies hypertensive  symptoms including headache, chest pain, shortness of breath   Objective:  Lab Results  Component Value Date   HGBA1C 5.4 09/24/2022    Lab Results  Component Value Date   CREATININE 1.09 09/24/2022   BUN 15 09/24/2022   NA 139 09/24/2022   K 3.8 09/24/2022   CL 102 09/24/2022   CO2 25 09/24/2022    Lab Results  Component Value Date   CHOL 124 12/06/2021   HDL 36 (L) 12/06/2021   LDLCALC 65 12/06/2021   TRIG 155 (H) 12/06/2021   CHOLHDL 3.4 12/06/2021    Medications Reviewed Today     Reviewed by KDarius [name] RPH (Pharmacist) on 12/26/22 at 1021  Med List Status: <None>   Medication Order Taking? Sig Documenting Provider Last Dose Status Informant  albuterol (PROAIR HFA) 108 (90 Base) MCG/ACT inhaler [id number] Inhale 2 puffs into the lungs every 6 (six) hours as needed for wheezing or shortness of breath. MHali Marry MD Taking Active   alfuzosin (UROXATRAL) 10 MG 24 hr tablet [id number] Take 10 mg by mouth daily with breakfast. [provider] Taking Active   Alum Hydroxide-Mag Carbonate (ACID GONE ANTACID PO) [id number] Take 1 tablet by mouth daily as needed. [provider] Taking Active   azelastine (ASTELIN) 0.1 % nasal spray [id number] Place 1 spray into both nostrils every morning. Use in each nostril as directed SChesley Mires MD Taking Active   baclofen (LIORESAL) 10 MG tablet [id number] Take 10 mg by mouth 3 (three) times daily. [provider] Taking Active Self  Black Elderberry 50 MG/5ML SYRP [id number] Take 1 Dose by mouth at bedtime. [provider] Taking Active   Blood Glucose Monitoring Suppl (ONE TOUCH ULTRA 2) w/Device KIT [id number]  Yes DX DM E11.9 Check fasting blood sugar 2-3 times weekly. Brandon Marry, MD Taking Active   carboxymethylcellulose (REFRESH PLUS) 0.5 % SOLN [id number] Yes 1 drop 3 (three) times daily as needed. [provider] Taking Active   cetirizine  (ZYRTEC) 10 MG tablet [id number] Yes TAKE 1 TABLET BY MOUTH EVERY DAY Brandon Reeve, DO Taking Active   Cholecalciferol (VITAMIN D3 PO) [id number] Yes Take 1 tablet by mouth daily. [provider] Taking Active   clindamycin (CLEOCIN T) 1 % lotion [id number] Yes Apply topically 2 (two) times daily. [provider] Taking Active   clindamycin (CLEOCIN T) 1 % SWAB [id number] Yes  [provider] Taking Active            Med Note ([name], Brandon C   Mon Mar 10, 2018  3:11 PM)    clotrimazole-betamethasone (LOTRISONE) cream [id number] Yes Apply topically 2 (two) times daily. Brandon Marry, MD Taking Active   diclofenac sodium (VOLTAREN) 1 % GEL [id number] Yes Apply 2 g topically 4 (four) times daily. To affected areas Brandon Marry, MD Taking Active   fluticasone [facility]) 50 MCG/ACT nasal spray [id number] Yes Place 2 sprays into both nostrils daily. Brandon Hams, MD Taking Active   gabapentin (NEURONTIN) 100 MG capsule [id number] Yes Take 100 mg by mouth 3 (three) times daily. [provider] Taking Active   glucose blood (ONETOUCH ULTRA) test strip [id number] Yes CHECK FASTING BLOOD SUGAR 2-3 TIMES WEEKLY. Brandon Marry, MD Taking Active   hydrochlorothiazide (HYDRODIURIL) 25 MG tablet [id number] Yes Take 1 tablet  by mouth daily. Brandon Marry, MD Taking Active   hydrocortisone (ANUSOL-HC) 25 MG suppository [id number] Yes Place 25 mg rectally as needed. Reported on 04/19/2016 [provider] Taking Active   hydrocortisone 2.5 % cream [id number] Yes Apply topically as needed. Reported on 04/19/2016 [provider] Taking Active   ipratropium (ATROVENT) 0.06 % nasal spray [id number] Yes Place 2 sprays into both nostrils every 4 (four) hours as needed for rhinitis. Brandon Hams, MD Taking Active   Lancets 30G MISC [id number] Yes DX DM E11.9 Check fasting blood sugar 2-3 times weekly. Brandon Marry, MD Taking Active    lidocaine (LIDODERM) 5 % [id number] Yes Place 1 patch onto the skin daily. Remove & Discard patch within 12 hours or as directed by MD [provider] Taking Active Self  losartan (COZAAR) 100 MG tablet [id number] Yes TAKE 1 TABLET BY MOUTH EVERY DAY Brandon Marry, MD Taking Active   montelukast (SINGULAIR) 10 MG tablet [id number] Yes TAKE 1 TABLET BY MOUTH AT BEDTIME. Brandon Marry, MD Taking Active   Naftifine HCl 2 % CREA [id number] Yes Apply 1 application topically 2 times a day Brandon Marry, MD Taking Active   naproxen (NAPROSYN) 500 MG tablet [id number] Yes Take 500 mg by mouth 2 (two) times daily as needed. [provider] Taking Active Self  pantoprazole (PROTONIX) 40 MG tablet [id number] Yes Take 40 mg by mouth 2 (two) times daily. [provider] Taking Active   polycarbophil (FIBERCON) 625 MG tablet [id number] Yes Take 625 mg by mouth daily. [provider] Taking Active   polyethylene glycol (MIRALAX / GLYCOLAX) 17 g packet [id number] Yes Take 17 g by mouth daily. [provider] Taking Active   propranolol ER (INDERAL LA) 60 MG 24 hr capsule [id number] Yes Take 1 capsule (60 mg total) by mouth daily. Pieter Partridge, DO Taking Active   Propylene Glycol (  SYSTANE BALANCE) 0.6 % SOLN [id number] Yes Apply 1 drop to eye daily. [provider] Taking Active   rosuvastatin (CRESTOR) 20 MG tablet [id number] Yes Take 1 tablet (20 mg total) by mouth daily. Lebo, MD Taking Active   sertraline (ZOLOFT) 100 MG tablet [id number] Yes Take 2 tablets by mouth daily. = '200mg'$  total daily dose, managed by psychiatry at [facility] [provider] Taking Active   tadalafil (CIALIS) 5 MG tablet [id number] Yes Take 5 mg by mouth daily. [provider] Taking Active   tiotropium (SPIRIVA) 18 MCG inhalation capsule [id number] Yes Place 18 mcg into inhaler and inhale daily. [provider] Taking Active    topiramate (TOPAMAX) 50 MG tablet [id number] Yes Take 1 tablet (50 mg total) by mouth 2 (two) times daily. Keep anointment on 06/27/2022 Pieter Partridge, DO Taking Active   traMADol Veatrice Bourbon) 50 MG tablet [id number] Yes Take 50 mg by mouth every 12 (twelve) hours as needed for moderate pain. [provider] Taking Active   TRAZODONE HCL PO [id number] Yes Take 100 mg by mouth in the morning and at bedtime. Managed by psychiatry at [facility] [provider] Taking Active   valACYclovir (VALTREX) 500 MG tablet [id number] Yes TAKE 1 TABLET BY MOUTH EVERY DAY Brandon Marry, MD Taking Active               Assessment/Plan:   Hypertension: - Currently controlled - Reviewed long term cardiovascular and renal outcomes of uncontrolled blood pressure - Reviewed appropriate blood pressure monitoring technique and reviewed goal blood pressure. Recommended to check home blood pressure and heart rate 2-3x weekly - Recommend to continue current medications - For occasional dizziness, recommended patient try taking alfuzosin at bedtime instead of morning to see if this lessens dizzy symptoms.      Follow Up Plan: as needed  Larinda Buttery, PharmD Clinical Pharmacist [facility] [phone]

## 2022-12-28 ENCOUNTER — Other Ambulatory Visit: Payer: Self-pay | Admitting: Family Medicine

## 2023-01-10 ENCOUNTER — Telehealth: Payer: Self-pay | Admitting: Family Medicine

## 2023-01-10 NOTE — Telephone Encounter (Signed)
Pt called requesting a Colon Cancer Screening.

## 2023-01-10 NOTE — Telephone Encounter (Signed)
Called pt to see if he was going to get this done with the [facility] or if we needed to send the referral to [facility] where he had it done prior to getting it done before he got it done before he went thru the [place].  Pt stated that he would call back and let us know.

## 2023-01-19 ENCOUNTER — Other Ambulatory Visit: Payer: Self-pay | Admitting: Family Medicine

## 2023-01-22 DIAGNOSIS — M4716 Other spondylosis with myelopathy, lumbar region: Secondary | ICD-10-CM | POA: Diagnosis not present

## 2023-01-22 DIAGNOSIS — M545 Low back pain, unspecified: Secondary | ICD-10-CM | POA: Diagnosis not present

## 2023-01-22 DIAGNOSIS — M533 Sacrococcygeal disorders, not elsewhere classified: Secondary | ICD-10-CM | POA: Diagnosis not present

## 2023-01-23 ENCOUNTER — Other Ambulatory Visit: Payer: Self-pay | Admitting: Family Medicine

## 2023-01-23 NOTE — Telephone Encounter (Signed)
Patient scheduled for 02/18/2023 @2 :20. tvt

## 2023-01-23 NOTE — Telephone Encounter (Signed)
Please call pt he will need a f/u for medication refill on Losartan. Thank you.

## 2023-01-26 ENCOUNTER — Other Ambulatory Visit: Payer: Self-pay | Admitting: Family Medicine

## 2023-01-26 DIAGNOSIS — A6000 Herpesviral infection of urogenital system, unspecified: Secondary | ICD-10-CM

## 2023-01-29 DIAGNOSIS — M545 Low back pain, unspecified: Secondary | ICD-10-CM | POA: Diagnosis not present

## 2023-01-29 DIAGNOSIS — M4716 Other spondylosis with myelopathy, lumbar region: Secondary | ICD-10-CM | POA: Diagnosis not present

## 2023-01-29 DIAGNOSIS — M533 Sacrococcygeal disorders, not elsewhere classified: Secondary | ICD-10-CM | POA: Diagnosis not present

## 2023-02-05 DIAGNOSIS — M4716 Other spondylosis with myelopathy, lumbar region: Secondary | ICD-10-CM | POA: Diagnosis not present

## 2023-02-05 DIAGNOSIS — M545 Low back pain, unspecified: Secondary | ICD-10-CM | POA: Diagnosis not present

## 2023-02-05 DIAGNOSIS — M533 Sacrococcygeal disorders, not elsewhere classified: Secondary | ICD-10-CM | POA: Diagnosis not present

## 2023-02-13 DIAGNOSIS — M545 Low back pain, unspecified: Secondary | ICD-10-CM | POA: Diagnosis not present

## 2023-02-13 DIAGNOSIS — R1032 Left lower quadrant pain: Secondary | ICD-10-CM | POA: Diagnosis not present

## 2023-02-13 DIAGNOSIS — M5386 Other specified dorsopathies, lumbar region: Secondary | ICD-10-CM | POA: Diagnosis not present

## 2023-02-13 DIAGNOSIS — M533 Sacrococcygeal disorders, not elsewhere classified: Secondary | ICD-10-CM | POA: Diagnosis not present

## 2023-02-13 DIAGNOSIS — M6281 Muscle weakness (generalized): Secondary | ICD-10-CM | POA: Diagnosis not present

## 2023-02-13 DIAGNOSIS — R6889 Other general symptoms and signs: Secondary | ICD-10-CM | POA: Diagnosis not present

## 2023-02-18 ENCOUNTER — Encounter: Payer: Self-pay | Admitting: Family Medicine

## 2023-02-18 ENCOUNTER — Ambulatory Visit (INDEPENDENT_AMBULATORY_CARE_PROVIDER_SITE_OTHER): Payer: Medicare Other | Admitting: Family Medicine

## 2023-02-18 VITALS — BP 117/71 | HR 95 | Resp 20 | Ht 67.0 in | Wt 193.0 lb

## 2023-02-18 DIAGNOSIS — K5900 Constipation, unspecified: Secondary | ICD-10-CM | POA: Diagnosis not present

## 2023-02-18 DIAGNOSIS — R1033 Periumbilical pain: Secondary | ICD-10-CM

## 2023-02-18 DIAGNOSIS — I10 Essential (primary) hypertension: Secondary | ICD-10-CM

## 2023-02-18 DIAGNOSIS — J302 Other seasonal allergic rhinitis: Secondary | ICD-10-CM | POA: Insufficient documentation

## 2023-02-18 LAB — CBC WITH DIFFERENTIAL/PLATELET
Eosinophils Absolute: 148 cells/uL (ref 15–500)
Lymphs Abs: 2140 cells/uL (ref 850–3900)
MCHC: 33.1 g/dL (ref 32.0–36.0)
MPV: 12.5 fL (ref 7.5–12.5)
Neutrophils Relative %: 63.6 %
Platelets: 177 10*3/uL (ref 140–400)
WBC: 8.2 10*3/uL (ref 3.8–10.8)

## 2023-02-18 MED ORDER — LUBIPROSTONE 8 MCG PO CAPS
8.0000 ug | ORAL_CAPSULE | Freq: Two times a day (BID) | ORAL | 0 refills | Status: DC
Start: 2023-02-18 — End: 2023-05-13

## 2023-02-18 NOTE — Progress Notes (Signed)
Established Patient Office Visit  Subjective   Patient ID: Brandon [name], male    DOB: 1951/03/17  Age: 72 y.o. MRN: [id number]  Chief Complaint  Patient presents with  . Follow-up  . Hypertension    HPI  Hypertension- Pt denies chest pain, SOB, dizziness, or heart palpitations.  Taking meds as directed w/o problems.  Denies medication side effects.    He also wants to discuss his bowels.  He gets a lot of mid abdominal pain usually associated with constipation.  He usually has to strain.  He takes Metamucil daily.  He says he was also given a powder at the [place] to take which was a laxative so I suspect it was probably MiraLAX.  But he says he does not like taking that every day.  Occasionally he will actually have diarrhea but most the time it is just the constipation and abdominal discomfort.  He does usually get relief after a bowel movement.  Experiences a lot of bloating as well.  Orts occasional bright red blood but usually feels like it is from his hemorrhoids when they are flaring.  {History (Optional):23778}  ROS    Objective:     BP 117/71 (BP Location: Left Arm, Cuff Size: Large)   Pulse 95   Resp 20   Ht 5\' 7"  (1.702 m)   Wt 193 lb 0.6 oz (87.6 kg)   SpO2 97%   BMI 30.23 kg/m  {Vitals History (Optional):23777}  Physical Exam Constitutional:      Appearance: He is well-developed.  HENT:     Head: Normocephalic and atraumatic.  Cardiovascular:     Rate and Rhythm: Normal rate and regular rhythm.     Heart sounds: Normal heart sounds.  Pulmonary:     Effort: Pulmonary effort is normal.     Breath sounds: Normal breath sounds.  Skin:    General: Skin is warm and dry.  Neurological:     Mental Status: He is alert and oriented to person, place, and time.  Psychiatric:        Behavior: Behavior normal.     No results found for any visits on 02/18/23.  {Labs (Optional):23779}  The ASCVD Risk score (Arnett DK, et al., 2019) failed to calculate for  the following reasons:   The valid total cholesterol range is 130 to 320 mg/dL    Assessment & Plan:   Problem List Items Addressed This Visit       Cardiovascular and Mediastinum   Essential hypertension, benign - Primary    Well controlled. Continue current regimen. Follow up in  98mo       Relevant Orders   COMPLETE METABOLIC PANEL WITH GFR   Lipase   CBC with Differential/Platelet   TSH     Other   Constipation   Relevant Medications   lubiprostone (AMITIZA) 8 MCG capsule   Other Relevant Orders   COMPLETE METABOLIC PANEL WITH GFR   Lipase   CBC with Differential/Platelet   TSH   Other Visit Diagnoses     Periumbilical abdominal pain       Relevant Orders   COMPLETE METABOLIC PANEL WITH GFR   Lipase   CBC with Differential/Platelet   TSH      The patient with abdominal pain.  He may have some component of IBS.  We did discuss the option of Amitiza or Linzess since he already takes Metamucil daily and does not really like doing the MiraLAX.  Will start with 1  of these medications and do a 32-month trial and see if it is helpful.  We can always adjust the dose if needed.  Return in about 6 months (around 08/20/2023).    Nani Gasser, MD

## 2023-02-18 NOTE — Assessment & Plan Note (Signed)
Well controlled. Continue current regimen. Follow up in  6 mo  

## 2023-02-19 DIAGNOSIS — M533 Sacrococcygeal disorders, not elsewhere classified: Secondary | ICD-10-CM | POA: Diagnosis not present

## 2023-02-19 DIAGNOSIS — M4716 Other spondylosis with myelopathy, lumbar region: Secondary | ICD-10-CM | POA: Diagnosis not present

## 2023-02-19 DIAGNOSIS — M545 Low back pain, unspecified: Secondary | ICD-10-CM | POA: Diagnosis not present

## 2023-02-19 LAB — COMPLETE METABOLIC PANEL WITH GFR
AG Ratio: 1.7 (calc) (ref 1.0–2.5)
ALT: 21 U/L (ref 9–46)
AST: 20 U/L (ref 10–35)
Albumin: 4.4 g/dL (ref 3.6–5.1)
Alkaline phosphatase (APISO): 75 U/L (ref 35–144)
BUN: 13 mg/dL (ref 7–25)
CO2: 25 mmol/L (ref 20–32)
Calcium: 9.6 mg/dL (ref 8.6–10.3)
Chloride: 104 mmol/L (ref 98–110)
Creat: 1.05 mg/dL (ref 0.70–1.28)
Globulin: 2.6 g/dL (calc) (ref 1.9–3.7)
Glucose, Bld: 88 mg/dL (ref 65–99)
Potassium: 3.8 mmol/L (ref 3.5–5.3)
Sodium: 140 mmol/L (ref 135–146)
Total Bilirubin: 0.5 mg/dL (ref 0.2–1.2)
Total Protein: 7 g/dL (ref 6.1–8.1)
eGFR: 75 mL/min/{1.73_m2} (ref >=60)

## 2023-02-19 LAB — CBC WITH DIFFERENTIAL/PLATELET
Absolute Monocytes: 648 cells/uL (ref 200–950)
Basophils Absolute: 49 cells/uL (ref 0–200)
Basophils Relative: 0.6 %
Eosinophils Relative: 1.8 %
HCT: 47.7 % (ref 38.5–50.0)
Hemoglobin: 15.8 g/dL (ref 13.2–17.1)
MCH: 26.6 pg — ABNORMAL LOW (ref 27.0–33.0)
MCV: 80.2 fL (ref 80.0–100.0)
Monocytes Relative: 7.9 %
Neutro Abs: 5215 cells/uL (ref 1500–7800)
RBC: 5.95 10*6/uL — ABNORMAL HIGH (ref 4.20–5.80)
RDW: 15.6 % — ABNORMAL HIGH (ref 11.0–15.0)
Total Lymphocyte: 26.1 %

## 2023-02-19 LAB — LIPASE: Lipase: 43 U/L (ref 7–60)

## 2023-02-19 LAB — TSH: TSH: 0.77 mIU/L (ref 0.40–4.50)

## 2023-02-19 NOTE — Assessment & Plan Note (Signed)
He has never tried Linzess or Amitiza and does do daily fiber.  It looks like Amitiza is covered by his insurance plan so we will start with 8 mg and we can adjust the dose if needed.  Sounds like he may have some component of IBS but predominantly is constipation.

## 2023-02-20 NOTE — Progress Notes (Signed)
Brandon [name], your labs overall look reassuring no worrisome findings.  I think some the abdominal pain probably really is related to the constipation.  I did send over the new medication for you to try you take it every day, not as needed.  But I like for you to give it a try and see if you feel like it is helpful and having more good days with your bowels.  Let me know when and where you had your last colonoscopy so I can make sure that our records are up-to-date.

## 2023-02-27 DIAGNOSIS — M545 Low back pain, unspecified: Secondary | ICD-10-CM | POA: Diagnosis not present

## 2023-02-27 DIAGNOSIS — M4716 Other spondylosis with myelopathy, lumbar region: Secondary | ICD-10-CM | POA: Diagnosis not present

## 2023-02-27 DIAGNOSIS — M533 Sacrococcygeal disorders, not elsewhere classified: Secondary | ICD-10-CM | POA: Diagnosis not present

## 2023-03-05 DIAGNOSIS — M4716 Other spondylosis with myelopathy, lumbar region: Secondary | ICD-10-CM | POA: Diagnosis not present

## 2023-03-05 DIAGNOSIS — M533 Sacrococcygeal disorders, not elsewhere classified: Secondary | ICD-10-CM | POA: Diagnosis not present

## 2023-03-05 DIAGNOSIS — R399 Unspecified symptoms and signs involving the genitourinary system: Secondary | ICD-10-CM | POA: Diagnosis not present

## 2023-03-05 DIAGNOSIS — M545 Low back pain, unspecified: Secondary | ICD-10-CM | POA: Diagnosis not present

## 2023-03-05 DIAGNOSIS — N5201 Erectile dysfunction due to arterial insufficiency: Secondary | ICD-10-CM | POA: Diagnosis not present

## 2023-03-05 DIAGNOSIS — N138 Other obstructive and reflux uropathy: Secondary | ICD-10-CM | POA: Diagnosis not present

## 2023-03-05 DIAGNOSIS — N401 Enlarged prostate with lower urinary tract symptoms: Secondary | ICD-10-CM | POA: Diagnosis not present

## 2023-03-12 DIAGNOSIS — M533 Sacrococcygeal disorders, not elsewhere classified: Secondary | ICD-10-CM | POA: Diagnosis not present

## 2023-03-12 DIAGNOSIS — M4716 Other spondylosis with myelopathy, lumbar region: Secondary | ICD-10-CM | POA: Diagnosis not present

## 2023-03-12 DIAGNOSIS — M545 Low back pain, unspecified: Secondary | ICD-10-CM | POA: Diagnosis not present

## 2023-03-14 ENCOUNTER — Other Ambulatory Visit: Payer: Self-pay | Admitting: Family Medicine

## 2023-03-14 DIAGNOSIS — A6000 Herpesviral infection of urogenital system, unspecified: Secondary | ICD-10-CM

## 2023-03-20 ENCOUNTER — Telehealth: Payer: Self-pay | Admitting: Family Medicine

## 2023-03-20 DIAGNOSIS — I10 Essential (primary) hypertension: Secondary | ICD-10-CM

## 2023-03-20 MED ORDER — HYDROCHLOROTHIAZIDE 25 MG PO TABS
25.0000 mg | ORAL_TABLET | Freq: Every day | ORAL | 1 refills | Status: DC
Start: 2023-03-20 — End: 2023-03-29

## 2023-03-20 NOTE — Telephone Encounter (Signed)
Patient called requesting a refill of ; Hydrochlorothiazide 25mg    Ph[facility]acy ;  [facility] [address] [place]   [phone]

## 2023-03-20 NOTE — Telephone Encounter (Signed)
Medication sent to local pharmacy.  ?

## 2023-03-22 DIAGNOSIS — M533 Sacrococcygeal disorders, not elsewhere classified: Secondary | ICD-10-CM | POA: Diagnosis not present

## 2023-03-22 DIAGNOSIS — M4716 Other spondylosis with myelopathy, lumbar region: Secondary | ICD-10-CM | POA: Diagnosis not present

## 2023-03-22 DIAGNOSIS — M545 Low back pain, unspecified: Secondary | ICD-10-CM | POA: Diagnosis not present

## 2023-03-29 ENCOUNTER — Other Ambulatory Visit: Payer: Self-pay | Admitting: *Deleted

## 2023-03-29 DIAGNOSIS — I10 Essential (primary) hypertension: Secondary | ICD-10-CM

## 2023-03-29 MED ORDER — HYDROCHLOROTHIAZIDE 25 MG PO TABS
25.0000 mg | ORAL_TABLET | Freq: Every day | ORAL | 1 refills | Status: DC
Start: 2023-03-29 — End: 2023-09-11

## 2023-05-13 ENCOUNTER — Other Ambulatory Visit: Payer: Self-pay | Admitting: Family Medicine

## 2023-05-13 DIAGNOSIS — K5900 Constipation, unspecified: Secondary | ICD-10-CM

## 2023-07-05 ENCOUNTER — Telehealth: Payer: Self-pay | Admitting: Family Medicine

## 2023-07-05 NOTE — Telephone Encounter (Signed)
Patient called in wanting to discuss something concerning his last appointment, would not give details. Please Advise

## 2023-07-05 NOTE — Telephone Encounter (Signed)
Pt wanted to let us know that some forms are going to be sent from the [place] regarding his disability and wanted to give Dr.Metheney a heads up

## 2023-07-17 ENCOUNTER — Other Ambulatory Visit: Payer: Self-pay | Admitting: Family Medicine

## 2023-08-12 ENCOUNTER — Other Ambulatory Visit: Payer: Self-pay | Admitting: Family Medicine

## 2023-08-12 DIAGNOSIS — K5900 Constipation, unspecified: Secondary | ICD-10-CM

## 2023-08-16 ENCOUNTER — Ambulatory Visit: Payer: Medicare Other | Admitting: Family Medicine

## 2023-08-20 ENCOUNTER — Ambulatory Visit: Payer: Medicare Other | Admitting: Family Medicine

## 2023-08-23 ENCOUNTER — Ambulatory Visit (INDEPENDENT_AMBULATORY_CARE_PROVIDER_SITE_OTHER): Payer: Medicare Other | Admitting: Family Medicine

## 2023-08-23 ENCOUNTER — Encounter: Payer: Self-pay | Admitting: Family Medicine

## 2023-08-23 VITALS — BP 115/56 | HR 66 | Ht 67.0 in | Wt 194.0 lb

## 2023-08-23 DIAGNOSIS — J45909 Unspecified asthma, uncomplicated: Secondary | ICD-10-CM

## 2023-08-23 DIAGNOSIS — K5904 Chronic idiopathic constipation: Secondary | ICD-10-CM

## 2023-08-23 DIAGNOSIS — F339 Major depressive disorder, recurrent, unspecified: Secondary | ICD-10-CM

## 2023-08-23 DIAGNOSIS — I10 Essential (primary) hypertension: Secondary | ICD-10-CM | POA: Diagnosis not present

## 2023-08-23 DIAGNOSIS — E785 Hyperlipidemia, unspecified: Secondary | ICD-10-CM | POA: Diagnosis not present

## 2023-08-23 DIAGNOSIS — D562 Delta-beta thalassemia: Secondary | ICD-10-CM

## 2023-08-23 NOTE — Assessment & Plan Note (Addendum)
Will do a peak flow today and check oxygen.   Peak flows in the green zone [phone].  With average of 450.  Pulse ox is normal as well.  No abnormal chest exam today.  We discussed maybe keeping an eye on things and if he feels like it continues or if it is not getting better or worsens to please let us know and we can always get a chest x-ray for further workup.

## 2023-08-23 NOTE — Assessment & Plan Note (Signed)
Followed at the [place].  Followed at the [facility], currently on sertraline 100 mg daily.

## 2023-08-23 NOTE — Assessment & Plan Note (Signed)
Systolic blood pressure just a little bit elevated today normally it looks great we will recheck again before he leaves today.

## 2023-08-23 NOTE — Progress Notes (Signed)
Established Patient Office Visit  Subjective   Patient ID: Brandon [name], male    DOB: May 20, 1951  Age: 72 y.o. MRN: [id number]  Chief Complaint  Patient presents with   Abdominal Pain    HPI  Hypertension- Pt denies chest pain, SOB, dizziness, or heart palpitations.  Taking meds as directed w/o problems.  Denies medication side effects.    F/U Asthma -he says he just feels like his breathing is off a little bit he has an occasional cough but nothing new he has not had a cold or anything like that.  He says it can hit anytime sometimes at rest sometimes with activity.  He occasionally has some chest discomfort but not necessarily related to the breathing sensation.  He does have a history of asthma he says he did not use his inhaler the other day when he felt it but did not really notice a big improvement.  He says that he has been starting to get some more right sided abdominal pain and epigastric pain that is quite intense and severe.  He was having this previously when we actually put him on the Amitiza and he says he did get good relief in his abdominal pain symptoms but then felt like he was having soft stools and loose stools too frequently so he stopped the medication.     ROS    Objective:     BP (!) 115/56   Pulse 66   Ht 5\' 7"  (1.702 m)   Wt 194 lb (88 kg)   SpO2 97%   PF 450 L/min Comment: green zone=[phone]  BMI 30.38 kg/m    Physical Exam Vitals and nursing note reviewed.  Constitutional:      Appearance: Normal appearance.  HENT:     Head: Normocephalic and atraumatic.     Nose: Nose normal.     Mouth/Throat:     Pharynx: Oropharynx is clear.  Eyes:     Conjunctiva/sclera: Conjunctivae normal.  Cardiovascular:     Rate and Rhythm: Normal rate and regular rhythm.  Pulmonary:     Effort: Pulmonary effort is normal.     Breath sounds: Normal breath sounds.  Musculoskeletal:     Cervical back: Neck supple. No tenderness.  Lymphadenopathy:      Cervical: No cervical adenopathy.  Skin:    General: Skin is warm and dry.  Neurological:     Mental Status: He is alert and oriented to person, place, and time.  Psychiatric:        Mood and Affect: Mood normal.      No results found for any visits on 08/23/23.    The ASCVD Risk score (Arnett DK, et al., 2019) failed to calculate for the following reasons:   The valid total cholesterol range is 130 to 320 mg/dL    Assessment & Plan:   Problem List Items Addressed This Visit       Cardiovascular and Mediastinum   Essential hypertension, benign - Primary    Systolic blood pressure just a little bit elevated today normally it looks great we will recheck again before he leaves today.      Relevant Orders   CMP14+EGFR   TSH   Lipid panel   CBC     Respiratory   Asthma    Will do a peak flow today and check oxygen.   Peak flows in the green zone 92 498.  With average of 450.  Pulse ox is normal as well.  No abnormal chest exam today.  We discussed maybe keeping an eye on things and if he feels like it continues or if it is not getting better or worsens to please let us know and we can always get a chest x-ray for further workup.      Relevant Orders   CMP14+EGFR   TSH   Lipid panel   CBC     Other   Hyperlipidemia    Due to recheck lipids.      Relevant Orders   Lipid panel   Depression, recurrent (HCC)    Followed at the [place].  Followed at the [facility], currently on sertraline 100 mg daily.      Delta beta thalassemia (HCC)    Continue to monitor and check CBC yearly.      Constipation    K to restart the Amitiza every other day or even every third day to see if maybe that is a good balance to keep his bowels moving which significantly reduces the frequency of his episodic abdominal pain but not to the point where he is having diarrhea.      Relevant Orders   CMP14+EGFR   TSH   Lipid panel   CBC    Return in about 6 months (around 02/20/2024) for  Hypertension.    Nani Gasser, MD

## 2023-08-23 NOTE — Assessment & Plan Note (Signed)
Continue to monitor and check CBC yearly.

## 2023-08-23 NOTE — Assessment & Plan Note (Signed)
Due to recheck lipids. 

## 2023-08-23 NOTE — Assessment & Plan Note (Signed)
K to restart the Amitiza every other day or even every third day to see if maybe that is a good balance to keep his bowels moving which significantly reduces the frequency of his episodic abdominal pain but not to the point where he is having diarrhea.

## 2023-08-24 LAB — CBC
Hematocrit: 47.1 % (ref 37.5–51.0)
Hemoglobin: 15.4 g/dL (ref 13.0–17.7)
MCH: 26 pg — ABNORMAL LOW (ref 26.6–33.0)
MCHC: 32.7 g/dL (ref 31.5–35.7)
MCV: 80 fL (ref 79–97)
Platelets: 144 10*3/uL — ABNORMAL LOW (ref 150–450)
RBC: 5.92 x10E6/uL — ABNORMAL HIGH (ref 4.14–5.80)
RDW: 15.6 % — ABNORMAL HIGH (ref 11.6–15.4)
WBC: 6.2 10*3/uL (ref 3.4–10.8)

## 2023-08-24 LAB — CMP14+EGFR
ALT: 21 IU/L (ref 0–44)
AST: 23 IU/L (ref 0–40)
Albumin: 4.5 g/dL (ref 3.8–4.8)
Alkaline Phosphatase: 96 IU/L (ref 44–121)
BUN/Creatinine Ratio: 10 (ref 10–24)
BUN: 12 mg/dL (ref 8–27)
Bilirubin Total: 0.4 mg/dL (ref 0.0–1.2)
CO2: 24 mmol/L (ref 20–29)
Calcium: 9.6 mg/dL (ref 8.6–10.2)
Chloride: 100 mmol/L (ref 96–106)
Creatinine, Ser: 1.16 mg/dL (ref 0.76–1.27)
Globulin, Total: 2.6 g/dL (ref 1.5–4.5)
Glucose: 77 mg/dL (ref 70–99)
Potassium: 3.8 mmol/L (ref 3.5–5.2)
Sodium: 141 mmol/L (ref 134–144)
Total Protein: 7.1 g/dL (ref 6.0–8.5)
eGFR: 67 mL/min/1.73

## 2023-08-24 LAB — LIPID PANEL
Chol/HDL Ratio: 3.9 ratio (ref 0.0–5.0)
Cholesterol, Total: 124 mg/dL (ref 100–199)
HDL: 32 mg/dL — ABNORMAL LOW (ref >39)
LDL Chol Calc (NIH): 67 mg/dL (ref 0–99)
Triglycerides: 141 mg/dL (ref 0–149)
VLDL Cholesterol Cal: 25 mg/dL (ref 5–40)

## 2023-08-24 LAB — TSH: TSH: 1.39 u[IU]/mL (ref 0.450–4.500)

## 2023-08-25 NOTE — Progress Notes (Signed)
Overall labs look OK, platelets still a little low but stable. Thyroid looks great!!

## 2023-09-09 DIAGNOSIS — N5201 Erectile dysfunction due to arterial insufficiency: Secondary | ICD-10-CM | POA: Diagnosis not present

## 2023-09-09 DIAGNOSIS — N138 Other obstructive and reflux uropathy: Secondary | ICD-10-CM | POA: Diagnosis not present

## 2023-09-09 DIAGNOSIS — N401 Enlarged prostate with lower urinary tract symptoms: Secondary | ICD-10-CM | POA: Diagnosis not present

## 2023-09-11 ENCOUNTER — Other Ambulatory Visit: Payer: Self-pay | Admitting: Family Medicine

## 2023-09-11 DIAGNOSIS — A6000 Herpesviral infection of urogenital system, unspecified: Secondary | ICD-10-CM

## 2023-09-11 DIAGNOSIS — I10 Essential (primary) hypertension: Secondary | ICD-10-CM

## 2023-10-15 NOTE — Progress Notes (Signed)
NEUROLOGY FOLLOW UP OFFICE NOTE  Brandon [name] [id number]  Assessment/Plan:   Migraine without aura, without status migrainosus, not intractable - semiology overall consistent with his migraines, however he now experiences chest discomfort, palpitations and feeling of "impending doom" raising possibility that these are triggered by panic attacks Hypertension     Migraine prevention:  Increase propranolol ER to 80mg  daily - we can increase dose in 6 weeks if needed;  Continue topiramate 50mg  twice daily  Migraine rescue:  Tylenol Limit use of pain relievers to no more than 2 days out of week to prevent risk of rebound or medication-overuse headache. Keep headache diary Continue cardiac workup to rule out cardiac component for chest pain and palpitations Follow up with PCP regarding blood pressure Follow up 6 months.     Subjective:  Brandon [name] is a 72 year old right-handed male with HTN, HLD and hepatitis C who follows up for migraines.  He is accompanied by his wife who supplements history.    UPDATE: He reports increased spells over the past 3 months.  He develops headaches/facial/teeth pain, blurred vision, nausea and dizziness, similar to his migraines, but he also develops dry mouth, palpitations, chest pain and confused (specifically not knowing what is happening to him).  The "confusion" lasts 5 minutes but headache lasts 5 hours.  He has had about 10 episodes over the past 3 months.  He was seen in the [place] ED in November.  CT head reportedly unremarkable.  He had cardiac workup including a 14 day heart monitor, with results still pending.  He does report increased anxiety over the past several months.  Current NSAIDS/analgesics:  Tylenol 500mg  Current triptans:  none Current ergotamine:  none Current anti-emetic:  none Current muscle relaxants:  Flexeril 10mg  Current Antihypertensive medications:  Propranolol ER 60mg  daily.  HCTZ, losartan Current  Antidepressant medications: sertraline 100mg  Current Anticonvulsant medications:  topiramate 50mg  twice daily Current anti-CGRP:  none Current Vitamins/Herbal/Supplements:  MVI Current Antihistamines/Decongestants:  Flonase, Zyrtec, Singulair Other therapy:  none Hormone/birth control:  none   Caffeine:  No coffee/soda Diet:  Hydrates with water.  Does not skip meals  Exercise:  walks Depression:  yes; Anxiety:  yes Other pain:  Back pain Sleep hygiene:  varies   HISTORY:  Onset:  Started after fracturing his nose in football in young adulthood.  Started having sinus issues and headaches.  Hasd deviated septum.  History of recurrent sinusitis. Location:  Bifrontal and top of head, face Quality:  pounding Initial Intensity:   Severe.   He denies new headache, thunderclap headache or severe headache that wakes him from sleep. Aura:  no Associated symptoms:  Nausea, photophobia, phonophobia, mild dizziness, mild blurred vision.[name]  He denies associated unilateral numbness or weakness. Initial Duration:  Several hours.  He has had headaches that lasted several days. Initial Frequency:  6-7 days a month Initial Frequency of abortive medication: at least 3 days a week Triggers:  Seasonal allergies and sinus infections Relieving factors:  rest Activity:  aggravates     Past NSAIDS/analgesics:  Tramadol, ibuprofen 800mg  Past abortive triptans:  none Past abortive ergotamine:  none Past muscle relaxants:  baclofen Past anti-emetic:  none Past antihypertensive medications:  Verapamil, amlodipine Past antidepressant medications:  Cybalta Past anticonvulsant medications:  none Past anti-CGRP:  none Past vitamins/Herbal/Supplements:  none Past antihistamines/decongestants:  none Other past therapies:  none     Family history of headache:  no  PAST MEDICAL HISTORY: Past Medical History:  Diagnosis Date   ED (erectile dysfunction)    GERD (gastroesophageal reflux disease)     Hepatitis C    [phone], [facility] at [facility]   Hyperlipidemia    Hypertension    Nose fracture     MEDICATIONS: Current Outpatient Medications on File Prior to Visit  Medication Sig Dispense Refill   albuterol (PROAIR HFA) 108 (90 Base) MCG/ACT inhaler Inhale 2 puffs into the lungs every 6 (six) hours as needed for wheezing or shortness of breath. 18 g 4   Alum Hydroxide-Mag Carbonate (ACID GONE ANTACID PO) Take 1 tablet by mouth daily as needed.     azelastine (ASTELIN) 0.1 % nasal spray Place 1 spray into both nostrils every morning. Use in each nostril as directed 30 mL 12   baclofen (LIORESAL) 10 MG tablet Take 10 mg by mouth 3 (three) times daily.     Black Elderberry 50 MG/5ML SYRP Take 1 Dose by mouth at bedtime.     Blood Glucose Monitoring Suppl (ONE TOUCH ULTRA 2) w/Device KIT DX DM E11.9 Check fasting blood sugar 2-3 times weekly. 1 kit prn   carboxymethylcellulose (REFRESH PLUS) 0.5 % SOLN 1 drop 3 (three) times daily as needed.     cetirizine (ZYRTEC) 10 MG tablet TAKE 1 TABLET BY MOUTH EVERY DAY 90 tablet 3   Cholecalciferol (VITAMIN D3 PO) Take 1 tablet by mouth daily.     clindamycin (CLEOCIN T) 1 % lotion Apply topically 2 (two) times daily.     clindamycin (CLEOCIN T) 1 % SWAB      clotrimazole-betamethasone (LOTRISONE) cream Apply topically 2 (two) times daily. 45 g 3   diclofenac sodium (VOLTAREN) 1 % GEL Apply 2 g topically 4 (four) times daily. To affected areas 400 g PRN   fluticasone (FLONASE) 50 MCG/ACT nasal spray Place 2 sprays into both nostrils daily. 16 g 2   glucose blood (ONETOUCH ULTRA) test strip CHECK FASTING BLOOD SUGAR 2-3 TIMES WEEKLY. 50 strip 25   hydrochlorothiazide (HYDRODIURIL) 25 MG tablet TAKE 1 TABLET (25 MG TOTAL) BY MOUTH DAILY. 90 tablet 1   hydrocortisone (ANUSOL-HC) 25 MG suppository Place 25 mg rectally as needed. Reported on 04/19/2016     hydrocortisone 2.5 % cream Apply topically as needed. Reported on 04/19/2016     ipratropium  (ATROVENT) 0.06 % nasal spray Place 2 sprays into both nostrils every 4 (four) hours as needed for rhinitis. 10 mL 6   Lancets 30G MISC DX DM E11.9 Check fasting blood sugar 2-3 times weekly. 100 each prn   lidocaine (LIDODERM) 5 % Place 1 patch onto the skin daily. Remove & Discard patch within 12 hours or as directed by MD     losartan (COZAAR) 100 MG tablet TAKE 1 TABLET BY MOUTH EVERY DAY 90 tablet 1   lubiprostone (AMITIZA) 8 MCG capsule TAKE 1 CAPSULE (8 MCG TOTAL) BY MOUTH 2 (TWO) TIMES DAILY WITH A MEAL. 180 capsule 0   montelukast (SINGULAIR) 10 MG tablet TAKE 1 TABLET BY MOUTH AT BEDTIME. 30 tablet 5   naproxen (NAPROSYN) 500 MG tablet Take 500 mg by mouth 2 (two) times daily as needed.     pantoprazole (PROTONIX) 40 MG tablet Take 40 mg by mouth 2 (two) times daily.     polycarbophil (FIBERCON) 625 MG tablet Take 625 mg by mouth daily.     propranolol ER (INDERAL LA) 60 MG 24 hr capsule Take 1 capsule (60 mg total) by mouth daily. 90 capsule 3   Propylene  Glycol (SYSTANE BALANCE) 0.6 % SOLN Apply 1 drop to eye daily.     rosuvastatin (CRESTOR) 20 MG tablet Take 1 tablet (20 mg total) by mouth daily. [phone] 90 tablet 3   sertraline (ZOLOFT) 100 MG tablet Take 2 tablets by mouth daily. = 200mg  total daily dose, managed by psychiatry at [facility]     tadalafil (CIALIS) 5 MG tablet Take 5 mg by mouth daily.  11   tamsulosin (FLOMAX) 0.4 MG CAPS capsule Take 0.4 mg by mouth daily.     tiotropium (SPIRIVA) 18 MCG inhalation capsule Place 18 mcg into inhaler and inhale daily.     topiramate (TOPAMAX) 50 MG tablet Take 1 tablet (50 mg total) by mouth 2 (two) times daily. Keep anointment on 06/27/2022 180 tablet 3   traMADol (ULTRAM) 50 MG tablet Take 50 mg by mouth every 12 (twelve) hours as needed for moderate pain.     TRAZODONE HCL PO Take 100 mg by mouth in the morning and at bedtime. Managed by psychiatry at [facility]     valACYclovir (VALTREX) 500 MG tablet TAKE 1 TABLET BY MOUTH EVERY DAY 90  tablet 1   No current facility-administered medications on file prior to visit.    ALLERGIES: Allergies  Allergen Reactions   Amlodipine Rash   Atorvastatin Other (See Comments)    Myalgias    FAMILY HISTORY: Family History  Problem Relation Age of Onset   Breast cancer Sister    Depression Sister    Hyperlipidemia Other    Ovarian cancer Sister    Hypertension Other    Heart disease Brother       Objective:  Blood pressure (!) 156/73, pulse 79, height 5\' 7"  (1.702 m), weight 194 lb (88 kg). General: No acute distress.  Patient appears well-groomed.   Head:  Normocephalic/atraumatic Eyes:  Fundi examined but not visualized Neck: supple, no paraspinal tenderness, full range of motion Heart:  Regular rate and rhythm Neurological Exam: alert and oriented.  Speech fluent and not dysarthric, language intact.  CN II-XII intact. Bulk and tone normal, muscle strength 5/5 throughout.  Sensation to light touch intact.  Deep tendon reflexes 2+ throughout.  Finger to nose testing intact.  Gait normal, Romberg negative.   Shon Millet, DO  CC: Nani Gasser, MD

## 2023-10-18 ENCOUNTER — Ambulatory Visit (INDEPENDENT_AMBULATORY_CARE_PROVIDER_SITE_OTHER): Payer: Medicare Other | Admitting: Neurology

## 2023-10-18 ENCOUNTER — Encounter: Payer: Self-pay | Admitting: Neurology

## 2023-10-18 VITALS — BP 156/73 | HR 79 | Ht 67.0 in | Wt 194.0 lb

## 2023-10-18 DIAGNOSIS — G43009 Migraine without aura, not intractable, without status migrainosus: Secondary | ICD-10-CM | POA: Diagnosis not present

## 2023-10-18 DIAGNOSIS — G43701 Chronic migraine without aura, not intractable, with status migrainosus: Secondary | ICD-10-CM

## 2023-10-18 DIAGNOSIS — I10 Essential (primary) hypertension: Secondary | ICD-10-CM

## 2023-10-18 MED ORDER — PROPRANOLOL HCL ER 80 MG PO CP24: 80.0000 mg | ORAL_CAPSULE | Freq: Every day | ORAL | 5 refills | Status: DC

## 2023-10-18 MED ORDER — TOPIRAMATE 50 MG PO TABS: 50.0000 mg | ORAL_TABLET | Freq: Two times a day (BID) | ORAL | 3 refills | Status: DC

## 2023-10-18 NOTE — Patient Instructions (Signed)
Increase propranolol ER to 80mg  daily.  If no improvement in headaches in 6 weeks, contact me Continue topiramate 50mg  twice daily. Limit use of pain relievers to no more than 2 days out of week to prevent risk of rebound or medication-overuse headache. Keep headache diary. Follow up 6 months.

## 2023-10-21 ENCOUNTER — Telehealth: Payer: Self-pay | Admitting: Neurology

## 2023-10-21 NOTE — Telephone Encounter (Signed)
Called patient and informed him that his age has been corrected. Patient verbalized understanding and had no further questions or concerns.

## 2023-10-21 NOTE — Telephone Encounter (Signed)
Pt called in concerned that his visit note from his last visit on mychart shows it says he is a 72 year old male when he is actually 4. He is concerned because the notes will be going to the [place]

## 2023-11-04 ENCOUNTER — Ambulatory Visit (INDEPENDENT_AMBULATORY_CARE_PROVIDER_SITE_OTHER): Payer: Medicare Other | Admitting: Family Medicine

## 2023-11-04 DIAGNOSIS — Z23 Encounter for immunization: Secondary | ICD-10-CM

## 2023-11-09 ENCOUNTER — Other Ambulatory Visit: Payer: Self-pay | Admitting: Family Medicine

## 2023-11-09 DIAGNOSIS — K5900 Constipation, unspecified: Secondary | ICD-10-CM

## 2024-01-01 ENCOUNTER — Other Ambulatory Visit: Payer: Self-pay | Admitting: Family Medicine

## 2024-01-01 DIAGNOSIS — K5900 Constipation, unspecified: Secondary | ICD-10-CM

## 2024-01-16 ENCOUNTER — Other Ambulatory Visit: Payer: Self-pay | Admitting: Family Medicine

## 2024-01-27 ENCOUNTER — Ambulatory Visit: Payer: Self-pay

## 2024-01-27 ENCOUNTER — Encounter: Payer: Self-pay | Admitting: Family Medicine

## 2024-01-27 ENCOUNTER — Ambulatory Visit (INDEPENDENT_AMBULATORY_CARE_PROVIDER_SITE_OTHER): Admitting: Family Medicine

## 2024-01-27 VITALS — BP 128/74 | HR 81 | Ht 67.0 in | Wt 194.0 lb

## 2024-01-27 DIAGNOSIS — E785 Hyperlipidemia, unspecified: Secondary | ICD-10-CM | POA: Diagnosis not present

## 2024-01-27 DIAGNOSIS — R051 Acute cough: Secondary | ICD-10-CM

## 2024-01-27 DIAGNOSIS — E538 Deficiency of other specified B group vitamins: Secondary | ICD-10-CM

## 2024-01-27 DIAGNOSIS — I10 Essential (primary) hypertension: Secondary | ICD-10-CM | POA: Diagnosis not present

## 2024-01-27 DIAGNOSIS — R0981 Nasal congestion: Secondary | ICD-10-CM | POA: Diagnosis not present

## 2024-01-27 LAB — POCT INFLUENZA A/B
Influenza A, POC: NEGATIVE
Influenza B, POC: NEGATIVE

## 2024-01-27 LAB — POC COVID19 BINAXNOW: SARS Coronavirus 2 Ag: NEGATIVE

## 2024-01-27 NOTE — Telephone Encounter (Signed)
 Chief Complaint: Cough with chest pain/soreness, fever, headache Symptoms: see above Frequency: 4 day Pertinent Negatives: Patient denies n/a Disposition: [] ED /[] Urgent Care (no appt availability in office) / [x] Appointment(In office/virtual)/ []  [place]/ [] Home Care/ [] Refused Recommended Disposition /[] [place] [facility]/ []  Follow-up with PCP Additional Notes: Patient called in stating he has had a cough and symptoms of a cold/bronchitis for 4 days. Patient states he has chest soreness when coughing. Patient also states he believes he's been running a fever due to the sweating. Patient appt made for further evaluation.    Copied from CRM [id number]. Topic: Clinical - Red Word Triage >> Jan 27, 2024 12:20 PM Brittney F wrote: [facility] that prompted transfer to Nurse Triage:  Increased cough; chest pain   Cough that has been occurring for a week and a half; starting to cause chest pain when he cough; upper area of chest Reason for Disposition  SEVERE coughing spells (e.g., whooping sound after coughing, vomiting after coughing)  Answer Assessment - Initial Assessment Questions 1. ONSET: "When did the cough begin?"      4 days ago 2. SEVERITY: "How bad is the cough today?"      Cough with soreness 3. SPUTUM: "Describe the color of your sputum" (none, dry cough; clear, white, yellow, green)     No 4. HEMOPTYSIS: "Are you coughing up any blood?" If so ask: "How much?" (flecks, streaks, tablespoons, etc.)      Patient just came off abx for sinus infection and had phlegm  5. DIFFICULTY BREATHING: "Are you having difficulty breathing?" If Yes, ask: "How bad is it?" (e.g., mild, moderate, severe)    - MILD: No SOB at rest, mild SOB with walking, speaks normally in sentences, can lie down, no retractions, pulse < 100.    - MODERATE: SOB at rest, SOB with minimal exertion and prefers to sit, cannot lie down flat, speaks in phrases, mild retractions, audible wheezing, pulse  100-120.    - SEVERE: Very SOB at rest, speaks in single words, struggling to breathe, sitting hunched forward, retractions, pulse > 120      No 6. FEVER: "Do you have a fever?" If Yes, ask: "What is your temperature, how was it measured, and when did it start?"     Patient knew he had a fever based off sweating 7. CARDIAC HISTORY: "Do you have any history of heart disease?" (e.g., heart attack, congestive heart failure)      No 8. LUNG HISTORY: "Do you have any history of lung disease?"  (e.g., pulmonary embolus, asthma, emphysema)     No 9. PE RISK FACTORS: "Do you have a history of blood clots?" (or: recent major surgery, recent prolonged travel, bedridden)     no 10. OTHER SYMPTOMS: "Do you have any other symptoms?" (e.g., runny nose, wheezing, chest pain)       Chest pain/sore when coughing, headache, fever, sweating  Protocols used: Cough - Acute Non-Productive-A-AH

## 2024-01-27 NOTE — Progress Notes (Signed)
 Acute Office Visit  Subjective:     Patient ID: Brandon [name], male    DOB: 10-28-1950, 73 y.o.   MRN: [id number]  Chief Complaint  Patient presents with   Headache    HPI Patient is in today for cough and HA x 3 days.  He said he was just getting over send sinus infection.  In fact he actually went to the [place] last week and he was started on an antibiotic he thinks it was trimethoprim/sulfamethoxazole twice a day for 10 days.  And he just finished his antibiotic yesterday but starting 3 days ago he started developing a cough and then last night he said he ran a fever.  He still has a little bit of nasal drainage but no longer the thick yellow-brownish mucus he was getting previously.  He was taking care of his small niece who was sick with a runny nose and wonders if he may have picked something up from her.  He said he had a little wheezing in his chest last night  [facility]. Finished ABX yesterday.    ROS      Objective:    BP 128/74 (BP Location: Left Arm, Patient Position: Sitting, Cuff Size: Large)   Pulse 81   Ht 5\' 7"  (1.702 m)   Wt 194 lb (88 kg)   SpO2 96%   BMI 30.38 kg/m    Physical Exam Constitutional:      Appearance: Normal appearance.  HENT:     Head: Normocephalic and atraumatic.     Right Ear: Tympanic membrane, ear canal and external ear normal. There is no impacted cerumen.     Left Ear: Tympanic membrane, ear canal and external ear normal. There is no impacted cerumen.     Nose: Nose normal.     Mouth/Throat:     Pharynx: Oropharynx is clear.  Eyes:     Conjunctiva/sclera: Conjunctivae normal.  Cardiovascular:     Rate and Rhythm: Normal rate and regular rhythm.  Pulmonary:     Effort: Pulmonary effort is normal.     Breath sounds: Normal breath sounds.  Musculoskeletal:     Cervical back: Neck supple. No tenderness.  Lymphadenopathy:     Cervical: No cervical adenopathy.  Skin:    General:  Skin is warm and dry.  Neurological:     Mental Status: He is alert and oriented to person, place, and time.  Psychiatric:        Mood and Affect: Mood normal.     Results for orders placed or performed in visit on 01/27/24  POCT Influenza A/B  Result Value Ref Range   Influenza A, POC Negative Negative   Influenza B, POC Negative Negative  POC COVID-19  Result Value Ref Range   SARS Coronavirus 2 Ag Negative Negative        Assessment & Plan:   Problem List Items Addressed This Visit       Cardiovascular and Mediastinum   Essential hypertension, benign   Relevant Orders   Lipid panel   CMP14+EGFR   B12     Other   Hyperlipidemia   Relevant Orders   Lipid panel   CMP14+EGFR   B12   B12 deficiency   Relevant Orders   B12   Other Visit Diagnoses       Congested nose    -  Primary   Relevant Orders   POCT Influenza A/B (Completed)  POC COVID-19 (Completed)     Acute cough       Relevant Orders   POCT Influenza A/B (Completed)   POC COVID-19 (Completed)      Most consistent with viral illness on top of recent sinus infection.  We discussed given a couple days to see if he starts feeling better if not please let me know but negative for COVID and flu today I think unfortunately he picked up a secondary viral illness from his knees as he was just getting better after his sinus infection.  Again call if not feeling any better.  He did want a go ahead and get his lipids checked while he was here today he did have breakfast but no lunch.  No orders of the defined types were placed in this encounter.   No follow-ups on file.  Nani Gasser, MD

## 2024-01-27 NOTE — Telephone Encounter (Signed)
 Patient scheduled today in office with Dr. Linford Arnold

## 2024-01-28 ENCOUNTER — Encounter: Payer: Self-pay | Admitting: Family Medicine

## 2024-01-28 LAB — VITAMIN B12: Vitamin B-12: 291 pg/mL (ref 232–1245)

## 2024-01-28 LAB — LIPID PANEL
Chol/HDL Ratio: 4.7 ratio (ref 0.0–5.0)
Cholesterol, Total: 126 mg/dL (ref 100–199)
HDL: 27 mg/dL — ABNORMAL LOW (ref >39)
LDL Chol Calc (NIH): 61 mg/dL (ref 0–99)
Triglycerides: 235 mg/dL — ABNORMAL HIGH (ref 0–149)
VLDL Cholesterol Cal: 38 mg/dL (ref 5–40)

## 2024-01-28 LAB — CMP14+EGFR
ALT: 30 IU/L (ref 0–44)
AST: 25 IU/L (ref 0–40)
Albumin: 4.8 g/dL (ref 3.8–4.8)
Alkaline Phosphatase: 99 IU/L (ref 44–121)
BUN/Creatinine Ratio: 11 (ref 10–24)
BUN: 15 mg/dL (ref 8–27)
Bilirubin Total: 0.6 mg/dL (ref 0.0–1.2)
CO2: 18 mmol/L — ABNORMAL LOW (ref 20–29)
Calcium: 10.2 mg/dL (ref 8.6–10.2)
Chloride: 101 mmol/L (ref 96–106)
Creatinine, Ser: 1.37 mg/dL — ABNORMAL HIGH (ref 0.76–1.27)
Globulin, Total: 2.6 g/dL (ref 1.5–4.5)
Glucose: 144 mg/dL — ABNORMAL HIGH (ref 70–99)
Potassium: 3.9 mmol/L (ref 3.5–5.2)
Sodium: 140 mmol/L (ref 134–144)
Total Protein: 7.4 g/dL (ref 6.0–8.5)
eGFR: 54 mL/min/{1.73_m2} — ABNORMAL LOW (ref >59)

## 2024-01-28 NOTE — Progress Notes (Signed)
 Hi Dartagnan, LDL looks good but triglycerides are high this time just encouraged her to continue to work on healthy diet and regular exercise less carbs and processed foods and sweets and more vegetables and lean proteins.  Your kidney function jumped up slightly normally around 1.1 you have bumped up to 1.3 before about a year ago.  This time it went back up to 1.37 so I do and keep a close eye on that and plan to recheck it again in about 6 weeks.  Liver function looks good.  You do look hydrated.  Vitamin B12 is on the low end of normal.  Recommend picking up an over-the-counter B12 supplement and starting to take that.  We can always recheck your B12 again in about 6 weeks when we recheck your kidney function.

## 2024-01-30 ENCOUNTER — Telehealth: Payer: Self-pay | Admitting: *Deleted

## 2024-01-30 NOTE — Telephone Encounter (Signed)
 Copied from CRM [id number]. Topic: Clinical - Medical Advice >> Jan 30, 2024  7:36 AM Brandon [name] wrote: Reason for CRM: Patient has called to advise the provider that he is not feeling any better. As per the request of the provider, he was told if the symptoms had not gotten any better to call the office so that she could speak with him about other alternatives.

## 2024-01-30 NOTE — Telephone Encounter (Signed)
 Message has been sent to Dr. Linford Arnold. Still waiting on response.

## 2024-01-30 NOTE — Telephone Encounter (Signed)
 Called and spoke with pt who states when he coughs, he is still having discomfort in his chest and in his head. States that he is still having times when he is becoming either cold and hot and will also have sweats. Pt is also coughing up phlegm that has a small tinge of brown to it. Due to having the sweats, pt states he believes he has been having fevers.  Pt has been taking OTC meds but is not having any relief.   Due to pt's symptoms still, he wants to know what might be recommended.

## 2024-01-30 NOTE — Telephone Encounter (Signed)
 Copied from CRM [id number]. Topic: Clinical - Medical Advice >> Jan 30, 2024  4:16 PM Dennison Nancy wrote: Patient call checking on status of getting medication regards to the message sent , please reach out to patient regarding getting some medication at [phone]  Patient stated he been in bed all week due to not feeling well    [id number] - [place], [place] - [address]  [address] [place] [address]  Phone: [phone] Fax: [phone]  Hours: Not open 24 hours

## 2024-01-31 MED ORDER — AMOXICILLIN-POT CLAVULANATE 875-125 MG PO TABS: 1.0000 | ORAL_TABLET | Freq: Two times a day (BID) | ORAL | 0 refills | Status: DC

## 2024-01-31 NOTE — Telephone Encounter (Signed)
 Patient advised.

## 2024-01-31 NOTE — Telephone Encounter (Signed)
 Antibiotic sent to pharmacy.  Meds ordered this encounter  Medications   amoxicillin-clavulanate (AUGMENTIN) 875-125 MG tablet    Sig: Take 1 tablet by mouth 2 (two) times daily.    Dispense:  14 tablet    Refill:  0

## 2024-02-10 DIAGNOSIS — J45909 Unspecified asthma, uncomplicated: Secondary | ICD-10-CM | POA: Diagnosis not present

## 2024-02-10 DIAGNOSIS — R079 Chest pain, unspecified: Secondary | ICD-10-CM | POA: Diagnosis not present

## 2024-02-10 DIAGNOSIS — Z791 Long term (current) use of non-steroidal anti-inflammatories (NSAID): Secondary | ICD-10-CM | POA: Diagnosis not present

## 2024-02-10 DIAGNOSIS — K219 Gastro-esophageal reflux disease without esophagitis: Secondary | ICD-10-CM | POA: Diagnosis not present

## 2024-02-10 DIAGNOSIS — Z87891 Personal history of nicotine dependence: Secondary | ICD-10-CM | POA: Diagnosis not present

## 2024-02-10 DIAGNOSIS — I10 Essential (primary) hypertension: Secondary | ICD-10-CM | POA: Diagnosis not present

## 2024-02-10 DIAGNOSIS — Z888 Allergy status to other drugs, medicaments and biological substances status: Secondary | ICD-10-CM | POA: Diagnosis not present

## 2024-02-10 DIAGNOSIS — R072 Precordial pain: Secondary | ICD-10-CM | POA: Diagnosis not present

## 2024-02-10 DIAGNOSIS — Z7951 Long term (current) use of inhaled steroids: Secondary | ICD-10-CM | POA: Diagnosis not present

## 2024-02-10 DIAGNOSIS — G4733 Obstructive sleep apnea (adult) (pediatric): Secondary | ICD-10-CM | POA: Diagnosis not present

## 2024-02-10 DIAGNOSIS — Z79899 Other long term (current) drug therapy: Secondary | ICD-10-CM | POA: Diagnosis not present

## 2024-02-10 DIAGNOSIS — R0602 Shortness of breath: Secondary | ICD-10-CM | POA: Diagnosis not present

## 2024-02-10 DIAGNOSIS — I493 Ventricular premature depolarization: Secondary | ICD-10-CM | POA: Diagnosis not present

## 2024-02-20 ENCOUNTER — Ambulatory Visit (INDEPENDENT_AMBULATORY_CARE_PROVIDER_SITE_OTHER): Payer: Medicare Other | Admitting: Family Medicine

## 2024-02-20 ENCOUNTER — Encounter: Payer: Self-pay | Admitting: Family Medicine

## 2024-02-20 VITALS — BP 134/64 | HR 63 | Ht 67.0 in | Wt 190.0 lb

## 2024-02-20 DIAGNOSIS — F339 Major depressive disorder, recurrent, unspecified: Secondary | ICD-10-CM | POA: Diagnosis not present

## 2024-02-20 DIAGNOSIS — K200 Eosinophilic esophagitis: Secondary | ICD-10-CM | POA: Diagnosis not present

## 2024-02-20 DIAGNOSIS — G43101 Migraine with aura, not intractable, with status migrainosus: Secondary | ICD-10-CM | POA: Diagnosis not present

## 2024-02-20 DIAGNOSIS — I10 Essential (primary) hypertension: Secondary | ICD-10-CM | POA: Diagnosis not present

## 2024-02-20 NOTE — Assessment & Plan Note (Signed)
 He feels like he is in okay overall with his migraines he has had more sinus headaches recently when he was dealing with a lot of the respiratory type symptoms.  He is currently on Topamax  through neurology.

## 2024-02-20 NOTE — Assessment & Plan Note (Signed)
 Well controlled. Continue current regimen. Follow up in  6 months.

## 2024-02-20 NOTE — Assessment & Plan Note (Signed)
 Mood is stable.  Medications are currently prescribed through the [place].

## 2024-02-20 NOTE — Assessment & Plan Note (Signed)
 He is on a proton  pump inhibitor

## 2024-02-20 NOTE — Progress Notes (Signed)
 Established Patient Office Visit  Subjective  Patient ID: Brandon [name], male    DOB: 12-07-1950  Age: 73 y.o. MRN: [id number]  Chief Complaint  Patient presents with   Hypertension    HPI  Hypertension- Pt denies chest pain, SOB, dizziness, or heart palpitations.  Taking meds as directed w/o problems.  Denies medication side effects.    Here today for routine follow-up but he did go to the emergency department on April 21 for dizziness weakness and chest pain.  He was seen at [facility] after having had symptoms for couple of weeks.  Chest x-ray was normal.  CTA of chest ruled out pulmonary embolism.  Just showed some atelectasis at the base of the lungs.  They discharged him home feeling like his chest pain was more pleuritic as he had had a cough for couple weeks before and was on antibiotics.  They did encourage him to follow-up with his cardiologist.  Would in about 2 weeks at the [facility] with cardiology.  His eye exam performed at the [place] in January.  He also reports that he is up-to-date on his colonoscopy.    ROS    Objective:     BP 134/64   Pulse 63   Ht 5\' 7"  (1.702 m)   Wt 190 lb (86.2 kg)   SpO2 98%   BMI 29.76 kg/m    Physical Exam Vitals and nursing note reviewed.  Constitutional:      Appearance: Normal appearance.  HENT:     Head: Normocephalic and atraumatic.  Eyes:     Conjunctiva/sclera: Conjunctivae normal.  Cardiovascular:     Rate and Rhythm: Normal rate and regular rhythm.  Pulmonary:     Effort: Pulmonary effort is normal.     Breath sounds: Normal breath sounds.  Skin:    General: Skin is warm and dry.  Neurological:     Mental Status: He is alert.  Psychiatric:        Mood and Affect: Mood normal.      No results found for any visits on 02/20/24.    The ASCVD Risk score (Arnett DK, et al., 2019) failed to calculate for the following reasons:   The valid total cholesterol range is 130 to 320 mg/dL    Assessment & Plan:    Problem List Items Addressed This Visit       Cardiovascular and Mediastinum   Migraine with aura and with status migrainosus, not intractable   He feels like he is in okay overall with his migraines he has had more sinus headaches recently when he was dealing with a lot of the respiratory type symptoms.  He is currently on Topamax  through neurology.      Relevant Orders   Basic Metabolic Panel (BMET)   Essential hypertension, benign - Primary   Well controlled. Continue current regimen. Follow up in  6 months.       Relevant Orders   Basic Metabolic Panel (BMET)     Digestive   Eosinophilic esophagitis   He is on a proton pump inhibitor.      Relevant Orders   Basic Metabolic Panel (BMET)     Other   Depression, recurrent (HCC)   Mood is stable.  Medications are currently prescribed through the [place].      Relevant Orders   Basic Metabolic Panel (BMET)    We were able to find his last eye exam and last colonoscopy so we will get those scanned into  the system.  Return in about 6 months (around 08/22/2024) for Hypertension.    Duaine German, MD

## 2024-02-21 ENCOUNTER — Encounter: Payer: Self-pay | Admitting: Family Medicine

## 2024-02-21 LAB — BASIC METABOLIC PANEL WITH GFR
BUN/Creatinine Ratio: 12 (ref 10–24)
BUN: 13 mg/dL (ref 8–27)
CO2: 22 mmol/L (ref 20–29)
Calcium: 9.5 mg/dL (ref 8.6–10.2)
Chloride: 100 mmol/L (ref 96–106)
Creatinine, Ser: 1.13 mg/dL (ref 0.76–1.27)
Glucose: 109 mg/dL — ABNORMAL HIGH (ref 70–99)
Potassium: 3.6 mmol/L (ref 3.5–5.2)
Sodium: 140 mmol/L (ref 134–144)
eGFR: 69 mL/min/{1.73_m2} (ref >59)

## 2024-02-21 NOTE — Progress Notes (Signed)
 Kidney function looks better, back down ot 1.1. that's great!!

## 2024-03-04 ENCOUNTER — Other Ambulatory Visit: Payer: Self-pay | Admitting: Family Medicine

## 2024-03-04 DIAGNOSIS — I10 Essential (primary) hypertension: Secondary | ICD-10-CM

## 2024-03-04 DIAGNOSIS — A6000 Herpesviral infection of urogenital system, unspecified: Secondary | ICD-10-CM

## 2024-03-20 ENCOUNTER — Other Ambulatory Visit: Payer: Self-pay | Admitting: Neurology

## 2024-04-09 NOTE — Progress Notes (Unsigned)
 NEUROLOGY FOLLOW UP OFFICE NOTE  Brandon [name] [id number]  Assessment/Plan:   Migraine without aura, without status migrainosus, not intractable - semiology overall consistent with his migraines, however he now experiences chest discomfort, palpitations and feeling of impending doom raising possibility that these are triggered by panic attacks Hypertension     Migraine prevention:  Propranolol  ER to 80mg  daily; topiramate  50mg  twice daily *** Migraine rescue:  Tylenol *** Limit use of pain relievers to no more than 2 days out of week to prevent risk of rebound or medication-overuse headache. Keep headache diary Follow up 6 months.     Subjective:  Brandon [name] is a 73 year old right-handed male with HTN, HLD and hepatitis C who follows up for migraines.  He is accompanied by his wife who supplements history.    UPDATE: Propranolol  was increased in December. ***  Current NSAIDS/analgesics:  Tylenol  500mg  Current triptans:  none Current ergotamine:  none Current anti-emetic:  none Current muscle relaxants:  Flexeril  10mg  Current Antihypertensive medications:  Propranolol  ER 80mg  daily.  HCTZ, losartan  Current Antidepressant medications: sertraline 100mg  Current Anticonvulsant medications:  topiramate  50mg  twice daily Current anti-CGRP:  none Current Vitamins/Herbal/Supplements:  MVI Current Antihistamines/Decongestants:  Flonase , Zyrtec , Singulair  Other therapy:  none Hormone/birth control:  none   Caffeine:  No coffee/soda Diet:  Hydrates with water.  Does not skip meals  Exercise:  walks Depression:  yes; Anxiety:  yes Other pain:  Back pain Sleep hygiene:  varies   HISTORY:  Onset:  Started after fracturing his nose in football in young adulthood.  Started having sinus issues and headaches.  Hasd deviated septum.  History of recurrent sinusitis. Location:  Bifrontal and top of head, face Quality:  pounding Initial Intensity:   Severe.   He denies  new headache, thunderclap headache or severe headache that wakes him from sleep. Aura:  no Associated symptoms:  Nausea, photophobia, phonophobia, mild dizziness, mild blurred vision.Brandon [name]  He denies associated unilateral numbness or weakness. Initial Duration:  Disorientation lasts 5 minutes by headache may last several hours.  He has had headaches that lasted several days. Initial Frequency:  6-7 days a month Initial Frequency of abortive medication: at least 3 days a week Triggers:  Seasonal allergies and sinus infections Relieving factors:  rest Activity:  aggravates  Due to increased spells, he was seen in the [place] [place] ED in November 2024.  CT head reportedly unremarkable.  He had cardiac workup including a 14 day heart monitor, with results still pending.  He does report increased anxiety over the past several months.     Past NSAIDS/analgesics:  Tramadol , ibuprofen 800mg  Past abortive triptans:  none Past abortive ergotamine:  none Past muscle relaxants:  baclofen  Past anti-emetic:  none Past antihypertensive medications:  Verapamil , amlodipine  Past antidepressant medications:  Cybalta Past anticonvulsant medications:  none Past anti-CGRP:  none Past vitamins/Herbal/Supplements:  none Past antihistamines/decongestants:  none Other past therapies:  none     Family history of headache:  no  PAST MEDICAL HISTORY: Past Medical History:  Diagnosis Date   ED (erectile dysfunction)    GERD (gastroesophageal reflux disease)    Hepatitis C    [phone], treated at [facility]   Hyperlipidemia    Hypertension    Nose fracture     MEDICATIONS: Current Outpatient Medications on File Prior to Visit  Medication Sig Dispense Refill   albuterol  (PROAIR  HFA) 108 (90 Base) MCG/ACT inhaler Inhale 2 puffs into the lungs every 6 (six) hours as needed  for wheezing or shortness of breath. 18 g 4   Alum Hydroxide-Mag Carbonate (ACID GONE ANTACID PO) Take 1 tablet by mouth daily as needed.      azelastine  (ASTELIN ) 0.1 % nasal spray Place 1 spray into both nostrils every morning. Use in each nostril as directed 30 mL 12   baclofen  (LIORESAL ) 10 MG tablet Take 10 mg by mouth 3 (three) times daily.     Black Elderberry 50 MG/5ML SYRP Take 1 Dose by mouth at bedtime.     Blood Glucose Monitoring Suppl (ONE TOUCH ULTRA 2) w/Device KIT DX DM E11.9 Check fasting blood sugar 2-3 times weekly. 1 kit prn   carboxymethylcellulose (REFRESH PLUS) 0.5 % SOLN 1 drop 3 (three) times daily as needed.     cetirizine  (ZYRTEC ) 10 MG tablet TAKE 1 TABLET BY MOUTH EVERY DAY 90 tablet 3   Cholecalciferol (VITAMIN D3 PO) Take 1 tablet by mouth daily.     clindamycin (CLEOCIN T) 1 % lotion Apply topically 2 (two) times daily.     clindamycin (CLEOCIN T) 1 % SWAB      clotrimazole -betamethasone  (LOTRISONE ) cream Apply topically 2 (two) times daily. 45 g 3   diclofenac  sodium (VOLTAREN ) 1 % GEL Apply 2 g topically 4 (four) times daily. To affected areas 400 g PRN   fluticasone  (FLONASE ) 50 MCG/ACT nasal spray Place 2 sprays into both nostrils daily. 16 g 2   glucose blood (ONETOUCH ULTRA) test strip CHECK FASTING BLOOD SUGAR 2-3 TIMES WEEKLY. 50 strip 25   hydrochlorothiazide  (HYDRODIURIL ) 25 MG tablet TAKE 1 TABLET (25 MG TOTAL) BY MOUTH DAILY. 90 tablet 1   hydrocortisone (ANUSOL-HC) 25 MG suppository Place 25 mg rectally as needed. Reported on 04/19/2016     hydrocortisone 2.5 % cream Apply topically as needed. Reported on 04/19/2016     hydrOXYzine (ATARAX) 10 MG tablet Take 10 mg by mouth every 8 (eight) hours as needed.     ipratropium (ATROVENT ) 0.06 % nasal spray Place 2 sprays into both nostrils every 4 (four) hours as needed for rhinitis. 10 mL 6   Lancets 30G MISC DX DM E11.9 Check fasting blood sugar 2-3 times weekly. 100 each prn   lidocaine (LIDODERM) 5 % Place 1 patch onto the skin daily. Remove & Discard patch within 12 hours or as directed by MD     losartan  (COZAAR ) 100 MG tablet TAKE 1  TABLET BY MOUTH EVERY DAY 90 tablet 1   lubiprostone  (AMITIZA ) 8 MCG capsule TAKE 1 CAPSULE (8 MCG TOTAL) BY MOUTH 2 (TWO) TIMES DAILY WITH A MEAL. 180 capsule 1   montelukast  (SINGULAIR ) 10 MG tablet TAKE 1 TABLET BY MOUTH AT BEDTIME. 30 tablet 5   naproxen (NAPROSYN) 500 MG tablet Take 500 mg by mouth 2 (two) times daily as needed.     pantoprazole  (PROTONIX ) 40 MG tablet Take 40 mg by mouth 2 (two) times daily.     polycarbophil (FIBERCON) 625 MG tablet Take 625 mg by mouth daily.     propranolol  ER (INDERAL  LA) 80 MG 24 hr capsule TAKE 1 CAPSULE BY MOUTH EVERY DAY 90 capsule 0   Propylene Glycol (SYSTANE BALANCE) 0.6 % SOLN Apply 1 drop to eye daily.     rosuvastatin  (CRESTOR ) 20 MG tablet Take 1 tablet (20 mg total) by mouth daily. [phone] 90 tablet 3   sertraline (ZOLOFT) 100 MG tablet Take 2 tablets by mouth daily. = 200mg  total daily dose, managed by psychiatry at [facility]  tadalafil (CIALIS) 5 MG tablet Take 5 mg by mouth daily.  11   tamsulosin (FLOMAX) 0.4 MG CAPS capsule Take 0.4 mg by mouth daily.     tiotropium (SPIRIVA) 18 MCG inhalation capsule Place 18 mcg into inhaler and inhale daily.     topiramate  (TOPAMAX ) 50 MG tablet Take 1 tablet (50 mg total) by mouth 2 (two) times daily. Keep anointment on 06/27/2022 180 tablet 3   traMADol  (ULTRAM ) 50 MG tablet Take 50 mg by mouth every 12 (twelve) hours as needed for moderate pain.     TRAZODONE HCL PO Take 100 mg by mouth in the morning and at bedtime. Man[facility] by [facility]ry at [facility]     valACYclovir  (VALTREX ) 500 MG tablet TAKE 1 TABLET BY MOUTH EVERY DAY 90 tablet 1   No current facility-administered medications on file prior to visit.    ALLERGIES: Allergies  Allergen Reactions   Amlodipine  Rash   Atorvastatin  Other (See Comments)    Myalgias    FAMILY HISTORY: Family History  Problem Relation Age of Onset   Breast cancer Sister    Depression Sister    Hyperlipidemia Other    Ovarian cancer Sister    Hypertension  Other    Heart disease Brother       Objective:  *** General: No acute distress.  Patient appears well-groomed.   ***   Janne Members, DO  CC: Duaine German, MD

## 2024-04-13 ENCOUNTER — Ambulatory Visit (INDEPENDENT_AMBULATORY_CARE_PROVIDER_SITE_OTHER): Payer: Medicare Other | Admitting: Neurology

## 2024-04-13 ENCOUNTER — Encounter: Payer: Self-pay | Admitting: Neurology

## 2024-04-13 VITALS — BP 121/72 | HR 85 | Ht 67.0 in | Wt 191.0 lb

## 2024-04-13 DIAGNOSIS — G43009 Migraine without aura, not intractable, without status migrainosus: Secondary | ICD-10-CM | POA: Diagnosis not present

## 2024-04-17 ENCOUNTER — Ambulatory Visit: Payer: Medicare Other | Admitting: Neurology

## 2024-07-10 ENCOUNTER — Other Ambulatory Visit: Payer: Self-pay | Admitting: Neurology

## 2024-07-10 ENCOUNTER — Ambulatory Visit: Admitting: Physician Assistant

## 2024-07-11 ENCOUNTER — Other Ambulatory Visit: Payer: Self-pay | Admitting: Family Medicine

## 2024-07-15 ENCOUNTER — Ambulatory Visit (INDEPENDENT_AMBULATORY_CARE_PROVIDER_SITE_OTHER): Admitting: Family Medicine

## 2024-07-15 ENCOUNTER — Encounter: Payer: Self-pay | Admitting: Family Medicine

## 2024-07-15 VITALS — BP 109/62 | HR 84 | Ht 67.0 in | Wt 187.1 lb

## 2024-07-15 DIAGNOSIS — J40 Bronchitis, not specified as acute or chronic: Secondary | ICD-10-CM | POA: Diagnosis not present

## 2024-07-15 DIAGNOSIS — J329 Chronic sinusitis, unspecified: Secondary | ICD-10-CM

## 2024-07-15 NOTE — Progress Notes (Signed)
 Acute Office Visit  Subjective:     Patient ID: Brandon [name], male    DOB: February 11, 1951, 73 y.o.   MRN: [id number]  Chief Complaint  Patient presents with   Bronchitis    HPI Patient is in today for   Discussed the use of AI scribe software for clinical note transcription with the patient, who gave verbal consent to proceed.  History of Present Illness Brandon [name] is a 73 year old male who presents with persistent cough and congestion.  Cough and sputum production - Persistent cough since the last week of August - Initial sputum was greenish-yellow, now more cloudy - Significant post-nasal drainage, described as 'water running constantly' - Hoarseness present - Continued mucus production, - Partial improvement  Nasal congestion and upper respiratory symptoms - Ongoing nasal congestion - Use of saline spray and Flonase  for symptom relief - Initial ear clogging, improved after starting antibiotics - Use of humidifier and topical menthol (Vicks) with persistent symptoms  Recent treatments - Completed one courses of antibiotics and 2 course of prednisone  - Uses saline spray and Flonase  for nasal congestion - Manages mucus with saline rinses and gargles  Dietary modifications and supplements - Currently taking B12 supplements - Diet focused on vegetables and fish - Avoids fried foods    ROS      Objective:    BP 109/62   Pulse 84   Ht 5' 7 (1.702 m)   Wt 187 lb 1.3 oz (84.9 kg)   SpO2 99%   BMI 29.30 kg/m    Physical Exam Vitals and nursing note reviewed.  Constitutional:      Appearance: Normal appearance.  HENT:     Head: Normocephalic and atraumatic.     Right Ear: Tympanic membrane, ear canal and external ear normal. There is no impacted cerumen.     Left Ear: Tympanic membrane, ear canal and external ear normal. There is no impacted cerumen.     Nose: Nose normal.     Mouth/Throat:     Pharynx: Oropharynx is clear.  Eyes:      Conjunctiva/sclera: Conjunctivae normal.  Cardiovascular:     Rate and Rhythm: Normal rate and regular rhythm.  Pulmonary:     Effort: Pulmonary effort is normal.     Breath sounds: Normal breath sounds.  Musculoskeletal:     Cervical back: Neck supple. No tenderness.  Lymphadenopathy:     Cervical: No cervical adenopathy.  Skin:    General: Skin is warm and dry.  Neurological:     Mental Status: He is alert and oriented to person, place, and time.  Psychiatric:        Mood and Affect: Mood normal.     No results found for any visits on 07/15/24.      Assessment & Plan:   Problem List Items Addressed This Visit   None Visit Diagnoses       Sinobronchitis    -  Primary      Assessment and Plan Assessment & Plan Acute bronchitis with persistent cough and sinus congestion Symptoms improving but persistent due to residual inflammation and mucus production. - Use saline nasal rinse with boiled and cooled water. - Continue Flonase  post-nasal rinse. - Gargle if throat mucus persists. - Send MyChart message if symptoms worsen before travel. - Increase vegetables and fish intake, reduce fried foods.   No orders of the defined types were placed in this encounter.   No follow-ups on file.  Dorothyann Byars,  MD

## 2024-07-15 NOTE — Progress Notes (Signed)
 Pt stated that he has been seen at the [facility] 2x and has been treated for bronchitis.  The 1st time 9/2 they gave him Albuterol  inhaler, Amoxicillin , and Methylprednisolone dose pack.  The 2nd time 9/13 he was given Prednisone  and guaifenesin 

## 2024-08-18 ENCOUNTER — Other Ambulatory Visit: Payer: Self-pay | Admitting: Family Medicine

## 2024-08-18 DIAGNOSIS — I10 Essential (primary) hypertension: Secondary | ICD-10-CM

## 2024-08-24 ENCOUNTER — Telehealth: Payer: Self-pay

## 2024-08-24 NOTE — Telephone Encounter (Signed)
 Copied from CRM [id number]. Topic: Clinical - Medical Advice >> Aug 24, 2024  2:46 PM Delon HERO wrote: Reason for CRM: Patient is calling to ask when was his last flu shot. Please advise

## 2024-08-24 NOTE — Telephone Encounter (Signed)
 Left message advising of last flu vaccine.

## 2024-08-26 ENCOUNTER — Other Ambulatory Visit: Payer: Self-pay | Admitting: Family Medicine

## 2024-08-26 ENCOUNTER — Telehealth: Payer: Self-pay

## 2024-08-26 ENCOUNTER — Ambulatory Visit (INDEPENDENT_AMBULATORY_CARE_PROVIDER_SITE_OTHER): Admitting: Family Medicine

## 2024-08-26 ENCOUNTER — Encounter: Payer: Self-pay | Admitting: Family Medicine

## 2024-08-26 VITALS — BP 124/73 | HR 60 | Ht 67.0 in | Wt 194.0 lb

## 2024-08-26 DIAGNOSIS — A6000 Herpesviral infection of urogenital system, unspecified: Secondary | ICD-10-CM

## 2024-08-26 DIAGNOSIS — J302 Other seasonal allergic rhinitis: Secondary | ICD-10-CM | POA: Diagnosis not present

## 2024-08-26 DIAGNOSIS — E785 Hyperlipidemia, unspecified: Secondary | ICD-10-CM

## 2024-08-26 DIAGNOSIS — Z23 Encounter for immunization: Secondary | ICD-10-CM

## 2024-08-26 DIAGNOSIS — D562 Delta-beta thalassemia: Secondary | ICD-10-CM

## 2024-08-26 DIAGNOSIS — I10 Essential (primary) hypertension: Secondary | ICD-10-CM

## 2024-08-26 DIAGNOSIS — F331 Major depressive disorder, recurrent, moderate: Secondary | ICD-10-CM | POA: Diagnosis not present

## 2024-08-26 MED ORDER — CETIRIZINE HCL 10 MG PO TABS: 10.0000 mg | ORAL_TABLET | Freq: Every day | ORAL | 3 refills | Status: AC

## 2024-08-26 NOTE — Assessment & Plan Note (Signed)
-  Monitor CBC regularly

## 2024-08-26 NOTE — Telephone Encounter (Signed)
 Can we request from [place].  Thank you

## 2024-08-26 NOTE — Telephone Encounter (Signed)
 Copied from CRM [id number]. Topic: General - Other >> Aug 26, 2024  2:25 PM Delon DASEN wrote: Reason for CRM: last colonoscopy was 10/01/2020 at the [facility]- message for Dr Alvan

## 2024-08-26 NOTE — Assessment & Plan Note (Signed)
 On zoloft per the [place]. No recent changes.

## 2024-08-26 NOTE — Assessment & Plan Note (Signed)
 Well controlled. Continue current regimen. Follow up in  6 mo

## 2024-08-26 NOTE — Assessment & Plan Note (Addendum)
 Hyperlipidemia On 20 mg cholesterol medication, reduced from 40 mg. Desires further reduction. Implementing dietary changes to manage cholesterol. - Ordered baseline cholesterol test. - Continue cholesterol medication at 20 mg. - Encouraged dietary modifications. - his goal is to be able to decresae his cholesterol med by making dietary changes.  - plan to recheck in 7m onths.

## 2024-08-26 NOTE — Telephone Encounter (Signed)
 Not in Care Everywhere.

## 2024-08-26 NOTE — Progress Notes (Signed)
 Established Patient Office Visit  Patient ID: Brandon [name], male    DOB: 1951/03/10  Age: 73 y.o. MRN: [id number] PCP: Alvan Dorothyann BIRCH, MD  Chief Complaint  Patient presents with   Hypertension    Subjective:     HPI  Discussed the use of AI scribe software for clinical note transcription with the patient, who gave verbal consent to proceed.  History of Present Illness Brandon [name] is a 74 year old male who presents for a follow-up visit to discuss his current supplement use and dietary changes.  Dietary modifications and nutritional supplementation - Currently taking a B12 supplement. - Plans to restart iron supplementation with two teaspoons of liquid iron. - Started a probiotic containing lactobacillus, gluten and dairy free. - Considering discontinuing CMOS supplement, which he believes may affect thyroid , lung, and inflammation. - Focusing on increasing intake of vegetables, fruits, nuts, and oatmeal to improve cholesterol levels. - Using olive oil instead of other oils, and prioritizing fish and chicken over fried foods. - Taking cinnamon in capsule form for blood sugar management, filling two capsules with cinnamon powder from a can.  Dyspepsia and gastrointestinal symptoms - Experiences phlegm and occasional vomiting after meals, attributed to certain foods. - Initiated probiotic use to address gastrointestinal symptoms. - Monitoring dietary intake to identify triggers for symptoms. - Yogurt appears to alleviate gastrointestinal symptoms.  Hyperlipidemia management - Currently taking 20 mg of cholesterol medication daily, splitting [facility] - Desires to reduce cholesterol medication dosage to 10 mg and eventually discontinue if possible.     ROS    Objective:     BP 124/73   Pulse 60   Ht 5' 7 (1.702 m)   Wt 194 lb (88 kg)   SpO2 96%   BMI 30.38 kg/m    Physical Exam Vitals and nursing note reviewed.   Constitutional:      Appearance: Normal appearance.  HENT:     Head: Normocephalic and atraumatic.  Eyes:     Conjunctiva/sclera: Conjunctivae normal.  Cardiovascular:     Rate and Rhythm: Normal rate and regular rhythm.  Pulmonary:     Effort: Pulmonary effort is normal.     Breath sounds: Normal breath sounds.  Skin:    General: Skin is warm and dry.  Neurological:     Mental Status: He is alert.  Psychiatric:        Mood and Affect: Mood normal.      No results found for any visits on 08/26/24.    The ASCVD Risk score (Arnett DK, et al., 2019) failed to calculate for the following reasons:   The valid total cholesterol range is 130 to 320 mg/dL    Assessment & Plan:   Problem List Items Addressed This Visit       Cardiovascular and Mediastinum   Essential hypertension, benign - Primary   Well controlled. Continue current regimen. Follow up in  76mo       Relevant Orders   Lipid Panel With LDL/HDL Ratio   Lipid panel   RFE85+ZHQM     Other   Seasonal allergies   Relevant Medications   cetirizine  (ZYRTEC ) 10 MG tablet   Recurrent major depressive episodes, moderate (HCC)   On zoloft per the [place]. No recent changes.        Hyperlipidemia   Hyperlipidemia On 20 mg cholesterol medication, reduced from 40 mg. Desires further reduction. Implementing dietary changes to manage cholesterol. - Ordered  baseline cholesterol test. - Continue cholesterol medication at 20 mg. - Encouraged dietary modifications. - his goal is to be able to decresae his cholesterol med by making dietary changes.  - plan to recheck in 27m onths.       Relevant Orders   Lipid Panel With LDL/HDL Ratio   Lipid panel   RFE85+ZHQM   Delta beta thalassemia (HCC)   Monitor CBC regularly      Other Visit Diagnoses       Encounter for immunization       Relevant Orders   Flu vaccine HIGH DOSE PF(Fluzone Trivalent) (Completed)       Assessment and Plan Assessment &  Plan   General Health Maintenance Discussed dietary changes, supplements, and potential food triggers. Reviewed benefits of Mediterranean diet. - Continue B12 and iron supplements. - Continue probiotic supplementation. - Encouraged dietary changes including increased intake of vegetables, fruits, nuts, and olive oil. - Monitor for potential food triggers related to phlegm production. - Provided flu shot administration printout.    Return in about 6 months (around 02/23/2025) for Hypertension.    Dorothyann Byars, MD [facility] at [facility]

## 2024-08-27 LAB — LIPID PANEL
Chol/HDL Ratio: 4.3 ratio (ref 0.0–5.0)
Cholesterol, Total: 124 mg/dL (ref 100–199)
HDL: 29 mg/dL — ABNORMAL LOW (ref >39)
LDL Chol Calc (NIH): 60 mg/dL (ref 0–99)
Triglycerides: 210 mg/dL — ABNORMAL HIGH (ref 0–149)
VLDL Cholesterol Cal: 35 mg/dL (ref 5–40)

## 2024-08-27 LAB — CMP14+EGFR
ALT: 23 IU/L (ref 0–44)
AST: 22 IU/L (ref 0–40)
Albumin: 4.5 g/dL (ref 3.8–4.8)
Alkaline Phosphatase: 97 IU/L (ref 47–123)
BUN/Creatinine Ratio: 8 — ABNORMAL LOW (ref 10–24)
BUN: 10 mg/dL (ref 8–27)
Bilirubin Total: 0.5 mg/dL (ref 0.0–1.2)
CO2: 23 mmol/L (ref 20–29)
Calcium: 9.4 mg/dL (ref 8.6–10.2)
Chloride: 100 mmol/L (ref 96–106)
Creatinine, Ser: 1.23 mg/dL (ref 0.76–1.27)
Globulin, Total: 2.5 g/dL (ref 1.5–4.5)
Glucose: 132 mg/dL — ABNORMAL HIGH (ref 70–99)
Potassium: 3.7 mmol/L (ref 3.5–5.2)
Sodium: 139 mmol/L (ref 134–144)
Total Protein: 7 g/dL (ref 6.0–8.5)
eGFR: 62 mL/min/1.73 (ref >59)

## 2024-08-27 NOTE — Telephone Encounter (Signed)
 Requested fax number for colonoscopy results. Request sent to [phone]

## 2024-09-01 ENCOUNTER — Ambulatory Visit: Payer: Self-pay | Admitting: Family Medicine

## 2024-09-01 NOTE — Progress Notes (Signed)
 Hi Juvenal, your triglycerides are still up a little bit similar to in the past.  LDL looks good though.  Kidney function is stable at 1.2 and liver enzymes are normal.

## 2024-09-24 DIAGNOSIS — N138 Other obstructive and reflux uropathy: Secondary | ICD-10-CM | POA: Diagnosis not present

## 2024-09-24 DIAGNOSIS — N401 Enlarged prostate with lower urinary tract symptoms: Secondary | ICD-10-CM | POA: Diagnosis not present

## 2024-09-24 DIAGNOSIS — N5201 Erectile dysfunction due to arterial insufficiency: Secondary | ICD-10-CM | POA: Diagnosis not present

## 2024-09-24 DIAGNOSIS — R399 Unspecified symptoms and signs involving the genitourinary system: Secondary | ICD-10-CM | POA: Diagnosis not present

## 2024-10-10 ENCOUNTER — Other Ambulatory Visit: Payer: Self-pay | Admitting: Neurology

## 2024-10-10 DIAGNOSIS — G43701 Chronic migraine without aura, not intractable, with status migrainosus: Secondary | ICD-10-CM

## 2024-10-12 ENCOUNTER — Other Ambulatory Visit: Payer: Self-pay

## 2024-10-12 DIAGNOSIS — G43701 Chronic migraine without aura, not intractable, with status migrainosus: Secondary | ICD-10-CM

## 2024-10-12 MED ORDER — TOPIRAMATE 50 MG PO TABS: 50.0000 mg | ORAL_TABLET | Freq: Two times a day (BID) | ORAL | 0 refills | Status: AC

## 2024-10-12 MED ORDER — PROPRANOLOL HCL ER 80 MG PO CP24: 80.0000 mg | ORAL_CAPSULE | Freq: Every day | ORAL | 0 refills | Status: AC

## 2024-11-12 ENCOUNTER — Ambulatory Visit: Payer: Self-pay | Admitting: *Deleted

## 2024-11-12 NOTE — Telephone Encounter (Signed)
 FYI Only or Action Required?: FYI only for provider: appointment scheduled on 11/18/24.  Patient was last seen in primary care on 08/26/2024 by Brandon Dorothyann BIRCH, MD.  Called Nurse Triage reporting Back Pain.  Symptoms began several weeks ago.  Interventions attempted: Rest, hydration, or home remedies and Other: therapy.  Symptoms are: gradually worsening.  Triage Disposition: See Physician Within 24 Hours  Patient/caregiver understands and will follow disposition?: Yes   Offered appt today and patient unable to make it. Requesting any other recommendations for pain control until appt 11/18/24. Please advise.               Reason for Disposition  [1] Numbness in an arm or hand (i.e., loss of sensation) AND [2] upper back pain  Answer Assessment - Initial Assessment Questions Offered appt today and patient reports he was unable to make appt. No other available appt until next week. Patient requested appt. Scheduled with other provider 11/18/24. Recommended if sx worsen go to UC or ED. Please advise any other recommendations for sx prior to OV      1. ONSET: When did the pain begin? (e.g., minutes, hours, days)     Couple of weeks  2. LOCATION: Where does it hurt? (upper, mid or lower back)    Low back and up in neck  3. SEVERITY: How bad is the pain?  (e.g., Scale 1-10; mild, moderate, or severe)     Pain with walking did exercises and now pain - moderate  4. PATTERN: Is the pain constant? (e.g., yes, no; constant, intermittent)      Comes and goes  5. RADIATION: Does the pain shoot into your legs or somewhere else?     Does report N/T in hands and feet and legs at times. 6. CAUSE:  What do you think is causing the back pain?      Not sure, has been seen for back pain and done therapy 7. BACK OVERUSE:  Any recent lifting of heavy objects, strenuous work or exercise?     Does not believe so  8. MEDICINES: What have you taken so far for the pain?  (e.g., nothing, acetaminophen , NSAIDS)     Na  9. NEUROLOGIC SYMPTOMS: Do you have any weakness, numbness, or problems with bowel/bladder control?     N/T bilateral hands , feet and legs at times 10. OTHER SYMPTOMS: Do you have any other symptoms? (e.g., fever, abdomen pain, burning with urination, blood in urine)       Low back pain, pain in neck with N/T bilateral hands at times. Hx N/T legs and feet. No severe pain can walk no other issues reported. 11. PREGNANCY: Is there any chance you are pregnant? When was your last menstrual period?       na  Protocols used: Back Pain-A-AH

## 2024-11-13 NOTE — Telephone Encounter (Signed)
 Attempted call to patient. Left a voice mail message requesting a return call.

## 2024-11-13 NOTE — Telephone Encounter (Signed)
 Please triage phone call since we cannot get him in acutely for his back pain.  He does have a muscle relaxer on file which we have not filled in a while so that might be a possibility for us  to fill but again we need to see what is going on

## 2024-11-16 MED ORDER — BACLOFEN 10 MG PO TABS: 10.0000 mg | ORAL_TABLET | Freq: Three times a day (TID) | ORAL | 2 refills | Status: DC | PRN

## 2024-11-16 NOTE — Telephone Encounter (Signed)
 Meds ordered this encounter  Medications   baclofen  (LIORESAL ) 10 MG tablet    Sig: Take 1 tablet (10 mg total) by mouth 3 (three) times daily as needed for muscle spasms.    Dispense:  30 each    Refill:  2

## 2024-11-16 NOTE — Telephone Encounter (Signed)
 Attempted to contact the patient. No answer. Left a brief vm msg for the patient regarding the Baclofen  rx was sent to the pharmacy. Direct call back information provided.

## 2024-11-17 ENCOUNTER — Other Ambulatory Visit: Payer: Self-pay | Admitting: *Deleted

## 2024-11-17 MED ORDER — BACLOFEN 10 MG PO TABS: 10.0000 mg | ORAL_TABLET | Freq: Three times a day (TID) | ORAL | 2 refills | Status: AC | PRN

## 2024-11-17 NOTE — Telephone Encounter (Signed)
 Spoke with patient and will pick up medication this morning.

## 2024-11-17 NOTE — Telephone Encounter (Signed)
 Patient will also keep his upcoming appt scheduled with Benton Gave tomorrow.

## 2024-11-18 ENCOUNTER — Ambulatory Visit

## 2024-11-18 ENCOUNTER — Ambulatory Visit: Payer: Self-pay | Admitting: Urgent Care

## 2024-11-18 ENCOUNTER — Ambulatory Visit: Admitting: Urgent Care

## 2024-11-18 ENCOUNTER — Encounter: Payer: Self-pay | Admitting: Urgent Care

## 2024-11-18 VITALS — BP 124/68 | HR 84 | Ht 67.0 in | Wt 196.0 lb

## 2024-11-18 DIAGNOSIS — M4716 Other spondylosis with myelopathy, lumbar region: Secondary | ICD-10-CM | POA: Diagnosis not present

## 2024-11-18 DIAGNOSIS — M25512 Pain in left shoulder: Secondary | ICD-10-CM | POA: Diagnosis not present

## 2024-11-18 DIAGNOSIS — M792 Neuralgia and neuritis, unspecified: Secondary | ICD-10-CM | POA: Diagnosis not present

## 2024-11-18 DIAGNOSIS — M545 Low back pain, unspecified: Secondary | ICD-10-CM

## 2024-11-18 DIAGNOSIS — M542 Cervicalgia: Secondary | ICD-10-CM | POA: Diagnosis not present

## 2024-11-18 DIAGNOSIS — G8929 Other chronic pain: Secondary | ICD-10-CM

## 2024-11-18 DIAGNOSIS — M47812 Spondylosis without myelopathy or radiculopathy, cervical region: Secondary | ICD-10-CM

## 2024-11-18 DIAGNOSIS — M48062 Spinal stenosis, lumbar region with neurogenic claudication: Secondary | ICD-10-CM

## 2024-11-18 DIAGNOSIS — M7918 Myalgia, other site: Secondary | ICD-10-CM

## 2024-11-18 MED ORDER — LIDOCAINE 5 % EX PTCH: 1.0000 | MEDICATED_PATCH | CUTANEOUS | 3 refills | Status: AC

## 2024-11-18 MED ORDER — PREDNISONE 20 MG PO TABS: 20.0000 mg | ORAL_TABLET | Freq: Every day | ORAL | 0 refills | Status: AC

## 2024-11-18 NOTE — Patient Instructions (Signed)
 You have cervical trigger point, which is knots in the muscles of the neck. Please apply a warm moist compress, such as a microwavable heating pack, to your neck several times daily. After each warm compress, apply the technique that we discussed today of ischemic release. This is a prolonged, deep pressure into the knot of the muscle to release the tension. Use your elbow.  Take the muscle relaxer three times daily as needed. Keep in mind it may make you feel tired or drowsy, so do not operate machinery or drive a car until you know how it affects you.  If your symptoms persist, you would be a candidate for trigger point injection or dry needling.  Try to stay hydrated with WATER as dehydration and caffeine intake can worsen this condition.   Please pick up lidoderm  patch (OTC goes by the name Salonpas 4%). Apply in the morning to the L side of the lower back, and remove 12 hours later.   Please go to [address] for imaging of your neck/ spine.  If you develop any numbness of the groin or change in urination, head to the ER.

## 2024-11-18 NOTE — Progress Notes (Signed)
 "  Established Patient Office Visit  Subjective:  Patient ID: Brandon [name], male    DOB: 02/06/1951  Age: 74 y.o. MRN: [id number]  Chief Complaint  Patient presents with   Back Pain    Back pain that radiates to the left groin and down leg   Neck Pain    Has to turn whole body to turn head; sometimes has numb/tingle in hands    Back Pain  Neck Pain     Discussed the use of AI scribe software for clinical note transcription with the patient, who gave verbal consent to proceed.  History of Present Illness   Brandon [name] is a 74 year old male with chronic back pain who presents with worsening pain radiating to the left leg and groin.  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 Over the past month, the pain has intensified, radiating from his back to his left leg and groin. The pain is severe, affecting his ability to walk and causing instability, though he has not fallen. He experiences pain in the inner thigh and groin area, primarily on the left side.  He has a history of neck pain, which he attributes to muscle stiffness. He recently started taking baclofen , a muscle relaxer, and has taken one tablet since yesterday. He also uses tramadol  daily for chronic back pain, which he receives from the [facility]. He has tried Tylenol , heat, and ice packs for pain relief.  He reports occasional urinary frequency but denies incontinence or bowel issues, though he experiences constipation, which he attributes to his medications. He manages this with increased water intake and fiber supplements. This is not an acute issue stemming from his back.   The pain disrupts his sleep, particularly when he wakes up at night to use the bathroom. He has a history of physical therapy but has not engaged in it recently. He is currently in a transitional phase with his healthcare provider at the [facility], as his long-term doctor recently retired.   Currently, pt denies bowel/  bladder incontinence or saddle anesthesia. No fevers. Has tried gabapentin in the past with ill side effects.      Patient Active Problem List   Diagnosis Date Noted   Recurrent major depressive episodes, moderate (HCC) 08/26/2024   Seasonal allergies 02/18/2023   Constipation 02/18/2023   Spinal stenosis of lumbar region 09/07/2022   Tingling in extremities 10/04/2020   Tinnitus of both ears 10/04/2020   Deviated septum 10/04/2020   Low ferritin 08/04/2019   Delta beta thalassemia (HCC) 04/16/2019   Asthma 03/10/2018   Primary osteoarthritis of right hand 01/30/2018   Migraine with aura and with status migrainosus, not intractable 11/21/2017   Recurrent genital herpes simplex 11/21/2017   Eosinophilic esophagitis 09/03/2017   Lumbar spondylosis with myelopathy 06/14/2017   OSA on CPAP 05/16/2017   Hearing loss 01/31/2017   Pulmonary nodule 01/31/2017   Kidney lesion, native, left 01/31/2017   Chronic cough 04/19/2016   Insomnia 11/18/2014   Gout 12/03/2013   Onychomycosis 12/03/2013   B12 deficiency 12/03/2013   Vitamin D deficiency 12/03/2013   Liver fibrosis 12/03/2013   Essential hypertension, benign 10/29/2013   History of hepatitis C virus infection 10/29/2013   GERD (gastroesophageal reflux disease) 10/29/2013   Hyperlipidemia 10/29/2013   BPH (benign prostatic hyperplasia) 10/29/2013   ED (erectile dysfunction) 10/29/2013   Hemorrhoid 10/29/2013   HSV antigen DIF positive 11/27/2011   Sciatic neuropathy 08/10/2011   Pain of  lumbosacral spine 08/10/2011   Hepatitis C antibody test positive 08/10/2011   Chronic prostatitis without hematuria 08/10/2011   Past Medical History:  Diagnosis Date   Arthritis    ED (erectile dysfunction)    GERD (gastroesophageal reflux disease)    Hepatitis C    [phone], treated at [facility]   Hyperlipidemia    Hypertension    Nose fracture    Past Surgical History:  Procedure Laterality Date   FOOT SURGERY Right    1980    FOOT SURGERY Left    1980   HERNIA REPAIR  2015   INGUINAL HERNIA REPAIR Right    2009   Social History[1]    ROS: as noted in HPI  Objective:     BP 124/68   Pulse 84   Ht 5' 7 (1.702 m)   Wt 196 lb (88.9 kg)   SpO2 98%   BMI 30.70 kg/m  BP Readings from Last 3 Encounters:  11/18/24 124/68  08/26/24 124/73  07/15/24 109/62   Wt Readings from Last 3 Encounters:  11/18/24 196 lb (88.9 kg)  08/26/24 194 lb (88 kg)  07/15/24 187 lb 1.3 oz (84.9 kg)      Physical Exam Vitals and nursing note reviewed. Exam conducted with a chaperone present.  Constitutional:      General: He is not in acute distress.    Appearance: Normal appearance. He is not ill-appearing, toxic-appearing or diaphoretic.  HENT:     Head: Normocephalic and atraumatic.     Right Ear: External ear normal.     Left Ear: External ear normal.     Nose: Nose normal.  Eyes:     General: No scleral icterus.    Pupils: Pupils are equal, round, and reactive to light.  Neck:     Meningeal: Brudzinski's sign absent.  Cardiovascular:     Rate and Rhythm: Normal rate.  Pulmonary:     Effort: Pulmonary effort is normal. No respiratory distress.  Musculoskeletal:     Cervical back: No edema, erythema, rigidity, tenderness or crepitus. Pain with movement, spinous process tenderness (around C6/7) and muscular tenderness (large tender knot to L trap) present. Decreased range of motion (significantly decreased ROM to neck extension, left lateral rotation and flexion).     Thoracic back: No tenderness or bony tenderness. Normal range of motion.     Lumbar back: Tenderness (to L lateral back/ musculature) and bony tenderness (to SI joint on L) present. Decreased range of motion. Negative right straight leg raise test and negative left straight leg raise test.     Right lower leg: No edema.     Left lower leg: No edema.     Comments: Normal gait. Pt able to stand on tip toes and heels Normal patellar reflexes but  unable to illicit achilles reflex bilaterally  Lymphadenopathy:     Cervical: No cervical adenopathy.     Right cervical: No superficial or deep cervical adenopathy.    Left cervical: No superficial or deep cervical adenopathy.  Skin:    General: Skin is warm and dry.     Findings: No erythema or rash.  Neurological:     General: No focal deficit present.     Mental Status: He is alert and oriented to person, place, and time.      No results found for any visits on 11/18/24.  Last CBC Lab Results  Component Value Date   WBC 6.2 08/23/2023   HGB 15.4 08/23/2023   HCT  47.1 08/23/2023   MCV 80 08/23/2023   MCH 26.0 (L) 08/23/2023   RDW 15.6 (H) 08/23/2023   PLT 144 (L) 08/23/2023   Last metabolic panel Lab Results  Component Value Date   GLUCOSE 132 (H) 08/26/2024   NA 139 08/26/2024   K 3.7 08/26/2024   CL 100 08/26/2024   CO2 23 08/26/2024   BUN 10 08/26/2024   CREATININE 1.23 08/26/2024   EGFR 62 08/26/2024   CALCIUM  9.4 08/26/2024   PROT 7.0 08/26/2024   ALBUMIN 4.5 08/26/2024   LABGLOB 2.5 08/26/2024   AGRATIO 1.6 02/20/2018   BILITOT 0.5 08/26/2024   ALKPHOS 97 08/26/2024   AST 22 08/26/2024   ALT 23 08/26/2024   ANIONGAP 8 04/16/2019   Last lipids Lab Results  Component Value Date   CHOL 124 08/26/2024   HDL 29 (L) 08/26/2024   LDLCALC 60 08/26/2024   TRIG 210 (H) 08/26/2024   CHOLHDL 4.3 08/26/2024   Last hemoglobin A1c Lab Results  Component Value Date   HGBA1C 5.4 09/24/2022   Last thyroid  functions Lab Results  Component Value Date   TSH 1.390 08/23/2023   Last vitamin D Lab Results  Component Value Date   VD25OH 25.2 02/28/2012   Last vitamin B12 and Folate Lab Results  Component Value Date   VITAMINB12 291 01/27/2024   FOLATE 13.6 03/19/2019      The ASCVD Risk score (Arnett DK, et al., 2019) failed to calculate for the following reasons:   The valid total cholesterol range is 130 to 320 mg/dL  Assessment & Plan:   Cervical spine arthritis with nerve pain -     DG Cervical Spine Complete; Future -     Ambulatory referral to Physical Therapy  Lumbar spondylosis with myelopathy -     DG Lumbar Spine Complete; Future -     Lidocaine ; Place 1 patch onto the skin daily. Remove & Discard patch within 12 hours or as directed by MD  Dispense: 30 patch; Refill: 3 -     Ambulatory referral to Physical Therapy  Myofascial pain syndrome of neck -     Ambulatory referral to Physical Therapy  Assessment and Plan    Lumbar radiculopathy Chronic lumbar radiculopathy with recent exacerbation. Pain radiates to left groin and leg with numbness and tingling. Possible nerve impingement or herniation. Previous imaging showed severe stenosis. Current symptoms suggest nerve impingement, further imaging warranted. - Ordered lumbar spine x-ray with flexion and extension views. - Advised use of heat pack on tight areas. - Recommended Lidoderm  patch for neuropathic pain, apply for 12 hours on, 12 hours off. - Continue baclofen  as prescribed. - Monitor for warning signs such as loss of sensation in the groin or changes in bowel/bladder function which warrants ER.  Cervical spondylosis with radicular symptoms Chronic cervical spondylosis with recent exacerbation. Pain in neck with decreased range of motion, especially on left. History of tingling in hands. Examination shows decreased neck extension and rotation on left. - Recommended Lidoderm  patch for neuropathic pain. - Continue baclofen  as prescribed.  - moist heat - PT        No follow-ups on file.   Benton LITTIE Gave, PA     [1]  Social History Tobacco Use   Smoking status: Former    Current packs/day: 0.00    Average packs/day: 1.5 packs/day for 15.0 years (22.5 ttl pk-yrs)    Types: Cigarettes    Start date: 10/23/1971    Quit date: 10/22/1986  Years since quitting: 38.1   Smokeless tobacco: Never   Tobacco comments:    SmokE off and on  Vaping Use    Vaping status: Never Used  Substance Use Topics   Alcohol use: No   Drug use: No   "

## 2024-11-19 ENCOUNTER — Ambulatory Visit: Attending: Urgent Care | Admitting: Physical Therapy

## 2024-11-19 ENCOUNTER — Encounter: Payer: Self-pay | Admitting: Physical Therapy

## 2024-11-19 ENCOUNTER — Other Ambulatory Visit: Payer: Self-pay

## 2024-11-19 DIAGNOSIS — M542 Cervicalgia: Secondary | ICD-10-CM | POA: Diagnosis not present

## 2024-11-19 DIAGNOSIS — M6281 Muscle weakness (generalized): Secondary | ICD-10-CM | POA: Insufficient documentation

## 2024-11-19 DIAGNOSIS — M792 Neuralgia and neuritis, unspecified: Secondary | ICD-10-CM | POA: Insufficient documentation

## 2024-11-19 DIAGNOSIS — M4716 Other spondylosis with myelopathy, lumbar region: Secondary | ICD-10-CM | POA: Diagnosis not present

## 2024-11-19 DIAGNOSIS — M47812 Spondylosis without myelopathy or radiculopathy, cervical region: Secondary | ICD-10-CM | POA: Diagnosis not present

## 2024-11-19 DIAGNOSIS — M5459 Other low back pain: Secondary | ICD-10-CM | POA: Insufficient documentation

## 2024-11-19 NOTE — Therapy (Signed)
 " OUTPATIENT PHYSICAL THERAPY THORACOLUMBAR EVALUATION   Patient Name: Brandon [name] MRN: [id number] DOB:11-22-1950, 74 y.o., male Today's Date: 11/19/2024  END OF SESSION:  PT End of Session - 11/19/24 1446     Visit Number 1    Number of Visits 13    Date for Recertification  12/31/24    Authorization Type medicare AB    Progress Note Due on Visit 10    PT Start Time 1446    PT Stop Time 1532    PT Time Calculation (min) 46 min          Past Medical History:  Diagnosis Date   Arthritis    ED (erectile dysfunction)    GERD (gastroesophageal reflux disease)    Hepatitis C    [phone], treated at [facility]   Hyperlipidemia    Hypertension    Nose fracture    Past Surgical History:  Procedure Laterality Date   FOOT SURGERY Right    1980   FOOT SURGERY Left    1980   HERNIA REPAIR  2015   INGUINAL HERNIA REPAIR Right    2009   Patient Active Problem List   Diagnosis Date Noted   Recurrent major depressive episodes, moderate (HCC) 08/26/2024   Seasonal allergies 02/18/2023   Constipation 02/18/2023   Spinal stenosis of lumbar region 09/07/2022   Tingling in extremities 10/04/2020   Tinnitus of both ears 10/04/2020   Deviated septum 10/04/2020   Low ferritin 08/04/2019   Delta beta thalassemia (HCC) 04/16/2019   Asthma 03/10/2018   Primary osteoarthritis of right hand 01/30/2018   Migraine with aura and with status migrainosus, not intractable 11/21/2017   Recurrent genital herpes simplex 11/21/2017   Eosinophilic esophagitis 09/03/2017   Lumbar spondylosis with myelopathy 06/14/2017   OSA on CPAP 05/16/2017   Hearing loss 01/31/2017   Pulmonary nodule 01/31/2017   Kidney lesion, native, left 01/31/2017   Chronic cough 04/19/2016   Insomnia 11/18/2014   Gout 12/03/2013   Onychomycosis 12/03/2013   B12 deficiency 12/03/2013   Vitamin D deficiency 12/03/2013   Liver fibrosis 12/03/2013   Essential hypertension, benign 10/29/2013   History of  hepatitis C virus infection 10/29/2013   GERD (gastroesophageal reflux disease) 10/29/2013   Hyperlipidemia 10/29/2013   BPH (benign prostatic hyperplasia) 10/29/2013   ED (erectile dysfunction) 10/29/2013   Hemorrhoid 10/29/2013   HSV antigen DIF positive 11/27/2011   Sciatic neuropathy 08/10/2011   Pain of lumbosacral spine 08/10/2011   Hepatitis C antibody test positive 08/10/2011   Chronic prostatitis without hematuria 08/10/2011    PCP: Alvan Dorothyann BIRCH, MD  REFERRING PROVIDER: Lowella Benton CROME, PA  REFERRING DIAG: [id number] (ICD-10-CM) - Cervical spine arthritis with nerve pain M47.16 (ICD-10-CM) - Lumbar spondylosis with myelopathy M79.18 (ICD-10-CM) - Myofascial pain syndrome of neck  Rationale for Evaluation and Treatment: Rehabilitation  THERAPY DIAG:  Cervicalgia  Other low back pain  Muscle weakness (generalized)  ONSET DATE: chronic, worsening over past month or so  SUBJECTIVE:  SUBJECTIVE STATEMENT: Pt endorses longstanding history of back pain since an injury while he was in the [employer], states he fell while wrestling with a friend and cracked his tailbone. Has dealt with back pain since, but states last year he began to develop L sided groin and LE pain for which he sought PT, describes good relief with stretching and strengthening. Also reports L sided neck pain around that time as well. He reports tingling localized in both hands and feet, no proximal N/T. States this also began about a year ago but improved w/ PT, has since returned with recent exacerbation about a month ago. Does not recall any precipitating factors for this episode but states this feels consistent with his prior presentation. Feels stiff, has to turn his whole body to twist/rotate. Tries to stay  active with home exercises including walking and resistance training but has had to defer this due to pain. He does note he has tried to perform prior PT HEP with some pain.  He reports history of headaches d/t sinus issues, states with this episode he will sometimes get suboccipital headaches but not frequently. Denies: diplopia, speech/swallowing issues.  He states sometimes he will have to drag his leg but attributes to pain more so than weakness. He denies: fevers/chills, recent bowel/bladder changes, saddle anesthesia, unexpected weight loss.  Leaving for a trip February 24th through March 12 and hoping to feel more mobile by then.   PERTINENT HISTORY:  Hx hep c, GERD, HTN, gout, insomnia, OSA w CPAP, migraine Reports hx of prior sacral fracture  PAIN:  Are you having pain: 8-9/10 neck when turning head Location/description: L> R belt, will precede groin pain on L. Deep pani in L hip. L neck along upper trap, sometimes into elbow Best-worst over past week: up to 10  - aggravating factors: walking, turning head to L, mornings, exercising, standing, washing dishes, walking in community - Easing factors: gentle movement, medication    PRECAUTIONS: None  RED FLAGS: None   WEIGHT BEARING RESTRICTIONS: No  FALLS:  Has patient fallen in last 6 months? No  OCCUPATION: retired  PLOF: Independent - enjoys exercising and staying active   PATIENT GOALS:  feel better for trip in February, be able to walk without pain  NEXT MD VISIT: after PT   OBJECTIVE:  Note: Objective measures were completed at Evaluation unless otherwise noted.  DIAGNOSTIC FINDINGS:  Cervical XR 11/18/24: IMPRESSION: 1. No acute findings. 2. Slight reversal of the normal cervical lordosis. 3. Multilevel degenerative disc disease, worst at C5-6 and C6-7.  Lumbar XR 11/18/24: IMPRESSION: 1. Lower lumbar degenerative disc disease, worst at L4-5 and L5-S1. 2. Straightening of the normal lumbar  lordosis.   PATIENT SURVEYS:  ODI: 21/50; 42%   COGNITION: Overall cognitive status: Within functional limits for tasks assessed     SENSATION: Reports tingling in BIL hands and feet at times, none currently Light touch intact all extremities Hoffman/tromner unremarkable BIL No clonus either LE  CERVICAL ROM:    A/PROM  eval  Flexion WNL *   Extension < 25% *  Right lateral flexion   Left lateral flexion   Right rotation 35 deg  Left rotation 25 deg * with referral into elbow    (Blank rows = not tested) (Key: WFL = within functional limits not formally assessed, * = concordant pain, s = stiffness/stretching sensation, NT = not tested)  Comments:    LUMBAR ROM:   AROM eval  Flexion Mid shin LBP  Extension  25% LBP and groin pain   Right lateral flexion   Left lateral flexion   Right rotation 25% s  Left rotation 50% s   (Blank rows = not tested) (Key: WFL = within functional limits not formally assessed, * = concordant pain, s = stiffness/stretching sensation, NT = not tested) Comment:   LOWER EXTREMITY ROM:      Right eval Left eval  Hip flexion    Hip extension    Hip internal rotation A: 25 deg A: 20 deg before pain, painful after  Hip external rotation    Knee extension    Knee flexion    (Blank rows = not tested) (Key: WFL = within functional limits not formally assessed, * = concordant pain, s = stiffness/stretching sensation, NT = not tested)  Comments:     UPPER EXTREMITY MMT:  MMT Right eval Left eval  Shoulder flexion 5 5  Shoulder extension    Shoulder abduction 5 5  Shoulder extension    Shoulder internal rotation    Shoulder external rotation    Elbow flexion    Elbow extension    Grip strength    (Blank rows = not tested)  (Key: WFL = within functional limits not formally assessed, * = concordant pain, s = stiffness/stretching sensation, NT = not tested)  Comments:   LOWER EXTREMITY MMT:    MMT Right eval Left eval  Hip  flexion 4- 3+ *  Hip abduction (modified sitting)    Hip internal rotation 5 5  Hip external rotation 4* (L sided) 5  Knee flexion 4+ 4 *  Knee extension 4+ 4  Ankle dorsiflexion 5 5   (Blank rows = not tested) (Key: WFL = within functional limits not formally assessed, * = concordant pain, s = stiffness/stretching sensation, NT = not tested)  Comments:      FUNCTIONAL TESTS:  5xSTS: 18.91sec no UE support, groin and back pain    GAIT: Distance walked: within clinic Assistive device utilized: None Level of assistance: Complete Independence Comments: antalgic gait pattern LLE, trunk rotation towards R, fwd flexed posture   TREATMENT DATE:  [facility] Adult PT Treatment:                                                DATE: 11/19/24 Therapeutic Exercise: HEP practice + education/handout   Self Care: Education/discussion re: exam findings as they relate to symptom behavior, relevant anatomy/physiology and rationale for interventions, activity modification as indicated                                                                                                                                    PATIENT EDUCATION:  Education details: Pt education on PT impairments, prognosis, and POC. Informed consent. Rationale for interventions, safe/appropriate HEP  performance Person educated: Patient Education method: Explanation, Demonstration, Tactile cues, Verbal cues Education comprehension: verbalized understanding, returned demonstration, verbal cues required, tactile cues required, and needs further education    HOME EXERCISE PROGRAM: Access Code: [email or web address] URL: [email or web address] Date: 11/19/2024 Prepared by: Alm Jenny  Exercises - Standing March with Counter Support  - 2-3 x daily - 1 sets - 8-10 reps - Standing Hip Abduction with Unilateral Counter Support  - 2-3 x daily - 1 sets - 8-10 reps  ASSESSMENT:  CLINICAL IMPRESSION: Patient is a pleasant 74 y.o.  gentleman who was seen today for physical therapy evaluation and treatment for neck/back over past month, acute on chronic. Increases with turning, standing, walking, and is affecting usual daily tasks, limiting ability to participate in usual exercise program. No red flags today. On exam demonstrates concordant limitations in cervical/lumbar mobility, hip strength, and altered gait/transfer kinematics. 5xSTS indicative of fall risk and provocative for concordant pain. Tolerates exam/HEP well without adverse event or reported increase in pain. Recommend trial of skilled PT to address aforementioned deficits with aim of improving functional tolerance and reducing pain with typical activities. Pt departs today's session in no acute distress, all voiced concerns/questions addressed appropriately from PT perspective.    OBJECTIVE IMPAIRMENTS: Abnormal gait, decreased activity tolerance, decreased balance, decreased endurance, decreased mobility, difficulty walking, decreased ROM, decreased strength, hypomobility, impaired perceived functional ability, improper body mechanics, and pain.   ACTIVITY LIMITATIONS: carrying, lifting, bending, standing, squatting, stairs, transfers, and locomotion level  PARTICIPATION LIMITATIONS: meal prep, cleaning, laundry, community activity, and occupation  PERSONAL FACTORS: Age, Time since onset of injury/illness/exacerbation, and 3+ comorbidities: HTN, gout, insomnia, OSA w CPAP, migraine are also affecting patient's functional outcome.   REHAB POTENTIAL: Good  CLINICAL DECISION MAKING: Evolving/moderate complexity  EVALUATION COMPLEXITY: Moderate   GOALS:  SHORT TERM GOALS: Target date: 12/10/2024  Pt will demonstrate appropriate understanding and performance of initially prescribed HEP in order to facilitate improved independence with management of symptoms.  Baseline: HEP established  Goal status: INITIAL   2. Pt will report at least 25% improvement in overall  pain levels over past week in order to facilitate improved tolerance to typical daily activities.   Baseline: up to 10/10  Goal status: INITIAL  3. Pt will endorse ability to walk for up to 20 min without increase in resting pain to improve community ambulation. Baseline: difficulty/avoidance of community ambulation (I.e grocery store) Goal status: INITIAL     LONG TERM GOALS: Target date: 12/31/2024  Pt will improve at least 20% on ODI in order to demonstrate improved perception of functional status due to symptoms.  Baseline: 42% Goal status: INITIAL  2.  Pt will demonstrate symmetrical and painless lumbar rotation AROM in order to demonstrate improved tolerance to functional movement patterns.  Baseline: see ROM chart above Goal status: INITIAL  3.  Pt will demonstrate BIL LE MMT of at least 4+/5 in tested groups in order to demonstrate improved strength for functional movements.  Baseline: see MMT chart above Goal status: INITIAL  4. Pt will perform 5xSTS in </= 12 sec in order to demonstrate reduced fall risk and improved functional independence. (MCID of 2.3sec)  Baseline: 18sec no UE support, painful  Goal status: INITIAL   5. Pt will demonstrate cervical rotation AROM of at least 45 deg BIL in order to demonstrate improved environmental awareness and safety w/ driving. Baseline: see ROM chart above Goal status: INITIAL  6. Pt will report at least 50% decrease in overall pain  levels in past week in order to facilitate improved tolerance to basic ADLs/mobility.   Baseline: up to 10/10  Goal status: INITIAL    PLAN:  PT FREQUENCY: 2x/week  PT DURATION: 6 weeks  PLANNED INTERVENTIONS: 97164- PT Re-evaluation, 97750- Physical Performance Testing, 97110-Therapeutic exercises, 97530- Therapeutic activity, 97112- Neuromuscular re-education, 97535- Self Care, [id number]- Manual therapy, [phone]- Gait training, [phone]- Aquatic Therapy, [phone]- Electrical stimulation (unattended), [phone]  (1-2 muscles), [id number] (3+ muscles)- Dry Needling, Patient/Family education, Balance training, Stair training, Taping, Joint mobilization, Spinal mobilization, Cryotherapy, and Moist heat.  PLAN FOR NEXT SESSION: Review/update HEP PRN. Further hip assessment given IR limitations and antalgic gait. Work on core/hip stability, periscapular/cervical stability. Gentle lumbar/cervical mobility within pt tolerance. Symptom modification strategies as indicated/appropriate.    Alm DELENA Jenny PT, DPT 11/19/2024 4:38 PM  "

## 2024-11-20 ENCOUNTER — Telehealth: Payer: Self-pay | Admitting: Pharmacy Technician

## 2024-11-20 ENCOUNTER — Other Ambulatory Visit (HOSPITAL_COMMUNITY): Payer: Self-pay

## 2024-11-20 NOTE — Telephone Encounter (Signed)
 Pharmacy Patient Advocate Encounter   Received notification from [facility] KEY that prior authorization for Lidocaine  5% (Lidoderm ) patch is required/requested.   Insurance verification completed.   The patient is insured through [facility].   Per test claim: The current 30 day co-pay is, $0.00.  No PA needed at this time. This test claim was processed through [facility]- copay amounts may vary at other pharmacies due to pharmacy/plan contracts, or as the patient moves through the different stages of their insurance plan.

## 2024-11-24 ENCOUNTER — Ambulatory Visit: Payer: Self-pay | Admitting: Physical Therapy

## 2024-11-24 ENCOUNTER — Encounter: Payer: Self-pay | Admitting: Physical Therapy

## 2024-11-24 DIAGNOSIS — M542 Cervicalgia: Secondary | ICD-10-CM

## 2024-11-24 DIAGNOSIS — M5459 Other low back pain: Secondary | ICD-10-CM

## 2024-11-24 DIAGNOSIS — M6281 Muscle weakness (generalized): Secondary | ICD-10-CM

## 2024-11-24 NOTE — Therapy (Signed)
 " OUTPATIENT PHYSICAL THERAPY TREATMENT   Patient Name: Brandon [name] MRN: [id number] DOB:Jul 14, 1951, 74 y.o., male Today's Date: 11/24/2024  END OF SESSION:  PT End of Session - 11/24/24 1006     Visit Number 2    Number of Visits 13    Date for Recertification  12/31/24    Authorization Type medicare AB    Progress Note Due on Visit 10    PT Start Time 1010    PT Stop Time 1049    PT Time Calculation (min) 39 min           Past Medical History:  Diagnosis Date   Arthritis    ED (erectile dysfunction)    GERD (gastroesophageal reflux disease)    Hepatitis C    [phone], treated at [facility]   Hyperlipidemia    Hypertension    Nose fracture    Past Surgical History:  Procedure Laterality Date   FOOT SURGERY Right    1980   FOOT SURGERY Left    1980   HERNIA REPAIR  2015   INGUINAL HERNIA REPAIR Right    2009   Patient Active Problem List   Diagnosis Date Noted   Recurrent major depressive episodes, moderate (HCC) 08/26/2024   Seasonal allergies 02/18/2023   Constipation 02/18/2023   Spinal stenosis of lumbar region 09/07/2022   Tingling in extremities 10/04/2020   Tinnitus of both ears 10/04/2020   Deviated septum 10/04/2020   Low ferritin 08/04/2019   Delta beta thalassemia (HCC) 04/16/2019   Asthma 03/10/2018   Primary osteoarthritis of right hand 01/30/2018   Migraine with aura and with status migrainosus, not intractable 11/21/2017   Recurrent genital herpes simplex 11/21/2017   Eosinophilic esophagitis 09/03/2017   Lumbar spondylosis with myelopathy 06/14/2017   OSA on CPAP 05/16/2017   Hearing loss 01/31/2017   Pulmonary nodule 01/31/2017   Kidney lesion, native, left 01/31/2017   Chronic cough 04/19/2016   Insomnia 11/18/2014   Gout 12/03/2013   Onychomycosis 12/03/2013   B12 deficiency 12/03/2013   Vitamin D deficiency 12/03/2013   Liver fibrosis 12/03/2013   Essential hypertension, benign 10/29/2013   History of hepatitis C virus  infection 10/29/2013   GERD (gastroesophageal reflux disease) 10/29/2013   Hyperlipidemia 10/29/2013   BPH (benign prostatic hyperplasia) 10/29/2013   ED (erectile dysfunction) 10/29/2013   Hemorrhoid 10/29/2013   HSV antigen DIF positive 11/27/2011   Sciatic neuropathy 08/10/2011   Pain of lumbosacral spine 08/10/2011   Hepatitis C antibody test positive 08/10/2011   Chronic prostatitis without hematuria 08/10/2011    PCP: Alvan Dorothyann BIRCH, MD  REFERRING PROVIDER: Lowella Benton CROME, PA  REFERRING DIAG: [phone] (ICD-10-CM) - Cervical spine arthritis with nerve pain M47.16 (ICD-10-CM) - Lumbar spondylosis with myelopathy M79.18 (ICD-10-CM) - Myofascial pain syndrome of neck  Rationale for Evaluation and Treatment: Rehabilitation  THERAPY DIAG:  Cervicalgia  Other low back pain  Muscle weakness (generalized)  ONSET DATE: chronic, worsening over past month or so  SUBJECTIVE:  Per eval: Pt endorses longstanding history of back pain since an injury while he was in the [employer], states he fell while wrestling with a friend and cracked his tailbone. Has dealt with back pain since, but states last year he began to develop L sided groin and LE pain for which he sought PT, describes good relief with stretching and strengthening. Also reports L sided neck pain around that time as well. He reports tingling localized in both hands and feet, no proximal N/T. States this also began about a year ago but improved w/ PT, has since returned with recent exacerbation about a month ago. Does not recall any precipitating factors for this episode but states this feels consistent with his prior presentation. Feels stiff, has to turn his whole body to twist/rotate. Tries to stay active with home exercises including  walking and resistance training but has had to defer this due to pain. He does note he has tried to perform prior PT HEP with some pain.  He reports history of headaches d/t sinus issues, states with this episode he will sometimes get suboccipital headaches but not frequently. Denies: diplopia, speech/swallowing issues.  He states sometimes he will have to drag his leg but attributes to pain more so than weakness. He denies: fevers/chills, recent bowel/bladder changes, saddle anesthesia, unexpected weight loss.  Leaving for a trip February 24th through March 12 and hoping to feel more mobile by then.   SUBJECTIVE STATEMENT: 11/24/2024: feeling a little better. Has been doing HEP, feels comfortable. Had a little numbness/tingling over the weekend but transient. No headache at present. No other new updates.    PERTINENT HISTORY:  Hx hep c, GERD, HTN, gout, insomnia, OSA w CPAP, migraine Reports hx of prior sacral fracture  PAIN:  Are you having pain: 6/10 belt line, 7/10 neck pain at present  Per eval:  Location/description: L> R belt, will precede groin pain on L. Deep pain in L hip. L neck along upper trap, sometimes into elbow Best-worst over past week: up to 10  - aggravating factors: walking, turning head to L, mornings, exercising, standing, washing dishes, walking in community - Easing factors: gentle movement, medication    PRECAUTIONS: None  RED FLAGS: None   WEIGHT BEARING RESTRICTIONS: No  FALLS:  Has patient fallen in last 6 months? No  OCCUPATION: retired  PLOF: Independent - enjoys exercising and staying active   PATIENT GOALS:  feel better for trip in February, be able to walk without pain  NEXT MD VISIT: after PT   OBJECTIVE:  Note: Objective measures were completed at Evaluation unless otherwise noted.  DIAGNOSTIC FINDINGS:  Cervical XR 11/18/24: IMPRESSION: 1. No acute findings. 2. Slight reversal of the normal cervical lordosis. 3. Multilevel  degenerative disc disease, worst at C5-6 and C6-7.  Lumbar XR 11/18/24: IMPRESSION: 1. Lower lumbar degenerative disc disease, worst at L4-5 and L5-S1. 2. Straightening of the normal lumbar lordosis.   PATIENT SURVEYS:  ODI: 21/50; 42%   COGNITION: Overall cognitive status: Within functional limits for tasks assessed     SENSATION: Reports tingling in BIL hands and feet at times, none currently Light touch intact all extremities Hoffman/tromner unremarkable BIL No clonus either LE  CERVICAL ROM:    A/PROM  eval  Flexion WNL *   Extension < 25% *  Right lateral flexion   Left lateral flexion   Right rotation 35 deg  Left rotation 25 deg * with referral into elbow    (Blank rows = not  tested) (Key: WFL = within functional limits not formally assessed, * = concordant pain, s = stiffness/stretching sensation, NT = not tested)  Comments:    LUMBAR ROM:   AROM eval  Flexion Mid shin LBP  Extension 25% LBP and groin pain   Right lateral flexion   Left lateral flexion   Right rotation 25% s  Left rotation 50% s   (Blank rows = not tested) (Key: WFL = within functional limits not formally assessed, * = concordant pain, s = stiffness/stretching sensation, NT = not tested) Comment:   LOWER EXTREMITY ROM:      Right eval Left eval  Hip flexion    Hip extension    Hip internal rotation A: 25 deg A: 20 deg before pain, painful after  Hip external rotation    Knee extension    Knee flexion    (Blank rows = not tested) (Key: WFL = within functional limits not formally assessed, * = concordant pain, s = stiffness/stretching sensation, NT = not tested)  Comments:     UPPER EXTREMITY MMT:  MMT Right eval Left eval  Shoulder flexion 5 5  Shoulder extension    Shoulder abduction 5 5  Shoulder extension    Shoulder internal rotation    Shoulder external rotation    Elbow flexion    Elbow extension    Grip strength    (Blank rows = not tested)  (Key: WFL =  within functional limits not formally assessed, * = concordant pain, s = stiffness/stretching sensation, NT = not tested)  Comments:   LOWER EXTREMITY MMT:    MMT Right eval Left eval  Hip flexion 4- 3+ *  Hip abduction (modified sitting)    Hip internal rotation 5 5  Hip external rotation 4* (L sided) 5  Knee flexion 4+ 4 *  Knee extension 4+ 4  Ankle dorsiflexion 5 5   (Blank rows = not tested) (Key: WFL = within functional limits not formally assessed, * = concordant pain, s = stiffness/stretching sensation, NT = not tested)  Comments:      FUNCTIONAL TESTS:  5xSTS: 18.91sec no UE support, groin and back pain    GAIT: Distance walked: within clinic Assistive device utilized: None Level of assistance: Complete Independence Comments: antalgic gait pattern LLE, trunk rotation towards R, fwd flexed posture   SPECIAL TESTS:  11/25/23  - RLE FABER + for L LBP, LLE FABER +  - RLE FADDIR negative, LLE FADDIR +  - RLE SAD relieving, no effect LLE    TREATMENT DATE:   [facility] Adult PT Treatment:                                                DATE: 11/24/24 Therapeutic Exercise: Supine cervical rotation AROM 2x8 towards R, x5 to L, cues for comfortable ROM  Seated scap retraction x10 cues for form Seated double ER + scap retraction 2x10 cues for form and setup  HEP update + education/handout  Neuromuscular re-ed: Standing march x8 BIL Standing hip abd x8 BIL Standing donkey kick x8 BIL cues for glute/core activation Seated hip adduction iso w/ ball 2x10 cues for posture and breath control  Therapeutic Activity: Hip exam +education BIL shoulder flexion at wall (external cue for posture) 2x8  BIL shoulder abduction at wall (external cue for posture) 2x8  [facility] Adult PT Treatment:                                                DATE: 11/19/24 Therapeutic Exercise: HEP practice + education/handout   Self Care: Education/discussion re: exam findings as they relate to  symptom behavior, relevant anatomy/physiology and rationale for interventions, activity modification as indicated                                                                                                                                    PATIENT EDUCATION:  Education details: rationale for interventions, HEP  Person educated: Patient Education method: Explanation, Demonstration, Tactile cues, Verbal cues Education comprehension: verbalized understanding, returned demonstration, verbal cues required, tactile cues required, and needs further education     HOME EXERCISE PROGRAM: Access Code: [email or web address] URL: [email or web address] Date: 11/24/2024 Prepared by: Alm Jenny  Exercises - Standing March with Counter Support  - 2-3 x daily - 1 sets - 8-10 reps - Standing Hip Abduction with Unilateral Counter Support  - 2-3 x daily - 1 sets - 8-10 reps - Seated Hip Adduction Isometrics with Ball  - 2-3 x daily - 1 sets - 8-10 reps - Shoulder External Rotation and Scapular Retraction  - 2-3 x daily - 1 sets - 8-10 reps - Standing Shoulder Flexion Full Range  - 2-3 x daily - 1 sets - 8-10 reps  ASSESSMENT:  CLINICAL IMPRESSION: 11/24/2024: Pt arrives with report of modest improvement in symptoms. Hip exam is provocative, with RLE FABER provoking L sided LBP, L FADDIR concordant. Today we expand program to include more lumbopelvic stability work, armed forces technical officer mobility work within pt tolerance. R cervical rotation improves w/ repetition, L remains provocative and is discontinued. Otherwise tolerates session well overall, no adverse events. Tendency for improved ROM with repetition. Reports LBP down to 4/10 and neck pain down to 5/10 on departure. HEP update as above. Recommend continuing along current POC in order to address relevant deficits and improve functional tolerance. Pt departs today's session in no acute distress, all voiced questions/concerns addressed appropriately  from PT perspective.    Per eval: Patient is a pleasant 74 y.o. gentleman who was seen today for physical therapy evaluation and treatment for neck/back over past month, acute on chronic. Increases with turning, standing, walking, and is affecting usual daily tasks, limiting ability to participate in usual exercise program. No red flags today. On exam demonstrates concordant limitations in cervical/lumbar mobility, hip strength, and altered gait/transfer kinematics. 5xSTS indicative of fall risk and provocative for concordant pain. Tolerates exam/HEP well without adverse event or reported increase in pain. Recommend trial of skilled PT to address aforementioned deficits with aim of improving functional tolerance and reducing pain with typical activities. Pt departs today's session in no acute distress, all voiced  concerns/questions addressed appropriately from PT perspective.    OBJECTIVE IMPAIRMENTS: Abnormal gait, decreased activity tolerance, decreased balance, decreased endurance, decreased mobility, difficulty walking, decreased ROM, decreased strength, hypomobility, impaired perceived functional ability, improper body mechanics, and pain.   ACTIVITY LIMITATIONS: carrying, lifting, bending, standing, squatting, stairs, transfers, and locomotion level  PARTICIPATION LIMITATIONS: meal prep, cleaning, laundry, community activity, and occupation  PERSONAL FACTORS: Age, Time since onset of injury/illness/exacerbation, and 3+ comorbidities: HTN, gout, insomnia, OSA w CPAP, migraine are also affecting patient's functional outcome.   REHAB POTENTIAL: Good  CLINICAL DECISION MAKING: Evolving/moderate complexity  EVALUATION COMPLEXITY: Moderate   GOALS:  SHORT TERM GOALS: Target date: 12/10/2024  Pt will demonstrate appropriate understanding and performance of initially prescribed HEP in order to facilitate improved independence with management of symptoms.  Baseline: HEP established  Goal status:  INITIAL   2. Pt will report at least 25% improvement in overall pain levels over past week in order to facilitate improved tolerance to typical daily activities.   Baseline: up to 10/10  Goal status: INITIAL  3. Pt will endorse ability to walk for up to 20 min without increase in resting pain to improve community ambulation. Baseline: difficulty/avoidance of community ambulation (I.e grocery store) Goal status: INITIAL     LONG TERM GOALS: Target date: 12/31/2024  Pt will improve at least 20% on ODI in order to demonstrate improved perception of functional status due to symptoms.  Baseline: 42% Goal status: INITIAL  2.  Pt will demonstrate symmetrical and painless lumbar rotation AROM in order to demonstrate improved tolerance to functional movement patterns.  Baseline: see ROM chart above Goal status: INITIAL  3.  Pt will demonstrate BIL LE MMT of at least 4+/5 in tested groups in order to demonstrate improved strength for functional movements.  Baseline: see MMT chart above Goal status: INITIAL  4. Pt will perform 5xSTS in </= 12 sec in order to demonstrate reduced fall risk and improved functional independence. (MCID of 2.3sec)  Baseline: 18sec no UE support, painful  Goal status: INITIAL   5. Pt will demonstrate cervical rotation AROM of at least 45 deg BIL in order to demonstrate improved environmental awareness and safety w/ driving. Baseline: see ROM chart above Goal status: INITIAL  6. Pt will report at least 50% decrease in overall pain levels in past week in order to facilitate improved tolerance to basic ADLs/mobility.   Baseline: up to 10/10  Goal status: INITIAL    PLAN:  PT FREQUENCY: 2x/week  PT DURATION: 6 weeks  PLANNED INTERVENTIONS: [id number]- PT Re-evaluation, 97750- Physical Performance Testing, 97110-Therapeutic exercises, 97530- Therapeutic activity, 97112- Neuromuscular re-education, 97535- Self Care, [id number]- Manual therapy, [phone]- Gait training, [phone]-  Aquatic Therapy, [phone]- Electrical stimulation (unattended), [phone] (1-2 muscles), [phone] (3+ muscles)- Dry Needling, Patient/Family education, Balance training, Stair training, Taping, Joint mobilization, Spinal mobilization, Cryotherapy, and Moist heat.  PLAN FOR NEXT SESSION: Review/update HEP PRN. Work on core/hip stability, periscapular/cervical stability. Gentle lumbar/cervical mobility within pt tolerance. Symptom modification strategies as indicated/appropriate.    Alm DELENA Jenny PT, DPT 11/24/2024 10:52 AM  "

## 2024-11-26 ENCOUNTER — Ambulatory Visit: Payer: Self-pay

## 2024-11-26 DIAGNOSIS — M6281 Muscle weakness (generalized): Secondary | ICD-10-CM

## 2024-11-26 DIAGNOSIS — M542 Cervicalgia: Secondary | ICD-10-CM

## 2024-11-26 DIAGNOSIS — M5459 Other low back pain: Secondary | ICD-10-CM

## 2024-11-26 NOTE — Therapy (Signed)
 " OUTPATIENT PHYSICAL THERAPY TREATMENT   Patient Name: Brandon [name] MRN: [id number] DOB:06/13/1951, 74 y.o., male Today's Date: 11/26/2024  END OF SESSION:  PT End of Session - 11/26/24 1150     Visit Number 3    Number of Visits 13    Date for Recertification  12/31/24    Authorization Type medicare AB    Progress Note Due on Visit 10    PT Start Time 1150   late check in   PT Stop Time 1232    PT Time Calculation (min) 42 min    Activity Tolerance Patient tolerated treatment well    Behavior During Therapy WFL for tasks assessed/performed         Past Medical History:  Diagnosis Date   Arthritis    ED (erectile dysfunction)    GERD (gastroesophageal reflux disease)    Hepatitis C    [phone], treated at [facility]   Hyperlipidemia    Hypertension    Nose fracture    Past Surgical History:  Procedure Laterality Date   FOOT SURGERY Right    1980   FOOT SURGERY Left    1980   HERNIA REPAIR  2015   INGUINAL HERNIA REPAIR Right    2009   Patient Active Problem List   Diagnosis Date Noted   Recurrent major depressive episodes, moderate (HCC) 08/26/2024   Seasonal allergies 02/18/2023   Constipation 02/18/2023   Spinal stenosis of lumbar region 09/07/2022   Tingling in extremities 10/04/2020   Tinnitus of both ears 10/04/2020   Deviated septum 10/04/2020   Low ferritin 08/04/2019   Delta beta thalassemia (HCC) 04/16/2019   Asthma 03/10/2018   Primary osteoarthritis of right hand 01/30/2018   Migraine with aura and with status migrainosus, not intractable 11/21/2017   Recurrent genital herpes simplex 11/21/2017   Eosinophilic esophagitis 09/03/2017   Lumbar spondylosis with myelopathy 06/14/2017   OSA on CPAP 05/16/2017   Hearing loss 01/31/2017   Pulmonary nodule 01/31/2017   Kidney lesion, native, left 01/31/2017   Chronic cough 04/19/2016   Insomnia 11/18/2014   Gout 12/03/2013   Onychomycosis 12/03/2013   B12 deficiency 12/03/2013   Vitamin D  deficiency 12/03/2013   Liver fibrosis 12/03/2013   Essential hypertension, benign 10/29/2013   History of hepatitis C virus infection 10/29/2013   GERD (gastroesophageal reflux disease) 10/29/2013   Hyperlipidemia 10/29/2013   BPH (benign prostatic hyperplasia) 10/29/2013   ED (erectile dysfunction) 10/29/2013   Hemorrhoid 10/29/2013   HSV antigen DIF positive 11/27/2011   Sciatic neuropathy 08/10/2011   Pain of lumbosacral spine 08/10/2011   Hepatitis C antibody test positive 08/10/2011   Chronic prostatitis without hematuria 08/10/2011    PCP: Alvan Dorothyann BIRCH, MD  REFERRING PROVIDER: Lowella Benton CROME, PA  REFERRING DIAG: [phone] (ICD-10-CM) - Cervical spine arthritis with nerve pain M47.16 (ICD-10-CM) - Lumbar spondylosis with myelopathy M79.18 (ICD-10-CM) - Myofascial pain syndrome of neck  Rationale for Evaluation and Treatment: Rehabilitation  THERAPY DIAG:  Cervicalgia  Other low back pain  Muscle weakness (generalized)  ONSET DATE: chronic, worsening over past month or so  SUBJECTIVE:  Per eval: Pt endorses longstanding history of back pain since an injury while he was in the [employer], states he fell while wrestling with a friend and cracked his tailbone. Has dealt with back pain since, but states last year he began to develop L sided groin and LE pain for which he sought PT, describes good relief with stretching and strengthening. Also reports L sided neck pain around that time as well. He reports tingling localized in both hands and feet, no proximal N/T. States this also began about a year ago but improved w/ PT, has since returned with recent exacerbation about a month ago. Does not recall any precipitating factors for this episode but states this feels consistent with his  prior presentation. Feels stiff, has to turn his whole body to twist/rotate. Tries to stay active with home exercises including walking and resistance training but has had to defer this due to pain. He does note he has tried to perform prior PT HEP with some pain.  He reports history of headaches d/t sinus issues, states with this episode he will sometimes get suboccipital headaches but not frequently. Denies: diplopia, speech/swallowing issues.  He states sometimes he will have to drag his leg but attributes to pain more so than weakness. He denies: fevers/chills, recent bowel/bladder changes, saddle anesthesia, unexpected weight loss.  Leaving for a trip February 24th through March 12 and hoping to feel more mobile by then.   SUBJECTIVE STATEMENT: Patient reports his neck is stiff today, especially on Lt side of neck and has a sinus headache that he woke up with. States he has pain on Lt side low back on Lt side.    PERTINENT HISTORY:  Hx hep c, GERD, HTN, gout, insomnia, OSA w CPAP, migraine Reports hx of prior sacral fracture  PAIN:  Are you having pain: 6/10 belt line, 7/10 neck pain at present  Per eval:  Location/description: L> R belt, will precede groin pain on L. Deep pain in L hip. L neck along upper trap, sometimes into elbow Best-worst over past week: up to 10  - aggravating factors: walking, turning head to L, mornings, exercising, standing, washing dishes, walking in community - Easing factors: gentle movement, medication    PRECAUTIONS: None  RED FLAGS: None   WEIGHT BEARING RESTRICTIONS: No  FALLS:  Has patient fallen in last 6 months? No  OCCUPATION: retired  PLOF: Independent - enjoys exercising and staying active   PATIENT GOALS:  feel better for trip in February, be able to walk without pain  NEXT MD VISIT: after PT   OBJECTIVE:  Note: Objective measures were completed at Evaluation unless otherwise noted.  DIAGNOSTIC FINDINGS:  Cervical XR  11/18/24: IMPRESSION: 1. No acute findings. 2. Slight reversal of the normal cervical lordosis. 3. Multilevel degenerative disc disease, worst at C5-6 and C6-7.  Lumbar XR 11/18/24: IMPRESSION: 1. Lower lumbar degenerative disc disease, worst at L4-5 and L5-S1. 2. Straightening of the normal lumbar lordosis.   PATIENT SURVEYS:  ODI: 21/50; 42%   COGNITION: Overall cognitive status: Within functional limits for tasks assessed     SENSATION: Reports tingling in BIL hands and feet at times, none currently Light touch intact all extremities Hoffman/tromner unremarkable BIL No clonus either LE  CERVICAL ROM:    A/PROM  eval  Flexion WNL *   Extension < 25% *  Right lateral flexion   Left lateral flexion   Right rotation 35 deg  Left rotation 25 deg * with referral into elbow    (  Blank rows = not tested) (Key: WFL = within functional limits not formally assessed, * = concordant pain, s = stiffness/stretching sensation, NT = not tested)  Comments:    LUMBAR ROM:   AROM eval  Flexion Mid shin LBP  Extension 25% LBP and groin pain   Right lateral flexion   Left lateral flexion   Right rotation 25% s  Left rotation 50% s   (Blank rows = not tested) (Key: WFL = within functional limits not formally assessed, * = concordant pain, s = stiffness/stretching sensation, NT = not tested) Comment:   LOWER EXTREMITY ROM:      Right eval Left eval  Hip flexion    Hip extension    Hip internal rotation A: 25 deg A: 20 deg before pain, painful after  Hip external rotation    Knee extension    Knee flexion    (Blank rows = not tested) (Key: WFL = within functional limits not formally assessed, * = concordant pain, s = stiffness/stretching sensation, NT = not tested)  Comments:     UPPER EXTREMITY MMT:  MMT Right eval Left eval  Shoulder flexion 5 5  Shoulder extension    Shoulder abduction 5 5  Shoulder extension    Shoulder internal rotation    Shoulder  external rotation    Elbow flexion    Elbow extension    Grip strength    (Blank rows = not tested)  (Key: WFL = within functional limits not formally assessed, * = concordant pain, s = stiffness/stretching sensation, NT = not tested)  Comments:   LOWER EXTREMITY MMT:    MMT Right eval Left eval  Hip flexion 4- 3+ *  Hip abduction (modified sitting)    Hip internal rotation 5 5  Hip external rotation 4* (L sided) 5  Knee flexion 4+ 4 *  Knee extension 4+ 4  Ankle dorsiflexion 5 5   (Blank rows = not tested) (Key: WFL = within functional limits not formally assessed, * = concordant pain, s = stiffness/stretching sensation, NT = not tested)  Comments:      FUNCTIONAL TESTS:  5xSTS: 18.91sec no UE support, groin and back pain    GAIT: Distance walked: within clinic Assistive device utilized: None Level of assistance: Complete Independence Comments: antalgic gait pattern LLE, trunk rotation towards R, fwd flexed posture   SPECIAL TESTS:  11/25/23  - RLE FABER + for L LBP, LLE FABER +  - RLE FADDIR negative, LLE FADDIR +  - RLE SAD relieving, no effect LLE    TREATMENT DATE:  [facility] Adult PT Treatment:                                                DATE: 11/26/2024 Therapeutic Exercise: Upper trap stretch with arm reaching down to floor 5 x 10 sec Assisted cervical rotation with pillowcase (bil) Levator scapula stretch Neuromuscular re-ed: Shoulder ER + chest lift with scap retraction Supine: Shoulder horizontal abduction + red TB Hip adduction isometric ball squeeze  Hip abd press with isometric hold + blue TB Side lying: Clamshells x10 Reverse clamshells x10 Therapeutic Activity: Cervical nods + fingertip pressure at suboccipitals Wide leg hip IR/ER (supine wipers) x 2 min Supine on towel roll --> W arms Standing shoulder flexion + scap stabilization cues -->added towel roll at back for postural awareness  Standing hip abd 2x10    [facility] Adult PT Treatment:                                                 DATE: 11/24/24 Therapeutic Exercise: Supine cervical rotation AROM 2x8 towards R, x5 to L, cues for comfortable ROM  Seated scap retraction x10 cues for form Seated double ER + scap retraction 2x10 cues for form and setup  HEP update + education/handout  Neuromuscular re-ed: Standing march x8 BIL Standing hip abd x8 BIL Standing donkey kick x8 BIL cues for glute/core activation Seated hip adduction iso w/ ball 2x10 cues for posture and breath control  Therapeutic Activity: Hip exam +education BIL shoulder flexion at wall (external cue for posture) 2x8  BIL shoulder abduction at wall (external cue for posture) 2x8     [facility] Adult PT Treatment:                                                DATE: 11/19/24 Therapeutic Exercise: HEP practice + education/handout   Self Care: Education/discussion re: exam findings as they relate to symptom behavior, relevant anatomy/physiology and rationale for interventions, activity modification as indicated                                                                                                                                    PATIENT EDUCATION:  Education details: Updated HEP  Person educated: Patient Education method: Explanation, Demonstration, Tactile cues, Verbal cues Education comprehension: verbalized understanding, returned demonstration, verbal cues required, tactile cues required, and needs further education     HOME EXERCISE PROGRAM: Access Code: [email or web address] URL: [email or web address] Date: 11/26/2024 Prepared by: Lamarr Price  Exercises - Standing March with Counter Support  - 2-3 x daily - 1 sets - 8-10 reps - Standing Hip Abduction with Unilateral Counter Support  - 2-3 x daily - 1 sets - 8-10 reps - Seated Hip Adduction Isometrics with Ball  - 2-3 x daily - 1 sets - 8-10 reps - Shoulder External Rotation and Scapular Retraction  - 2-3 x daily - 1 sets - 8-10 reps -  Standing Shoulder Flexion Full Range  - 2-3 x daily - 1 sets - 8-10 reps - Sidelying Open Book  - 2-3 x daily - 1 sets - 10 reps - Clamshell  - 2-3 x daily - 1 sets - 10 reps - Sidelying Reverse Clamshell  - 2-3 x daily - 1 sets - 10 reps - Supine Hip Internal and External Rotation  - 2-3 x daily - 1 sets - 20 reps - Open Book Chest Stretch on Towel Roll  - 2-3 x daily -  1 sets - 10 reps - 5 sec hold  ASSESSMENT:  CLINICAL IMPRESSION: Progressed hip strengthening and cervical mobility exercises as tolerated; added new exercises to HEP. Good glute activation demonstrated during side lying hip rotation exercises. Thoracolumbar myofascial tightness/tension decreased with repetition during UE/LE trink rotation activities. Patient will continue to benefit from skilled therapy to address mobility and pain deficits.    Per eval: Patient is a pleasant 74 y.o. gentleman who was seen today for physical therapy evaluation and treatment for neck/back over past month, acute on chronic. Increases with turning, standing, walking, and is affecting usual daily tasks, limiting ability to participate in usual exercise program. No red flags today. On exam demonstrates concordant limitations in cervical/lumbar mobility, hip strength, and altered gait/transfer kinematics. 5xSTS indicative of fall risk and provocative for concordant pain. Tolerates exam/HEP well without adverse event or reported increase in pain. Recommend trial of skilled PT to address aforementioned deficits with aim of improving functional tolerance and reducing pain with typical activities. Pt departs today's session in no acute distress, all voiced concerns/questions addressed appropriately from PT perspective.    OBJECTIVE IMPAIRMENTS: Abnormal gait, decreased activity tolerance, decreased balance, decreased endurance, decreased mobility, difficulty walking, decreased ROM, decreased strength, hypomobility, impaired perceived functional ability,  improper body mechanics, and pain.   ACTIVITY LIMITATIONS: carrying, lifting, bending, standing, squatting, stairs, transfers, and locomotion level  PARTICIPATION LIMITATIONS: meal prep, cleaning, laundry, community activity, and occupation  PERSONAL FACTORS: Age, Time since onset of injury/illness/exacerbation, and 3+ comorbidities: HTN, gout, insomnia, OSA w CPAP, migraine are also affecting patient's functional outcome.   REHAB POTENTIAL: Good  CLINICAL DECISION MAKING: Evolving/moderate complexity  EVALUATION COMPLEXITY: Moderate   GOALS:  SHORT TERM GOALS: Target date: 12/10/2024  Pt will demonstrate appropriate understanding and performance of initially prescribed HEP in order to facilitate improved independence with management of symptoms.  Baseline: HEP established  Goal status: INITIAL   2. Pt will report at least 25% improvement in overall pain levels over past week in order to facilitate improved tolerance to typical daily activities.   Baseline: up to 10/10  Goal status: INITIAL  3. Pt will endorse ability to walk for up to 20 min without increase in resting pain to improve community ambulation. Baseline: difficulty/avoidance of community ambulation (I.e grocery store) Goal status: INITIAL     LONG TERM GOALS: Target date: 12/31/2024  Pt will improve at least 20% on ODI in order to demonstrate improved perception of functional status due to symptoms.  Baseline: 42% Goal status: INITIAL  2.  Pt will demonstrate symmetrical and painless lumbar rotation AROM in order to demonstrate improved tolerance to functional movement patterns.  Baseline: see ROM chart above Goal status: INITIAL  3.  Pt will demonstrate BIL LE MMT of at least 4+/5 in tested groups in order to demonstrate improved strength for functional movements.  Baseline: see MMT chart above Goal status: INITIAL  4. Pt will perform 5xSTS in </= 12 sec in order to demonstrate reduced fall risk and improved  functional independence. (MCID of 2.3sec)  Baseline: 18sec no UE support, painful  Goal status: INITIAL   5. Pt will demonstrate cervical rotation AROM of at least 45 deg BIL in order to demonstrate improved environmental awareness and safety w/ driving. Baseline: see ROM chart above Goal status: INITIAL  6. Pt will report at least 50% decrease in overall pain levels in past week in order to facilitate improved tolerance to basic ADLs/mobility.   Baseline: up to 10/10  Goal status: INITIAL    PLAN:  PT FREQUENCY: 2x/week  PT DURATION: 6 weeks  PLANNED INTERVENTIONS: 97164- PT Re-evaluation, 97750- Physical Performance Testing, 97110-Therapeutic exercises, 97530- Therapeutic activity, 97112- Neuromuscular re-education, 97535- Self Care, [id number]- Manual therapy, [phone]- Gait training, [phone]- Aquatic Therapy, [phone]- Electrical stimulation (unattended), [phone] (1-2 muscles), [phone] (3+ muscles)- Dry Needling, Patient/Family education, Balance training, Stair training, Taping, Joint mobilization, Spinal mobilization, Cryotherapy, and Moist heat.  PLAN FOR NEXT SESSION: Review/update HEP PRN. Work on core/hip stability, periscapular/cervical stability. Gentle lumbar/cervical mobility within pt tolerance. Symptom modification strategies as indicated/appropriate.    Lamarr Price, PTA 11/26/2024 12:36 PM  "

## 2024-12-01 ENCOUNTER — Ambulatory Visit: Payer: Self-pay | Admitting: Physical Therapy

## 2024-12-03 ENCOUNTER — Ambulatory Visit: Payer: Self-pay

## 2024-12-14 ENCOUNTER — Ambulatory Visit: Admitting: Neurology

## 2025-02-23 ENCOUNTER — Ambulatory Visit: Admitting: Family Medicine
